# Patient Record
Sex: Male | Born: 1950
Health system: Southern US, Community
[De-identification: ages and names within clinical notes are randomized; demographics above are authoritative.]

## PROBLEM LIST (undated history)

## (undated) DIAGNOSIS — E785 Hyperlipidemia, unspecified: Secondary | ICD-10-CM

## (undated) DIAGNOSIS — J302 Other seasonal allergic rhinitis: Secondary | ICD-10-CM

## (undated) DIAGNOSIS — I739 Peripheral vascular disease, unspecified: Secondary | ICD-10-CM

## (undated) DIAGNOSIS — I219 Acute myocardial infarction, unspecified: Secondary | ICD-10-CM

## (undated) DIAGNOSIS — J449 Chronic obstructive pulmonary disease, unspecified: Secondary | ICD-10-CM

## (undated) DIAGNOSIS — Z972 Presence of dental prosthetic device (complete) (partial): Secondary | ICD-10-CM

## (undated) DIAGNOSIS — Z973 Presence of spectacles and contact lenses: Secondary | ICD-10-CM

## (undated) DIAGNOSIS — K219 Gastro-esophageal reflux disease without esophagitis: Secondary | ICD-10-CM

## (undated) DIAGNOSIS — M1990 Unspecified osteoarthritis, unspecified site: Secondary | ICD-10-CM

## (undated) DIAGNOSIS — Z9289 Personal history of other medical treatment: Secondary | ICD-10-CM

## (undated) DIAGNOSIS — I2510 Atherosclerotic heart disease of native coronary artery without angina pectoris: Secondary | ICD-10-CM

## (undated) DIAGNOSIS — R42 Dizziness and giddiness: Secondary | ICD-10-CM

## (undated) DIAGNOSIS — J40 Bronchitis, not specified as acute or chronic: Secondary | ICD-10-CM

## (undated) DIAGNOSIS — I10 Essential (primary) hypertension: Secondary | ICD-10-CM

## (undated) HISTORY — PX: ACNE CYST REMOVAL: SUR1112

## (undated) HISTORY — PX: TONSILLECTOMY: SUR1361

## (undated) HISTORY — PX: CARDIAC CATHETERIZATION: SHX172

## (undated) HISTORY — PX: MULTIPLE TOOTH EXTRACTIONS: SHX2053

## (undated) HISTORY — PX: KNEE ARTHROSCOPY: SHX127

## (undated) HISTORY — DX: Dizziness and giddiness: R42

## (undated) HISTORY — DX: Hyperlipidemia, unspecified: E78.5

## (undated) HISTORY — PX: CORONARY STENT PLACEMENT: SHX1402

## (undated) HISTORY — DX: Personal history of other medical treatment: Z92.89

## (undated) HISTORY — PX: COLONOSCOPY: SHX174

---

## 2000-10-01 ENCOUNTER — Encounter: Admission: RE | Admit: 2000-10-01 | Discharge: 2000-10-01 | Payer: Self-pay | Admitting: Family Medicine

## 2000-10-01 ENCOUNTER — Encounter: Payer: Self-pay | Admitting: Family Medicine

## 2000-12-10 ENCOUNTER — Ambulatory Visit (HOSPITAL_COMMUNITY): Admission: RE | Admit: 2000-12-10 | Discharge: 2000-12-11 | Payer: Self-pay | Admitting: Cardiovascular Disease

## 2002-04-11 ENCOUNTER — Emergency Department (HOSPITAL_COMMUNITY): Admission: EM | Admit: 2002-04-11 | Discharge: 2002-04-11 | Payer: Self-pay | Admitting: Emergency Medicine

## 2003-03-17 ENCOUNTER — Inpatient Hospital Stay (HOSPITAL_COMMUNITY): Admission: EM | Admit: 2003-03-17 | Discharge: 2003-03-20 | Payer: Self-pay | Admitting: Emergency Medicine

## 2003-03-17 ENCOUNTER — Encounter: Payer: Self-pay | Admitting: Emergency Medicine

## 2004-11-07 ENCOUNTER — Ambulatory Visit: Payer: Self-pay | Admitting: Cardiology

## 2004-12-29 ENCOUNTER — Ambulatory Visit: Payer: Self-pay | Admitting: Cardiology

## 2005-09-07 HISTORY — PX: CORONARY ARTERY BYPASS GRAFT: SHX141

## 2006-06-08 ENCOUNTER — Inpatient Hospital Stay (HOSPITAL_COMMUNITY): Admission: EM | Admit: 2006-06-08 | Discharge: 2006-06-16 | Payer: Self-pay | Admitting: Emergency Medicine

## 2006-06-08 ENCOUNTER — Ambulatory Visit: Payer: Self-pay | Admitting: Cardiology

## 2006-06-09 ENCOUNTER — Encounter: Payer: Self-pay | Admitting: Vascular Surgery

## 2006-06-10 ENCOUNTER — Encounter: Payer: Self-pay | Admitting: Vascular Surgery

## 2006-06-17 ENCOUNTER — Encounter: Admission: RE | Admit: 2006-06-17 | Discharge: 2006-06-17 | Payer: Self-pay | Admitting: Cardiothoracic Surgery

## 2006-07-02 ENCOUNTER — Ambulatory Visit: Payer: Self-pay | Admitting: Cardiology

## 2006-07-09 ENCOUNTER — Ambulatory Visit: Payer: Self-pay | Admitting: Cardiology

## 2006-08-05 ENCOUNTER — Encounter
Admission: RE | Admit: 2006-08-05 | Discharge: 2006-08-05 | Payer: Self-pay | Admitting: Thoracic Surgery (Cardiothoracic Vascular Surgery)

## 2006-08-13 ENCOUNTER — Ambulatory Visit: Payer: Self-pay | Admitting: Cardiology

## 2006-09-07 ENCOUNTER — Encounter (INDEPENDENT_AMBULATORY_CARE_PROVIDER_SITE_OTHER): Payer: Self-pay | Admitting: *Deleted

## 2006-10-08 ENCOUNTER — Ambulatory Visit: Payer: Self-pay | Admitting: Cardiology

## 2006-10-11 ENCOUNTER — Encounter: Payer: Self-pay | Admitting: Cardiology

## 2006-10-11 ENCOUNTER — Ambulatory Visit: Payer: Self-pay

## 2006-10-14 ENCOUNTER — Ambulatory Visit: Payer: Self-pay | Admitting: Internal Medicine

## 2006-11-11 ENCOUNTER — Ambulatory Visit: Payer: Self-pay | Admitting: Internal Medicine

## 2006-11-11 LAB — CONVERTED CEMR LAB
Creatinine, Ser: 0.9 mg/dL (ref 0.4–1.5)
GFR calc Af Amer: 113 mL/min
Potassium: 4.8 meq/L (ref 3.5–5.1)
Sodium: 139 meq/L (ref 135–145)

## 2007-01-26 ENCOUNTER — Ambulatory Visit: Payer: Self-pay | Admitting: Internal Medicine

## 2007-02-15 ENCOUNTER — Ambulatory Visit: Payer: Self-pay | Admitting: Internal Medicine

## 2007-04-15 ENCOUNTER — Ambulatory Visit: Payer: Self-pay | Admitting: Cardiology

## 2007-04-18 ENCOUNTER — Ambulatory Visit: Payer: Self-pay | Admitting: Cardiology

## 2007-04-18 LAB — CONVERTED CEMR LAB
AST: 24 units/L (ref 0–37)
Albumin: 3.7 g/dL (ref 3.5–5.2)
Alkaline Phosphatase: 97 units/L (ref 39–117)
BUN: 14 mg/dL (ref 6–23)
CO2: 27 meq/L (ref 19–32)
Chloride: 104 meq/L (ref 96–112)
Cholesterol: 169 mg/dL (ref 0–200)
Creatinine, Ser: 0.9 mg/dL (ref 0.4–1.5)
Total Bilirubin: 0.7 mg/dL (ref 0.3–1.2)
Total Protein: 7 g/dL (ref 6.0–8.3)
VLDL: 50 mg/dL — ABNORMAL HIGH (ref 0–40)

## 2007-10-05 ENCOUNTER — Encounter: Payer: Self-pay | Admitting: Internal Medicine

## 2007-10-25 ENCOUNTER — Emergency Department (HOSPITAL_COMMUNITY): Admission: EM | Admit: 2007-10-25 | Discharge: 2007-10-25 | Payer: Self-pay | Admitting: Emergency Medicine

## 2007-10-31 ENCOUNTER — Emergency Department (HOSPITAL_COMMUNITY): Admission: EM | Admit: 2007-10-31 | Discharge: 2007-10-31 | Payer: Self-pay | Admitting: Family Medicine

## 2008-08-20 ENCOUNTER — Ambulatory Visit: Payer: Self-pay | Admitting: Cardiology

## 2008-08-29 ENCOUNTER — Ambulatory Visit: Payer: Self-pay | Admitting: Cardiology

## 2008-08-29 LAB — CONVERTED CEMR LAB
Albumin: 3.3 g/dL — ABNORMAL LOW (ref 3.5–5.2)
Alkaline Phosphatase: 112 units/L (ref 39–117)
Bilirubin, Direct: 0.1 mg/dL (ref 0.0–0.3)
GFR calc Af Amer: 112 mL/min
GFR calc non Af Amer: 93 mL/min
LDL Cholesterol: 87 mg/dL (ref 0–99)
Potassium: 4.4 meq/L (ref 3.5–5.1)
Sodium: 140 meq/L (ref 135–145)
Total Bilirubin: 0.5 mg/dL (ref 0.3–1.2)
Total CHOL/HDL Ratio: 4.7
VLDL: 33 mg/dL (ref 0–40)

## 2008-09-05 ENCOUNTER — Emergency Department (HOSPITAL_COMMUNITY): Admission: EM | Admit: 2008-09-05 | Discharge: 2008-09-05 | Payer: Self-pay | Admitting: Emergency Medicine

## 2008-12-27 ENCOUNTER — Ambulatory Visit: Payer: Self-pay | Admitting: Cardiology

## 2008-12-27 ENCOUNTER — Telehealth: Payer: Self-pay | Admitting: Cardiology

## 2009-01-04 ENCOUNTER — Encounter (INDEPENDENT_AMBULATORY_CARE_PROVIDER_SITE_OTHER): Payer: Self-pay | Admitting: *Deleted

## 2009-01-04 LAB — CONVERTED CEMR LAB
ALT: 61 units/L — ABNORMAL HIGH (ref 0–53)
HDL: 37 mg/dL — ABNORMAL LOW (ref >39.00)
LDL Cholesterol: 87 mg/dL (ref 0–99)
Total Bilirubin: 0.7 mg/dL (ref 0.3–1.2)
Total CHOL/HDL Ratio: 4
VLDL: 32 mg/dL (ref 0.0–40.0)

## 2009-04-18 ENCOUNTER — Telehealth: Payer: Self-pay | Admitting: Cardiology

## 2009-06-03 ENCOUNTER — Encounter (INDEPENDENT_AMBULATORY_CARE_PROVIDER_SITE_OTHER): Payer: Self-pay | Admitting: *Deleted

## 2009-08-12 DIAGNOSIS — I219 Acute myocardial infarction, unspecified: Secondary | ICD-10-CM

## 2009-08-12 DIAGNOSIS — E785 Hyperlipidemia, unspecified: Secondary | ICD-10-CM | POA: Insufficient documentation

## 2009-08-12 DIAGNOSIS — R55 Syncope and collapse: Secondary | ICD-10-CM | POA: Insufficient documentation

## 2009-08-12 DIAGNOSIS — I10 Essential (primary) hypertension: Secondary | ICD-10-CM | POA: Insufficient documentation

## 2009-08-12 DIAGNOSIS — I495 Sick sinus syndrome: Secondary | ICD-10-CM | POA: Insufficient documentation

## 2009-08-12 DIAGNOSIS — G4700 Insomnia, unspecified: Secondary | ICD-10-CM

## 2009-08-12 DIAGNOSIS — I2510 Atherosclerotic heart disease of native coronary artery without angina pectoris: Secondary | ICD-10-CM

## 2009-08-12 DIAGNOSIS — I2589 Other forms of chronic ischemic heart disease: Secondary | ICD-10-CM | POA: Insufficient documentation

## 2009-08-12 DIAGNOSIS — Z9861 Coronary angioplasty status: Secondary | ICD-10-CM

## 2009-08-15 ENCOUNTER — Ambulatory Visit: Payer: Self-pay | Admitting: Cardiology

## 2009-08-15 DIAGNOSIS — F17200 Nicotine dependence, unspecified, uncomplicated: Secondary | ICD-10-CM

## 2010-01-03 ENCOUNTER — Telehealth (INDEPENDENT_AMBULATORY_CARE_PROVIDER_SITE_OTHER): Payer: Self-pay | Admitting: *Deleted

## 2010-08-02 ENCOUNTER — Emergency Department (HOSPITAL_COMMUNITY): Admission: EM | Admit: 2010-08-02 | Discharge: 2010-08-02 | Payer: Self-pay | Admitting: Family Medicine

## 2010-09-04 ENCOUNTER — Encounter: Payer: Self-pay | Admitting: Cardiology

## 2010-09-04 ENCOUNTER — Ambulatory Visit: Payer: Self-pay | Admitting: Cardiology

## 2010-09-04 DIAGNOSIS — I739 Peripheral vascular disease, unspecified: Secondary | ICD-10-CM | POA: Insufficient documentation

## 2010-09-22 ENCOUNTER — Ambulatory Visit
Admission: RE | Admit: 2010-09-22 | Discharge: 2010-09-22 | Payer: Self-pay | Source: Home / Self Care | Attending: Cardiology | Admitting: Cardiology

## 2010-09-22 ENCOUNTER — Other Ambulatory Visit: Payer: Self-pay | Admitting: Cardiology

## 2010-09-22 ENCOUNTER — Ambulatory Visit: Admission: RE | Admit: 2010-09-22 | Discharge: 2010-09-22 | Payer: Self-pay | Source: Home / Self Care

## 2010-09-22 ENCOUNTER — Encounter: Payer: Self-pay | Admitting: Cardiology

## 2010-09-22 LAB — BASIC METABOLIC PANEL
BUN: 22 mg/dL (ref 6–23)
CO2: 28 mEq/L (ref 19–32)
Calcium: 9.1 mg/dL (ref 8.4–10.5)
Chloride: 101 mEq/L (ref 96–112)
Creatinine, Ser: 1 mg/dL (ref 0.4–1.5)
GFR: 79.65 mL/min (ref >60.00)
Glucose, Bld: 99 mg/dL (ref 70–99)
Potassium: 4.4 mEq/L (ref 3.5–5.1)
Sodium: 137 mEq/L (ref 135–145)

## 2010-09-22 LAB — HEPATIC FUNCTION PANEL
ALT: 44 U/L (ref 0–53)
AST: 32 U/L (ref 0–37)
Albumin: 3.7 g/dL (ref 3.5–5.2)
Alkaline Phosphatase: 95 U/L (ref 39–117)
Bilirubin, Direct: 0 mg/dL (ref 0.0–0.3)
Total Bilirubin: 0.7 mg/dL (ref 0.3–1.2)
Total Protein: 7 g/dL (ref 6.0–8.3)

## 2010-09-22 LAB — LIPID PANEL
Cholesterol: 183 mg/dL (ref 0–200)
HDL: 41 mg/dL (ref >39.00)
LDL Cholesterol: 106 mg/dL — ABNORMAL HIGH (ref 0–99)
Total CHOL/HDL Ratio: 4
Triglycerides: 180 mg/dL — ABNORMAL HIGH (ref 0.0–149.0)
VLDL: 36 mg/dL (ref 0.0–40.0)

## 2010-09-28 ENCOUNTER — Encounter: Payer: Self-pay | Admitting: Thoracic Surgery (Cardiothoracic Vascular Surgery)

## 2010-10-07 NOTE — Progress Notes (Signed)
Summary: pt needs refill  Phone Note Refill Request Message from:  Patient on [facility] [address]  Refills Requested: Medication #1:  METOPROLOL TARTRATE 25 MG TABS 1 by mouth daily Initial call taken by: Omer Jack,  January 03, 2010 10:10 AM  Follow-up for Phone Call        Rx faxed to pharmacy [facility] [address] Follow-up by: Oswald Hillock,  January 03, 2010 10:15 AM    Prescriptions: METOPROLOL TARTRATE 25 MG TABS (METOPROLOL TARTRATE) 1 by mouth daily  #30 x 12   Entered by:   Oswald Hillock   Authorized by:   Ferman Hamming, MD, [facility]   Signed by:   Oswald Hillock on 01/03/2010   Method used:   Faxed to ...       [facility] [address] [phone]* (retail)       [address]       [place], [place]  [address]       Ph: [phone]       Fax: [phone]   RxID:   [id number]

## 2010-10-07 NOTE — Letter (Signed)
Summary: [facility] Survey   Imported By: Lenard Forth 05/09/2010 12:55:07  _____________________________________________________________________  External Attachment:    Type:   Image     Comment:   External Document

## 2010-10-09 NOTE — Assessment & Plan Note (Signed)
Summary: yearly .Marland Kitchen/cy   CC:  yearly visit.  History of Present Illness: [name] is a gentleman who has a history of coronary disease.  In October 2007, he had a LIMA to the LAD, sequential saphenous vein graft to the intermediate and obtuse marginal, and a saphenous vein graft to the PDA.  His last echocardiogram in February 2008 showed preserved LV function. Carotid Dopplers in April of 2005 showed normal arteries. I last saw him in December of 2010. Since then the patient denies any dyspnea on exertion, orthopnea, PND, pedal edema, palpitations, syncope or chest pain. He does note pain on the dorsal aspects of his feet with ambulation. It did improve with shoe supports.   Current Medications (verified): 1)  Omeprazole 20 Mg  Cpdr (Omeprazole) .... Take 1 Tablet By Mouth Two Times A Day 2)  Lipitor 80 Mg Tabs (Atorvastatin Calcium) .Marland Kitchen.. 1 Tab By Mouth Once Daily 3)  Metoprolol Tartrate 25 Mg Tabs (Metoprolol Tartrate) .Marland Kitchen.. 1 By Mouth Daily 4)  Aspirin 81 Mg  Tabs (Aspirin) .Marland Kitchen.. 1 By Mouth Daily 5)  Zyrtec Allergy 10 Mg Tabs (Cetirizine Hcl) .Marland Kitchen.. 1 Tab By Mouth Once Daily  Allergies: 1)  ! 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  Erectile dysfunction  History of noncompliance.  Past Surgical History: Reviewed history from 08/15/2009 and no changes required. Coronary artery bypassing graft in October of 2007 with a LIMA to the LAD, saphenous vein graft to the ramus intermedius with a sequential graft to the circumflex marginal, subsequent graft to the PDA Right knee arthroscopy Tonsillectomy Adenoidectomy  Social History: Reviewed history from 08/12/2009 and no changes required.  The patient lives in [place] with his wife.  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  He has a greater than  60 pack-year  history of smoking.  He has tried to quit multiple times, all  of which have been unsuccessful.  He drinks several beers on occasion.  His  diet is extremely poor while traveling, and he frequently eats fast food.  Review of Systems       no fevers or chills, productive cough, hemoptysis, dysphasia, odynophagia, melena, hematochezia, dysuria, hematuria, rash, seizure activity, orthopnea, PND, pedal edema, claudication. Remaining systems are negative.   Vital Signs:  Patient profile:   60 year old male Height:      70 inches Weight:      320 pounds BMI:     46.08 Pulse rate:   61 / minute Resp:     16 per minute BP sitting:   114 / 69  (left arm)  Vitals Entered By: Kem Parkinson (September 04, 2010 2:19 PM)  Physical Exam  General:  Well-developed well-nourished in no acute distress.  Skin is warm and dry.  HEENT is normal.  Neck is supple. No thyromegaly.  Chest is clear to auscultation with normal expansion.  Cardiovascular exam is regular rate and rhythm.  Abdominal exam nontender or distended. No masses palpated. Extremities show no edema. neuro grossly intact    EKG  Procedure date:  09/04/2010  Findings:      Sinus rhythm at a rate of 61. No ST changes.  Impression & Recommendations:  Problem # 1:  TOBACCO ABUSE (ICD-305.1) Patient counseled on discontinuing.  Problem # 2:  CLAUDICATION (ICD-443.9)  Doubt claudication but will check ABIs with Dopplers.  Orders: Arterial Duplex Lower Extremity (Arterial Duplex Low)  Problem # 3:  HYPERLIPIDEMIA (ICD-272.4)  Check lipids and liver. Continue statin. The following medications were removed from the medication list:    Niaspan 1000 Mg Cr-tabs (Niacin (antihyperlipidemic)) ..... Out His updated medication list for this problem includes:    Lipitor 80 Mg Tabs (Atorvastatin calcium) .Marland Kitchen... 1 tab by mouth once daily  The following medications were removed from the medication list:    Niaspan 1000 Mg Cr-tabs  (Niacin (antihyperlipidemic)) ..... Out His updated medication list for this problem includes:    Lipitor 80 Mg Tabs (Atorvastatin calcium) .Marland Kitchen... 1 tab by mouth once daily  Problem # 4:  HYPERTENSION (ICD-401.9) Blood pressure controlled on present medications. Will continue. Check potassium and renal function. The following medications were removed from the medication list:    Benazepril Hcl 20 Mg Tabs (Benazepril hcl) .Marland Kitchen... 1 by mouth daily His updated medication list for this problem includes:    Metoprolol Tartrate 25 Mg Tabs (Metoprolol tartrate) .Marland Kitchen... 1 by mouth daily    Aspirin 81 Mg Tabs (Aspirin) .Marland Kitchen... 1 by mouth daily  Problem # 5:  CAD (ICD-414.00) Continue aspirin, beta blocker, ACE inhibitor and statin. The following medications were removed from the medication list:    Benazepril Hcl 20 Mg Tabs (Benazepril hcl) .Marland Kitchen... 1 by mouth daily His updated medication list for this problem includes:    Metoprolol Tartrate 25 Mg Tabs (Metoprolol tartrate) .Marland Kitchen... 1 by mouth daily    Aspirin 81 Mg Tabs (Aspirin) .Marland Kitchen... 1 by mouth daily  Patient Instructions: 1)  Your physician recommends that you return for lab work YQ:MVHQ DOPPLERS 2)  Your physician wants you to follow-up in:   You will receive a reminder letter in the mail two months in advance. If you don't receive a letter, please call our office to schedule the follow-up appointment. 3)  Your physician has requested that you have a lower extremity arterial duplex.  This test is an ultrasound of the arteries in the legs or arms.  It looks at arterial blood flow in the legs and arms.  Allow one hour for Lower and Upper Arterial scans. There are no restrictions or special instructions.

## 2011-01-20 NOTE — Assessment & Plan Note (Signed)
[facility]                            CARDIOLOGY OFFICE NOTE   NAME:Rohman, YULIAN GOSNEY                      MRN:          [id number]  DATE:08/20/2008                            DOB:          Apr 29, 1951    Mr. Ludvigsen is a gentleman who has a history of coronary disease.  In  October 2007, he had a LIMA to the LAD, sequential saphenous vein graft  to the intermediate and obtuse marginal, and a saphenous vein graft to  the PDA.  His last echocardiogram in February 2008 showed preserved LV  function.  I have not seen him since August 2008.  Since that time, he  denies any increased dyspnea on exertion, orthopnea, PND, pedal edema,  palpitations, presyncope, syncope, or exertional chest pain.  He has had  recent URI.  He is continuing to smoke.   MEDICATIONS:  1. Aspirin 81 mg p.o. daily.  2. Lopressor 25 mg p.o. b.i.d.  3. Lipitor 80 mg p.o. daily.  4. Multivitamin.  5. Omeprazole.  6. Niaspan 1 g p.o. nightly.   PHYSICAL EXAMINATION:  VITAL SIGNS:  Today shows a blood pressure of  146/90 and his pulse of 97.  He weighs 227 pounds.  HEENT:  Normal.  NECK:  Supple with no bruits.  CHEST:  Clear.  CARDIOVASCULAR:  Regular rate and rhythm.  ABDOMEN:  No tenderness and there is no bruit noted.  I cannot  appreciate a pulsatile mass.  EXTREMITIES:  No edema.   Electrocardiogram shows a sinus rhythm at a rate of 97.  The axis is  normal.  There are nonspecific ST changes.  A prior inferior infarct  cannot be excluded.   DIAGNOSES:  1. Coronary artery disease status post coronary artery bypassing graft      - Mr. Viviano has not had chest pain or shortness of breath.  We will      continue with medical therapy including his aspirin, statin, and      beta-blocker.  I will resume his ACE inhibitor both for his      vascular disease and his blood pressure.  2. Hypertension - His blood pressure is elevated today, but he has      been out of his benazepril  for approximately one month.  We will      resume at 20 mg p.o. daily.  In one week, I will have him return      for a BMET to follow his potassium and renal function.  3. Hyperlipidemia - He will continue on his Lipitor and Niaspan.  We      will check lipids and liver and adjust with a goal LDL of less than      70.  4. History of thrombocytopenia with Integrilin.  5. Tobacco abuse - The patient is smoking 3-4 packs of cigarettes per      week by his report.  I discussed the importance of discontinuing      this for between 3-10 minutes.   He will also need to continue with diet and exercise.  We will see him  back in 1 year or sooner if necessary.     Madolyn Frieze [name], MD, [facility]  Electronically Signed    BSC/MedQ  DD: 08/20/2008  DT: 08/21/2008  Job #: [id number]

## 2011-01-20 NOTE — Assessment & Plan Note (Signed)
[facility]                            CARDIOLOGY OFFICE NOTE   NAME:[name]                      MRN:          [id number]  DATE:04/15/2007                            DOB:          10-14-1950    Mr. [name] is a pleasant gentleman who has a history of coronary disease.  He underwent coronary artery bypass grafting in October of 2007.  At  that time, he had a LIMA to the LAD, sequential saphenous vein graft to  the intermediate and obtuse marginal, and a saphenous vein graft to the  PDA.  His most recent echocardiogram was performed on October 11, 2006.  His ejection fraction was felt to be normal.  There was mild left atrial  enlargement.  Since I last saw him, he is doing well.  He discontinued  his tobacco use in November.  He denies any dyspnea, chest pain,  palpitations, or syncope, and there is no pedal edema.   MEDICATIONS:  1. Aspirin 81 mg p.o. daily.  2. Lopressor 25 mg p.o. b.i.d.  3. Lipitor 80 mg p.o. daily.  4. Multivitamin.  5. Benazepril 10 mg p.o. daily.  6. Omeprazole 20 mg p.o. daily.   PHYSICAL EXAM:  Blood pressure 144/83, pulse 92.  He weighs 225 pounds.  HEENT:  Normal.  NECK:  Supple with no bruits.  CHEST:  Clear.  CARDIOVASCULAR:  Regular rate and rhythm.  ABDOMEN:  Benign.  EXTREMITIES:  No edema.   ELECTROCARDIOGRAM:  Sinus rhythm at a rate of 87.  There is small  inferior Q waves and a prior inferior infarct cannot be excluded.   DIAGNOSES:  1. Coronary artery disease status post coronary artery bypass grafting      - the patient has had no chest pain or shortness of breath and we      will continue with medical therapy, including his aspirin, beta      blocker, ACE inhibitor, and Statin.  2. Ischemic cardiomyopathy - improved by most recent echocardiogram.  3. Hypertension - we will continue with his present blood pressure      medicines.  I will check a BMET today to follow his potassium and      renal  function.  4. Hyperlipidemia - we will check lipids and liver and adjust with a      goal LDL less than 70.  5. History of thrombocytopenia with integralin.   We discussed risk factor modification today.  He has discontinued his  tobacco use.  We discussed the importance of diet and exercise.  I will  see him back in 1 year.     Madolyn Frieze [name], MD, [facility]  Electronically Signed    BSC/MedQ  DD: 04/15/2007  DT: 04/16/2007  Job #: [id number]

## 2011-01-20 NOTE — Assessment & Plan Note (Signed)
[facility]                         GASTROENTEROLOGY OFFICE NOTE   NAME:[name]                      MRN:          [id number]  DATE:01/26/2007                            DOB:          [birth year]    The patient presented to schedule colonoscopy.  He was routed to me  though he could have seen our nurses for a typical pre visit.  There was  no charge by me for this visit.   He is here for a screening colonoscopy.  I have discussed the risks,  benefits, and indications.   PAST MEDICAL HISTORY:  1. Coronary artery disease with coronary artery bypass grafting,      October, 2007.  2. Hypertension.  3. Dyslipidemia.  4. Prior smoker.  5. Chronic insomnia.  6. Erectile dysfunction.   DRUG ALLERGIES:  CODEINE AND CHANTIX CAUSE NAUSEA.   MEDICATIONS:  1. Aspirin 81 mg daily.  2. Lopressor 25 mg b.i.d.  3. Lipitor 80 mg daily.  4. Vitamins.  5. Benazepril 10 mg daily.  6. Omeprazole 20 mg daily.  7. Ultram p.r.n.  8. Mucinex p.r.n.  9. Zyrtec p.r.n.  10.Tums p.r.n.     Iva Boop, MD,FACG  Electronically Signed    CEG/MedQ  DD: 01/26/2007  DT: 01/26/2007  Job #: [id number]

## 2011-01-21 ENCOUNTER — Other Ambulatory Visit: Payer: Self-pay | Admitting: Cardiology

## 2011-01-22 MED ORDER — METOPROLOL TARTRATE 25 MG PO TABS: 25.0000 mg | ORAL_TABLET | Freq: Two times a day (BID) | ORAL | Status: DC

## 2011-01-22 MED ORDER — ATORVASTATIN CALCIUM 80 MG PO TABS: 80.0000 mg | ORAL_TABLET | Freq: Every day | ORAL | Status: DC

## 2011-01-22 NOTE — Telephone Encounter (Signed)
Pt called today regarding pres that phar called about yesterday.  He has switched from [facility] to [facility]. He would like a 90 day supply.  Pt is out of Lipitor and has a few Metoprolol left.  Please call this in for him.

## 2011-01-22 NOTE — Telephone Encounter (Signed)
RX sent into [facility] 90 day supply. Pt notified.
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
## 2011-01-23 NOTE — Cardiovascular Report (Signed)
. [facility]  Patient:    JAYVAN, MCSHAN                      MRN: [id number] Proc. Date: 12/10/00 Adm. Date:  [id number] Attending:  Koren Bound CC:         Clovis Pu Patty Sermons, M.D.  Elvina Sidle, M.D.  Cardiac Catheterization Laboratory   Cardiac Catheterization  INDICATIONS:  Mr. Moring is a 60 year old gentleman with a history of exertional chest pain.  He recently had a stress Cardiolite study which revealed anterior wall ischemia.  He is referred for heart catheterization for further evaluation.  PROCEDURES:  Left heart catheterization with coronary angiography with percutaneous transluminal coronary angioplasty and stenting of his left anterior descending artery with percutaneous transluminal coronary angioplasty of the second diagonal vessel.  DESCRIPTION OF PROCEDURE:  The right femoral artery was easily cannulated using the modified Seldinger technique.  HEMODYNAMICS:  The left ventricular pressure is 141/21 with an aortic pressure of 139/64.  ANGIOGRAPHY:  The left main coronary artery is relatively smooth and normal.  The left anterior descending artery has a proximal 50-60% stenosis.  This is followed by a long stenosis that is diffusely diseased and at its tightest point is about 95% occluded.  This lesion lies between the first and second diagonal vessels.  The remainder of the LAD has minor luminal irregularities. There are mild irregularities in the first and second diagonal vessels.  The left circumflex artery is a moderate sized vessel with minor luminal irregularities.  The right coronary artery is a large and dominant vessel.  There is a proximal 60-70% stenosis.  The vessel is mildly and diffusely diseased throughout its course.  The posterior descending artery and the posterolateral segment artery are unremarkable.  LEFT VENTRICULOGRAM:  The left ventriculogram was performed in a 30 RAO position.   It reveals overall normal left ventricular systolic function.  The ejection fraction is around 65%.  There are no segmental wall motion abnormalities.  PERCUTANEOUS TRANSLUMINAL CORONARY ANGIOPLASTY AND STENTING PROCEDURE:  The 6 French sheath was traded out for a 7 [place] sheath.  The left main was engaged using a 7 [place], Judkins left 4 guide.  A total of 4800 units of heparin were given followed by a double bolus Integrilin drip.  There was some question if the first bolus actually got into the patient because of an IV problem.  The second bolus was given fairly soon after the first bolus.  The ACT was 238.  The left anterior descending artery was wired using the [facility] 0.014 angioplasty wire.  This was followed by a 3.0 x 20 mm CrossSail balloon.  It was inflated in the proximal and mid LAD up to 8 atmospheres for 46 seconds. This resulted in a marked improved vessel lumen.  This balloon was removed and a 3.0 x 23 mm Penta stent was placed because it was positioned across the proximal and mid LAD with overlap including the first and second diagonal vessels.  It was deployed at 12 atmospheres for 26 seconds.  This resulted in marked improvement of the vessel lumen with a 0% residual stenosis.  There was some compromise of the second diagonal vessel.  The wire was pulled back and was positioned in the second diagonal vessel.  The 3.0 x 20 mm CrossSail would not cross into the second diagonal vessel, so this balloon was removed.  A 2.5 x 15 mm Maverick finally was positioned  across the lesion.  One inflation up to 4 atmospheres was performed.  This improved the ostial stenosis to some degree.  There is still some compromise of the vessel lumen but the flow was quite brisk and it appears that some of this is due to coronary spasm.  COMPLICATIONS:  None.  CONCLUSIONS: 1. Successful percutaneous transluminal coronary angioplasty and stenting    of the left anterior descending  artery. 2. Normal left ventricular systolic function.  We will address the right coronary artery lesion at a later date. DD:  12/10/00 TD:  12/10/00 Job: [id number]

## 2011-01-23 NOTE — Discharge Summary (Signed)
NAMEVENCIL, BASNETT               ACCOUNT NO.:  [id number]   MEDICAL RECORD NO.:  [id number]          PATIENT TYPE:  INP   LOCATION:  2039                         FACILITY:  [facility]   PHYSICIAN:  Salvatore Decent. Cornelius Moras, M.D. DATE OF BIRTH:  Oct 25, 1950   DATE OF ADMISSION:  06/07/2006  DATE OF DISCHARGE:  06/16/2006                                 DISCHARGE SUMMARY   HISTORY OF PRESENT ILLNESS:  The patient is a 60 year old male with known  coronary artery disease.  He underwent previous PCI stenting of the LAD in  2002.  Then, he had PCI stenting in the setting of an inferior myocardial  infarction in March 2004 with multiple TAXUS stents in [place].  He then  suffered in-stent thrombosis on March 17, 2003, of the right coronary artery,  and had a re-intervention at that site.  Catheterization at that time showed  ejection fraction to be 50%, and then there was also the finding of a  diagonal #2 lesion, as well as 30% left main.  The patient has been employed  for extended periods of time in the [place] and [place] area.  He was in  [place] for the past 2 months, and on June 04, 2006, while finishing  working an 18-hour shift, he lay down to get some rest and had the onset of  chest pressure and burning.  This was associated with shortness of breath  and was moderate in severity.  Initially, he felt that it was similar to his  reflux symptoms, but it was higher in his chest.  He said it felt distinctly  different from his previous myocardial infarction.  There was no radiation,  nausea, vomiting or palpitations.  It lasted a day, and ultimately he  presented to the emergency department in [place], [place].  They stabilized  him on nitroglycerin, and his troponins were positive, but he had no ST  elevations.  Of note, the patient had been noncompliant with all of his  medications for the previous 2 months.  On the following day, June 05, 2006, he was taken to the cardiac cath lab,  where he had significant left  main stenosis of 50%.  The LAD had extensive stented portion with 70%  stenosis in the distal stented region and diffuse in-stent stenosis.  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  There  was a tiny diagonal #1 that was subtotally occluded.  The left circumflex  had an 80-90% lesion.  A large obtuse marginal had a 95% lesion.  The right  coronary artery had a diffuse in-stent 70-80% stenosis.  There was also a  distal posterior lateral branch lesion.  The right coronary artery had  diffuse in-stent stenosis of 70-80% severity.  There was also a distal  posterior lateral branch off the right coronary artery with a 50% lesion.  Ejection fraction was 40% with inferior hypokinesis.  Coronary artery bypass  grafting was recommended.  The patient was stable without further chest  pain, and the patient requested to be treated in [place].  His physicians  in [place] recommended against this transfer given his extensive coronary  disease, and the patient left against medical advice.  He took a flight from  [place] and presented on June 08, 2006, directly to the emergency  department.  He was pain free and stable at that time.  He was admitted for  further evaluation and treatment, and consultation was obtained with  Tressie Stalker, MD, for surgical opinion for proceeding with the surgical  revascularization.   PAST MEDICAL HISTORY:  1. Coronary artery disease as described above.  2. Sinus bradycardia, on beta blocker.  3. Hyperlipidemia.  4. Erectile dysfunction.  5. Medical noncompliance.  6. Possible claudication.  7. Ongoing tobacco abuse.  8. Tonsillectomy and adenoidectomy as a child.   ALLERGIES:  1. CODEINE causes nausea.  2. CHANTIX causes nausea.   MEDICATIONS PRIOR TO ADMISSION:  None.  He had previously been prescribed  Lipitor 80 mg daily; Toprol XL 25 mg daily; aspirin 81 mg daily; Plavix 75  mg daily, but  he did not take these in the previous 2 months.   FAMILY HISTORY, SOCIAL HISTORY, REVIEW OF SYSTEMS AND PHYSICAL EXAM:  Please  see the history and physical done at time of admission.   HOSPITAL COURSE:  The patient was admitted.  The catheterizations were  reviewed by both Cardiology and Dr. Cornelius Moras.  He was felt to be a candidate for  proceeding with surgical intervention, and the procedure was scheduled.   PROCEDURE:  On June 11, 2006, he was taken to the operating room, at which  time he underwent the following procedure:  Coronary artery bypass grafting  x4.  The following grafts were placed:  1. Left internal mammary artery to the LAD.  2. Sequential saphenous vein graft to the intermediate and obtuse      marginal.  3. Saphenous vein graft to the posterior descending.   The patient tolerated the procedure well and was taken to the Surgical  Intensive Care Unit in stable condition.   POSTOPERATIVE HOSPITAL COURSE:  The patient has done well.  He has required  aggressive pulmonary toilet, but is responding well with this, as well as  advancing his activity using routine cardiac rehabilitation phase 1  modalities.  All routine lines, monitors and drains have been discontinued  in the standard fashion.  He has maintained normal sinus rhythm throughout.  His incisions are all healing well without evidence of infection.  His lower  portion of the sternal incision did have some drainage, but this appears to  be dissipating.  He has been fluid overloaded, but is responding well to  diuretic.  His overall status is felt to be stable for tentative discharge  in the morning of June 16, 2006, pending morning round reevaluation.   MEDICATIONS ON DISCHARGE:  1. Aspirin 81 mg daily.  2. Lopressor 25 mg twice daily.  3. Altace 2.5 mg daily.  4. Lipitor 80 mg daily.  5. Lasix 40 mg daily for 5 days.  6. K-Dur 20 mEq daily for 5 days.  7. For pain, Ultram 50 mg 1 or 2 every 6 hours as  needed.  INSTRUCTIONS:  The patient will receive written instructions regarding  medications, activity, diet, wound care and followup.  Followup will include  Dr. [name] in 2 weeks.  Dr. Cornelius Moras on July 05, 2006, at noon.   FINAL DIAGNOSIS:  Severe 3-vessel coronary artery disease status post acute  non-Q wave myocardial infarction with previous history of percutaneous  procedures as described above, now status post surgical revascularization as  described above.   OTHER DIAGNOSES:  1. Postoperative anemia, which is stable.  Most recent hemoglobin and      hematocrit dated June 14, 2006, are 9.2 and 26.8, respectively.  2. Postoperative volume overload, improving.  3. Postoperative sternal wound drainage, improving.  4. Hyperlipidemia.  5. Erectile dysfunction.  6. History of medical noncompliance.  7. 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  8. History of tobacco abuse, ongoing.  9. History of tonsillectomy and adenoidectomy as a child.      Rowe Clack, P.A.-C.      Salvatore Decent. Cornelius Moras, M.D.  Electronically Signed    WEG/MEDQ  D:  06/15/2006  T:  06/16/2006  Job:  [id number]   cc:   Madolyn Frieze. [name], MD, [facility]  [facility]

## 2011-01-23 NOTE — Op Note (Signed)
[name], [name]               ACCOUNT NO.:  [id number]   MEDICAL RECORD NO.:  [id number]          PATIENT TYPE:  INP   LOCATION:  2399                         FACILITY:  [facility]   PHYSICIAN:  Salvatore Decent. Cornelius Moras, M.D. DATE OF BIRTH:  [birth year]   DATE OF PROCEDURE:  06/11/2006  DATE OF DISCHARGE:                                 OPERATIVE REPORT   PREOPERATIVE DIAGNOSIS:  Severe three-vessel coronary artery disease, status  post acute non-Q-wave myocardial infarction.   POSTOPERATIVE DIAGNOSIS:  Severe three-vessel coronary artery disease,  status post acute non-Q-wave myocardial infarction.   PROCEDURE:  Median sternotomy for coronary artery bypass grafting x4 (left  internal mammary artery to distal left anterior descending coronary artery,  saphenous vein graft to ramus intermediate branch with sequential saphenous  vein graft to first circumflex marginal branch, saphenous vein graft to  posterior descending coronary artery, endoscopic saphenous vein harvest from  right thigh).   SURGEON:  Purcell Nails, M.D.   ASSISTANT:  Rowe Clack, P.A.-C.   ANESTHESIA:  General.   BRIEF CLINICAL NOTE:  The patient is a 60 year old male with known history  of coronary artery disease, hyperlipidemia, longstanding tobacco abuse, and  a strong family history of coronary artery disease.  The patient has  suffered at least three previous myocardial infarctions and had undergone  multiple percutaneous coronary interventions in the past.  He has done well  over recent months and years, although he has been lost to followup by his  primary cardiologist, and he stopped taking all of his medications more than  2 months ago.  While traveling in [place], the patient suffered an acute  non-Q-wave myocardial infarction.  He was hospitalized briefly there and  underwent cardiac catheterization demonstrating severe three-vessel coronary  artery disease with preserved left ventricular function.   He was told that  he should undergo coronary artery bypass surgery at that time.  He left the  hospital against medical advice and subsequently returned to [facility].  He  was admitted to the hospital here and now desires to proceed with surgical  revascularization as described.   OPERATIVE CONSENT:  The patient has been counseled at length regarding the  indications, risks, and potential benefits of surgery.  Alternative  treatment strategies have been discussed.  He understands and accepts all  associated risks of surgery and desires to proceed as described.   OPERATIVE FINDINGS:  1. Mild left ventricular dysfunction, with ejection fraction estimated at      50%.  2. Good-quality left internal mammary artery and saphenous vein conduit      for grafting.  3. Good-quality target vessels for grafting.  4. Diagonal branch of the left anterior descending coronary artery too      small, diffusely diseased, and chronically occluded, not a suitable      target for grafting.   OPERATIVE NOTE IN DETAIL:  The patient is brought to the operating room on  the above-mentioned date, and central monitoring is established by the  anesthesia service under the care and direction of Dr. Quita Skye. Krista Blue.  Specifically, a  Swan-Ganz catheter is placed through the right internal  jugular approach.  A radial arterial line is placed.  Intravenous  antibiotics are administered.  Following induction with general endotracheal  anesthesia, a Foley catheter is placed.  The patient's chest, abdomen, both  groins, and both lower extremities are prepared and draped in a sterile  manner.  Baseline transesophageal echocardiogram is performed by Dr. Krista Blue.  This demonstrates mild left ventricular dysfunction.  No significant wall  motion abnormalities are noted.   A median sternotomy incision is performed, and the left internal mammary  artery is dissected from the chest wall and prepared for bypass grafting.  The  left internal mammary artery is a good-quality conduit.  Simultaneously,  saphenous vein is obtained the patient's right thigh and the upper portion  of the right lower leg using endoscopic vein harvest technique.  The  saphenous vein is a good-quality conduit.  After the saphenous vein is  removed, the  small surgical incision in the right lower leg was closed in  multiple layers with running absorbable suture.  The patient is heparinized  systemically.  The left internal mammary artery is transected distally and  is noted to have excellent flow.   The pericardium is opened.  The ascending aorta is normal in appearance.  The ascending aorta and the right atrium are cannulated for cardiopulmonary  bypass.  Cardiopulmonary bypass is begun, and the surface of the heart is  inspected.  Distal sites are selected for coronary bypass grafting.  The  diagonal branch off the left anterior descending coronary artery is too  small and diffusely diseased for grafting.  A temperature probe is placed in  the left ventricular septum.  A cardioplegia catheter is placed in the  ascending aorta.   The patient is allowed to cool passively to 32 degrees systemic temperature.  The aortic crossclamp is applied, and cold blood cardioplegia is  administered in an antegrade fashion through the aortic root.  Iced saline  slush is applied for topical hypothermia.  The initial cardioplegic arrest  and myocardial cooling are felt to be excellent.  Repeat doses of  cardioplegia are administered intermittently throughout the crossclamp  portion of the operation through the aortic root, down subsequently placed  vein grafts, to maintain left ventricular septal temperature below 15  degrees centigrade.   The following distal coronary anastomoses are performed:  1. The posterior descending coronary artery is grafted with a saphenous     vein graft in an end-to-side fashion.  This vessel measures 2 mm in      diameter  and is a good-quality target vessel for grafting.  2. The ramus intermediate branch is grafted with a saphenous vein graft in      a side-to-side fashion.  This vessel measures 2.0 mm in diameter and is      a good-quality target vessel for grafting.  3. The first circumflex marginal branch is grafted using a sequential      saphenous vein graft off of the vein placed in the ramus intermediate      branch.  This vessel measures 1.5 mm in diameter and is a fair to good-      quality target vessels for grafting.  4. The distal left anterior descending coronary artery is grafted with the      left internal mammary artery in end-to-side fashion.  This vessel      measured 1.5 mm in diameter and is a good-quality target vessel for  grafting.   Both proximal saphenous vein anastomoses are performed directly to the  ascending aorta prior to removal of the aortic crossclamp.  The left  ventricular septal temperature rises rapidly with reperfusion of the left  internal mammary artery.  The aortic crossclamp is removed after a total  crossclamp time of 61 minutes.  The heart begins to beat spontaneously  without need for cardioversion.  All proximal and distal coronary  anastomoses are inspected for hemostasis and appropriate graft orientation.  Epicardial pacing wires are fixed to the right ventricular outflow tract  into the right atrial appendage.  The patient is rewarmed to 37 degrees  centigrade temperature.  The patient is weaned from cardiopulmonary bypass  without difficulty.  The patient's rhythm at separation from bypass is  normal sinus rhythm.  No inotropic support is required.  Total  cardiopulmonary bypass time for the operation is 78 minutes.  Follow-up  transesophageal echocardiogram performed by Dr. Krista Blue after separation from  bypass demonstrates preserved left ventricular function.   The venous and arterial cannulae are removed uneventfully.  Protamine is  administered to  reverse the anticoagulation.  The mediastinum and the left  chest are irrigated with saline solution containing vancomycin.  Meticulous  surgical hemostasis is ascertained.  The mediastinum and both pleural spaces  are drained using four chest tubes exited through separate stab incisions  inferiorly.  The soft tissues anterior to the aorta and the pericardium are  reapproximated loosely.  The On-Q continuous pain management system is  utilized to facilitate postoperative pain control.  Two 10-inch Silastic  catheters supplied with the On-Q kit are tunneled into the deep subcutaneous  tissues and positioned just lateral to the lateral border of the sternum on  either side.  Each catheter is flushed with 5 cc of 0.5% bupivacaine  solution, and ultimately they are connected to the continuous infusion pump.  The sternum is closed with double-strength sternal wire.  The soft tissues anterior to the sternum are closed in multiple layers, and the skin is  closed with a running subcuticular skin closure.   The patient tolerated the procedure well and is transported to the surgical  intensive care unit in stable condition.  There are no intraoperative  complications.  All sponge, instrument, and needle counts verified correct  at completion of the operation.  No blood products were administered.      Salvatore Decent. Cornelius Moras, M.D.  Electronically Signed     CHO/MEDQ  D:  06/11/2006  T:  06/13/2006  Job:  [id number]   cc:   Madolyn Frieze. [name], MD, [facility]  [facility]

## 2011-01-23 NOTE — H&P (Signed)
[name], [name]               ACCOUNT NO.:  [id number]   MEDICAL RECORD NO.:  [id number]          PATIENT TYPE:  INP   LOCATION:  2901                         FACILITY:  [facility]   PHYSICIAN:  [name]. Dale Busman, MD     DATE OF BIRTH:  [birth year]   DATE OF ADMISSION:  06/08/2006  DATE OF DISCHARGE:                                HISTORY & PHYSICAL   PRIMARY CARDIOLOGIST:  [name], M.D.   PRIMARY MEDICAL DOCTOR:  [name] [facility]   CHIEF COMPLAINT:  Chest pain.   HISTORY OF PRESENT ILLNESS:  The patient is a 60 year old man with known  coronary artery disease.  He underwent PCI stenting to the LAD back in 2002.  Then, he had PCI stenting in the setting of inferior myocardial infarction  in March of 2004 with multiple TAXUS stents in [place].  He then suffered  in-stent thrombosis March 17, 2003 of the RCA and had re-intervention at that  site.  A catheterization at that point in time showed an EF of 50%, as well  as a D2 lesion and left main 30%.   The patient has been employed for extended periods of time in the [place] and  [place] area.  He has been out there for the last 2 months.  On June 04, 2006, he had just finished working an 18 hour shift.  He laid down to  get some rest when he the had onset of chest pressure and burning.  It was  associated with some shortness of breath.  It was moderate in severity.  It  initially felt like GERD, but was higher up in his chest.  He said it felt  distinctly different than when he had had a myocardial infarction in the  past.  There was no radiation, nausea, vomiting or palpitations.  It lasted  all day, and ultimately he went to the emergency department in the [place],  [place].  They stabilized him on nitroglycerin.  His troponin at that point  in time was found to be positive, but he had no ST elevations.  Of note, the  patient had been noncompliant with all of his medications for last 2 months.  On the  following day, June 05, 2006, he was taken to the cardiac  catheterization lab.  There, he had a significant left main lesion of 50%.  The LAD had an extensive stented portion with 70% stenosis in the distal  stented region and diffuse in-stent stenosis.  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  There was a tiny D1  that was subtotally occluded.  The left circumflex had 80-90% ostial lesion.  A large OM had a 95% lesion.  The RCA had diffuse in-stent stenosis 70-80%  in severity.  There was also a distal posterior lateral branch off the RCA  with a 50% lesion.  Ejection fraction was 40% with inferior hypokinesis.  Coronary artery bypass grafting was recommended.  The patient was stable  without further chest pain.  Because he is away from  home, he requested to  come to [facility] for further care.  His doctors in [place] recommended  against transfer, given his extensive coronary disease, but the patient left  [facility].  He took a flight from [place] to here tonight and reported directly to  the emergency department.  He is currently pain free and stable.   PAST MEDICAL HISTORY:  1. Coronary artery disease as above with multiple PCIs to the LAD and RCA      regions, and now with three-vessel disease including left main disease.  2. Sinus bradycardia on beta blocker.  3. Hyperlipidemia.  4. Erectile dysfunction.  5. Medical noncompliance.  6. Possible claudication.  7. Ongoing tobacco abuse.  8. Tonsillectomy/adenoidectomy as a child.   ALLERGIES:  1. CODEINE causes nausea.  2. CHANTIX causes nausea.   CURRENT MEDICATIONS:  None.  The patient ran out of his medicines 2 months  ago and had not been taking anything since that time.  He was previously  prescribed:  1. Lipitor 80.  2. Toprol XL 25.  3. Aspirin 81  4. Plavix 75.  He did not receive Plavix at the outside hospital.   SOCIAL HISTORY:  The patient lives in [place] with his  wife.  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  He has a greater than  60 pack-year history of smoking.  He has tried to quit multiple times, all  of which have been unsuccessful.  He drinks several beers on occasion.  His  diet is extremely poor while traveling, and he frequently eats fast food.   FAMILY HISTORY:  The patient's father had an MI in his 44s and died in his  49s.   REVIEW OF SYSTEMS:  Positive for chest pain and shortness of breath.  Otherwise, a 14-system review is negative in detail.   ADVANCE DIRECTIVES:  Full code.   PHYSICAL EXAMINATION:  VITAL SIGNS:  Temperature 98, pulse 86, blood  pressure 90/50, saturation 99% on room air.  GENERAL:  The patient was in no distress.  He had no rash.  No petechiae.  No jaundice.  HEENT:  Oral mucosa was without thrush or lesions.  He has none of his own  teeth and has upper and lower dentures.  NECK:  JVP appeared flat.  No carotid bruits.  Thyroid without enlargement.  LUNGS:  Clear to auscultation bilaterally.  HEART:  Regular rate and rhythm with normal S1-S2.  No murmur, no gallop.  ABDOMEN:  Soft, obese, but otherwise nontender with normoactive bowel  sounds.  His right groin had a 2+ pulse with no bruit and minimal hematoma  from prior cath.  LOWER EXTREMITIES:  Trivial edema and were warm.  He had a left lower  extremity anterior lesion from trauma that was healing.  NEUROLOGIC:  He was neurologically intact.   CHEST X-RAY:  Ordered and pending.   ELECTROCARDIOGRAM:  The EKG showed a rate of 64 in sinus rhythm with normal  axis, QRS of 100, small inferior Q-waves, 0.5 mm ST elevations inferiorly,  repolarization anteriorly, T-wave inversions laterally.  Compared with March 19, 2003, minimal change.   LABORATORY DATA:  White blood cell count 9, hematocrit 44, platelets 191.  Sodium 138, potassium 3.8, bicarbonate 25, BUN 18, creatinine 1.2, glucose 184, MB 3.5, CK of 83, troponin 0.60, PTT  13.   IMPRESSION:  A 60 year old man with known coronary disease, ongoing tobacco  abuse, and medical noncompliance, who suffered a recent non-ST-elevation  myocardial infarction and was found to have extensive three-vessel disease,  as well as left main disease with in-stent restenosis, as well.  He is now  effectively transferred for bypass surgery.  1. Coronary disease status post non-STEMI.  Tomorrow, will need to get the      actual films from his catheterization in [place].  He will be on      aspirin, heparin and 2b3a awaiting surgery.  No Plavix.  Beta blocker      as his heart rate tolerates.  ACE inhibitor as blood pressure      tolerates.  Statin high-dose and check fasting lipids.  Nitroglycerin      only as needed for chest pain.  Cardiothoracic surgery consult in the      morning.  2. Tobacco abuse.  Ongoing.  The patient was counseled extensively about      the importance of smoking cessation.  He says that this is nearly      impossible.  Will have to continue counseling while he is here in      house.  3. Noncompliance with medications.  I emphasized the importance of the      patient's compliance with medications and emphasized that his survival      is likely much greater to be increased by medications that it is by      revascularization.  He seemed to understand this.  4. Possible peripheral vascular disease.  His pulses seem okay and he has      good wound healing.  Consider ABIs if he has significant claudication      symptoms, but this can be done as an outpatient.  5. Prophylaxis.  He is on heparin.  Heart healthy diet.  Consider proton      pump inhibitor, as the patient is n.p.o. or postop.   DISPOSITION:  Likely CT surgery.           ______________________________  [name] Dale Busman, MD     LAA/MEDQ  D:  06/08/2006  T:  06/09/2006  Job:  [id number]

## 2011-01-23 NOTE — Assessment & Plan Note (Signed)
[facility]                              CARDIOLOGY OFFICE NOTE   NAME:Owusu, ANUP BRIGHAM                      MRN:          [id number]  DATE:07/02/2006                            DOB:          [birth year]    Mr. Vallance returns for a followup today. He was recently in [place] and  suffered a myocardial infarction and underwent cardiac catheterization. He  was found to have severe coronary disease with an ejection fraction of 40%.  He left that hospital against medical advice and came to [facility] for his  coronary artery bypass graft. The patient was placed on medications here  including Integrilin. He did drop his platelet count with that. However it  recovered and a heparin antibody was negative. He subsequently underwent  coronary artery bypass graft on October 5. He had a LIMA to the LAD,  sequential saphenous vein graft to the intermediate and obtuse marginal and  a saphenous vein graft to the PDA. Since then he has had done well. He did  have some chest congestion last week but this has improved. There is no  significant dyspnea on exertion, orthopnea, PND, pedal edema, palpitations,  presyncope, syncope or exertional chest pain.   MEDICATIONS:  1. Aspirin 81 mg p.o. daily.  2. Lopressor 25 mg p.o. b.i.d.  3. Altace 2.5 mg p.o. daily.  4. Lipitor 80 mg p.o. daily.   PHYSICAL EXAMINATION:  VITAL SIGNS:  Blood pressure of 128/62 and his pulse  is 74.  CHEST:  Clear.  CARDIOVASCULAR:  Reveals a regular rate and rhythm. His sternotomy is  without evidence of infection.  EXTREMITIES:  Show no edema.   His electrocardiogram today shows a sinus rhythm at a rate of 74. There is a  prior inferior infarct and there are nonspecific ST changes.   DIAGNOSES:  1. Coronary artery disease status post coronary artery bypass graft.  2. Hypertension.  3. Hyperlipidemia.  4. Thrombocytopenia with Integrilin.  5. Tobacco abuse now resolved.   PLAN:  Mr.  Mayorquin is doing well from a symptomatic standpoint. We will  continue with his present medications but I will discontinue his Altace and  begin lisinopril 10 mg p.o. daily for financial reasons. We will check a  BMET in 1 week to follow his potassium and renal function. He will needs  lipids and liver checked in 6 weeks given the recent reinitiation of his  Lipitor. We  discussed the importance of diet and exercise as well as continuing off  tobacco. I will see him back in 4 weeks.    ______________________________  Madolyn Frieze [name], MD, [facility]    BSC/MedQ  DD: 07/02/2006  DT: 07/03/2006  Job #: [id number]

## 2011-01-23 NOTE — Assessment & Plan Note (Signed)
[facility]                           PRIMARY CARE OFFICE NOTE   NAME:[name], [name]                      MRN:          [id number]  DATE:10/14/2006                            DOB:          [birth year]    CHIEF COMPLAINT:  New patient to practice.   HISTORY OF PRESENT ILLNESS:  The patient is a 60 year old white male  here to establish primary care. He is followed by Dr. [name] or  [facility] secondary to coronary artery disease. He is noted to  have suffered a myocardial infarction and underwent cardiac  catheterization in [place]. He was found to have severe coronary artery  disease with EF of 40%. He ultimately underwent CABG in [place] x4.  He has done fairly well after his coronary revascularization.   He has hyperlipidemia and was started on an ACE inhibitor, first  lisinopril which caused dizziness and then subsequently Altace. He could  not tolerate Altace secondary to headache.   REVIEW OF HIS CHART NOTES:  Some issues with medical noncompliance;  however, he has been able to discontinue smoking. He had previously a 45  pack year history.   His main complaint today has been chronic insomnia. It has been ongoing  for the last 2 years. 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 He does have some poor sleep hygiene with  eating late at night, large amounts of caffeine and working on his  computer right up until bedtime.   While he was hospitalized during his cardiac revascularization, he was  given Ambien. He had good response and requests a prescription.   PAST MEDICAL HISTORY:  1. Coronary artery disease status post bypass October 2007.  2. Hypertension.  3. Hyperlipidemia.  4. History of tobacco abuse.  5. Chronic insomnia.  6. Erectile dysfunction.   CURRENT MEDICATIONS:  1. Lopressor 25 mg p.o. b.i.d.  2. Lipitor 80 mg once at bedtime.  3. Multiple vitamin pack.  4. Aspirin 81 mg  once a day.   MEDICATION ALLERGIES:  CODEINE and CHANTIX which both cause nausea.   REVIEW OF SYSTEMS:  As noted above. Currently denies any chest pain or  shortness of breath. He does have a heartburn history. He saw a  gastroenterologist in [facility]. He states he was treated for a  bacterial infection presumed H. pylori. He has not had a followup test  since that time. He does still get occasional heartburn. He also has one  episode of nocturia per night. Usually he wakes up at 3-4 in the  morning. He denies any cramps at night. All other systems negative.   PHYSICAL EXAMINATION:  VITAL SIGNS:  Height is 5 foot 10, weight is 230  pounds, temperature is 97.3, pulse is 77. Blood pressure is 139/85 in  the left arm in seated position.  GENERAL:  The patient is a pleasant, 60 year old, white male who appears  slightly older than stated age.  HEENT:  Normocephalic, atraumatic. Pupils are equal and reactive to  light bilaterally. Extraocular muscles intact. The patient was  anicteric. Conjunctiva is within  normal limits. External auditory canals  and tympanic membranes are clear bilaterally. The patient's complexion  was somewhat ruddy. Oropharyngeal exam was unremarkable.  NECK:  Supple, no adenopathy, carotid bruit or thyromegaly.  CHEST:  Normal inspiratory effort. Clear to auscultation bilaterally. No  rhonchi, rales or wheezing.  CARDIOVASCULAR:  Regular rate and rhythm, no significant murmurs, rubs  or gallops appreciated.  ABDOMEN:  Soft, nontender, positive bowel sounds, no organomegaly, some  obesity.  MUSCULOSKELETAL:  No clubbing, cyanosis or edema.  SKIN:  Warm and dry.  NEUROLOGIC:  Cranial nerves II-XII was grossly intact. Nonfocal.   IMPRESSION/RECOMMENDATIONS:  1. Chronic insomnia.  2. History of gastroesophageal reflux disease, positive Helicobacter      pylori.  3. Coronary artery disease status post coronary artery bypass graft      October 2007.  4.  Hyperlipidemia.  5. Hypertension suboptimally controlled.  6. History of tobacco abuse.  7. Health maintenance.   RECOMMENDATIONS:  We discussed dependency potential with Ambien. We will  try improving sleep hygiene including decreasing caffeine and avoiding  stimulating activities before bedtime. We also will start Omeprazole 20  mg p.o. b.i.d. The patient may have heartburn that may be disrupting his  sleep.   If the patient has poor response, Lunesta may be a consideration.   He is to continue his current cardiovascular medication regimen. We will  try to add low dose Benazepril at 10 mg once a day. We will schedule a  BMET on his followup visit in approximately 1 month.   We will also discuss the need for a screening colonoscopy and prostate  screening on followup exam.     Barbette Hair. Artist Pais, DO  Electronically Signed    RDY/MedQ  DD: 10/14/2006  DT: 10/15/2006  Job #: [id number]

## 2011-01-23 NOTE — H&P (Signed)
NAME:  DRACO, MALCZEWSKI                         ACCOUNT NO.:  [id number]   MEDICAL RECORD NO.:  [id number]                   PATIENT TYPE:  EMS   LOCATION:  MAJO                                 FACILITY:  [facility]   PHYSICIAN:  Salvadore Farber, M.D.             DATE OF BIRTH:  1950-11-01   DATE OF ADMISSION:  03/17/2003  DATE OF DISCHARGE:                                HISTORY & PHYSICAL   CHIEF COMPLAINT:  Chest pain.   HISTORY OF PRESENT ILLNESS:  Mr. [name] is a 60 year old gentleman status  post angioplasty of the second diagonal branch in 2002 and inferior  myocardial infarction in March 2004 treated with stent x2 to the RCA at a  hospital in [place]. He had done well post MI. There was a stress test  approximately one month ago, the results of which are not available to me at  present. He ran out of all of his medications one week ago and thus has been  without aspirin and Plavix.   At an hour and 15 minutes prior to presentation, he developed 10/10 chest  pain associated with weakness, diaphoresis, and shortness of breath. There  was no syncope. He received two sublingual nitroglycerins in the emergency  room with decrease in his pain from 10/10 to 1/10 and resolution of the  shortness of breath. Electrocardiogram demonstrates inferior ST elevations  of approximately 2 mm with T wave inversion in aVL.   PAST MEDICAL HISTORY:  Coronary disease status post angioplasty of D2 with  stenting of RCA with prior IMI as detailed above, dyslipidemia.   ALLERGIES:  NKDA.   MEDICATIONS:  Taking none.   SOCIAL HISTORY:  Lives in [place] with his wife. He works as a Ecologist. Quit smoking May 1 after greater than 60 pack years. Denies alcohol  use.   FAMILY HISTORY:  Father died in his 44s of coronary disease with first MI in  his 6s.   REVIEW OF SYSTEMS:  Negative in detail except as above, with the exception  of some arthralgias in his right arm.   PHYSICAL  EXAMINATION:  GENERAL:  Mildly ill appearing man in no distress  with heart rate 60, blood pressure 102/85, temperature 97.2, oxygen  saturation of 98% on 2 liters nasal cannula. He has no jugular venous  distention.  LUNGS:  Clear to auscultation.  HEART:  He has a regular rate and rhythm with a normal S1 and S2. There is a  S4 but normal S3. No murmur.  ABDOMEN:  Soft, nondistended, nontender. There is hepatosplenomegaly. Bowel  sounds are normal.  EXTREMITIES:  Warm without clubbing, cyanosis, edema, or ulceration. Femoral  pulses are 2+ bilaterally without bruits. Dorsalis pedis pulses are 2+  bilaterally.   Electrocardiogram demonstrates sinus bradycardia at 53 beats per minute with  inferior ST elevations in T wave inversion in aVL.   LABORATORY STUDIES:  Remarkable for hematocrit  44, creatinine 1.1, potassium  3.4, glucose 113. Platelets are currently pending.   IMPRESSION/RECOMMENDATIONS:  Acute inferior myocardial infarction at less  than two hours. In the emergency room, he has received aspirin, heparin, and  nitroglycerin with improvement but not complete relief of his pain and no  improvement in his electrocardiogram. Risks and potential benefits of  primary angioplasty discussed with patient and his wife. Will proceed to  urgent catheterization with an eye to percutaneous revascularization.  Further plans will be after that study.                                               Salvadore Farber, M.D.    WED/MEDQ  D:  03/17/2003  T:  03/18/2003  Job:  [id number]   cc:   [facility]   Olga Millers, M.D.    cc:   [facility]   Olga Millers, [place].D.

## 2011-01-23 NOTE — Assessment & Plan Note (Signed)
[facility]                            CARDIOLOGY OFFICE NOTE   NAME:MARUTJewelz, Kobus                      MRN:          [id number]  DATE:10/08/2006                            DOB:          01-14-1951    Mr. Vasques returns for followup today. Since I last saw him, there is no  dyspnea, chest pain, palpitations or syncope and there is no pedal  edema. He did have some dizziness with beginning lisinopril 10 mg p.o.  daily and he discontinued this. He has discontinued his tobacco use.   CURRENT MEDICATIONS:  1. Aspirin 81 mg p.o. daily.  2. Lopressor 25 mg p.o. b.i.d.  3. Lipitor 80 mg p.o. daily.   PHYSICAL EXAMINATION:  VITAL SIGNS:  Blood pressure 158/84 and his pulse  is 78. He weighs 227 pounds.  NECK:  Supple.  CHEST:  Clear.  CARDIOVASCULAR:  Reveals a regular rate and rhythm.  EXTREMITIES:  Show no edema.   His electrocardiogram shows a sinus rhythm at a rate of 75. The axis is  normal. There are small insignificant inferolateral Q waves.   DIAGNOSES:  1. Coronary artery disease status post coronary artery bypass graft.  2. Ischemic cardiomyopathy.  3. Hypertension.  4. Hyperlipidemia.  5. History of thrombocytopenia with Integralin.   PLAN:  Mr. Christiansen is doing well from a symptomatic standpoint. We will  continue with his present medications. His ejection fraction was 40% by  previous catheterization. We will resume lisinopril at a lower dose to  see if he will tolerate (2.5 mg p.o. daily). I will repeat his  echocardiogram to reassess his LV function now that he has been  revascularized. He will continue with risk factor modification including  diet and exercise. Note he has discontinued his tobacco use. He will see  [place] back in 6 months.     Madolyn Frieze [name], MD, [facility]  Electronically Signed    BSC/MedQ  DD: 10/08/2006  DT: 10/08/2006  Job #: [id number]

## 2011-01-23 NOTE — Consult Note (Signed)
NAMECHURCHILL, GRIMSLEY               ACCOUNT NO.:  [id number]   MEDICAL RECORD NO.:  [id number]          PATIENT TYPE:  INP   LOCATION:  3714                         FACILITY:  [facility]   PHYSICIAN:  Salvatore Decent. Cornelius Moras, M.D. DATE OF BIRTH:  Dec 21, 1950   DATE OF CONSULTATION:  06/10/2006  DATE OF DISCHARGE:                                   CONSULTATION   REQUESTING PHYSICIAN:  Dr. Olga Millers.   REASON FOR CONSULTATION:  Severe 3-vessel coronary artery disease, status  post acute non-Q wave myocardial infarction.   HISTORY OF PRESENT ILLNESS:  Patient is a 60 year old white male with a  known history of coronary artery disease, hyperlipidemia, longstanding  tobacco abuse and a strong family history of coronary artery disease.  The  patient's history apparently dates back to 2002, at which time he suffered  an acute anterior wall myocardial infarction that was treated with  percutaneous coronary intervention and stenting of the left anterior  descending coronary artery.  Two years later, he apparently suffered an  inferior wall myocardial infarction that was treated with percutaneous  coronary intervention and stenting of the right coronary artery, somewhere  in [place].  Within a couple of months, he suffered another inferior wall  myocardial infarction and underwent repeat percutaneous coronary  intervention and stenting of the right coronary artery at that time.  Since  then, he has done well until recently.  However, he has not seen his  cardiologist for severe years, and he quit taking all of his medications  more than 2 months ago.  While traveling in [place], he developed sudden  onset, persisting, burning substernal chest pain occurring at rest at the  end of a long day.  His symptoms progressed for more than 8 hours,  ultimately prompting him to present to the emergency room there.  He was  diagnosed with an acute non-Q wave myocardial infarction.  He underwent  cardiac  catheterization demonstrating severe 3-vessel coronary artery  disease with mild to moderate left ventricular dysfunction.  He was advised  to undergo coronary artery bypass grafting.  The patient did not want to  stay in [place] for treatment and subsequently left the hospital against  medical advise and flew home to [place].  After arriving here in  [place], he promptly presented here to [facility] for hospital admission.  He has not had any chest pain since his hospital admission in [place].  Cath films from the catheterization performed in [facility] were procured  earlier today and now cardiac surgery consultation has been requested.   REVIEW OF SYSTEMS:  GENERAL:  The patient reports normal appetite.  He has  not been gaining or loosing weight recently.  He reports normal exercise and  energy level.  RESPIRATORY:  Negative.  The patient denies productive cough,  hemoptysis or wheezing.  CARDIAC:  Notable for a sudden onset of chest pain  in the setting of acute myocardial infarction last week.  The patient has  not had any symptoms of chest pain since then and he denies any preceding  symptoms of exertional  chest pain.  He has stable exertional shortness of  breath.  He denies resting shortness of breath, PND, orthopnea, or lower  extremity edema.  He has not had palpitations or syncope.  GASTROINTESTINAL:  Negative.  The patient has no difficulties swallowing.  He denies  hematochezia, hematemesis or melena.  He typically will go 2 or 3 days  between having bowel movements, although he states that constipation is not  typically a problem.  MUSCULOSKELETAL:  Notable for mild arthritis or  arthralgias inflicting both hands and both knees.  NEUROLOGIC:  Negative.  The patient denies symptoms suggestive of previous TIA or stroke.  GENITOURINARY:  Negative.  INFECTIONS:  Negative.  HEMATOLOGIC:  Negative.  HEENT:  Negative.  The patient is edentulous with a full set of  upper and  lower dentures.   PAST MEDICAL HISTORY:  1. Coronary artery disease, status post acute myocardial infarction 2002,      2004 x2 and September 2007.  2. Hyperlipidemia.  3. Longstanding tobacco abuse.   PAST SURGICAL HISTORY:  Right knee arthroscopy several years ago and  previous tonsillectomy.   FAMILY HISTORY:  Is notable in that the patient's father had his first heart  attack during his 30s and ultimately died from coronary disease.  Numerous  other family members have history of coronary artery disease as well.   SOCIAL HISTORY:  The patient is married and lives with his wife here in  [place].  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  He has a longstanding history of  tobacco abuse, smoking 1 pack of cigarettes per day at present.  Although,  in the past, he has smoked as much as 3 packs of cigarettes per day and he  has been a smoker for more than 40 years.   MEDICATIONS PRIOR TO ADMISSION:  None.  The patient had previously been  taking Lipitor, Toprol-XL, aspirin and Plavix.  However, the patient stopped  all medications more than 2 months ago.   DRUG ALLERGIES:  1. CODEINE CAUSES NAUSEA.  2. CHANTIX CAUSES NAUSEA.   PHYSICAL EXAMINATION:  GENERAL:  The patient is a well-appearing, moderately  obese white male who appears his stated age in no acute distress.  He is in  sinus rhythm.  VITAL SIGNS:  Blood pressure 132/70.  He is afebrile.  HEENT EXAM:  Grossly unremarkable.  NECK:  Unremarkable.  There is no cervical or supraclavicular  lymphadenopathy.  There is no jugular venous distention.  No carotid bruits  are noted.  AUSCULTATION ON THE CHEST:  Demonstrates clear breath sounds anteriorly.  No  wheezes or rhonchi are demonstrated.  CARDIOVASCULAR EXAM:  Notable for a regular rate and rhythm.  No murmurs,  rubs or gallops are appreciated. ABDOMEN:  The abdomen is soft and nontender.   There are no palpable masses.  Bowel sounds are present.  EXTREMITIES:  The extremities are warm and well perfused.  There is no lower  extremity edema.  Distal pulses are palpable in the posterior tibial  position.  There is some scrapes and excoriations on the left anterior shin  that the patient states from recent trauma at work.  RECTAL AND GU EXAMS:  Both deferred.  NEUROLOGIC EXAMINATION:  Grossly nonfocal.   DIAGNOSTIC TESTS:  Cardiac catheterization performed June 05, 2006 by  Dr. Zachery Conch in [place], [place] is reviewed.  This demonstrates severe  3-vessel coronary artery disease with mild left ventricular  dysfunction.  Specifically, there is long segment 70% proximal stenosis of the left  anterior descending coronary artery within previous stent.  There is 80% to  90% ostial stenosis of the left circumflex coronary artery with 99% proximal  stenosis of a small to medium size circumflex marginal branch.  There is 90%  ostial stenosis of the large ramus intermediate branch.  There is 100%  chronic occlusion of a small to medium size and diffusely diseased diagonal  branch of the left anterior descending coronary artery.  This chronically  occluded diagonal branch in the high-grade ostial stenosis of the ramus  intermediate branch were both present on previous catheterization in July of  2004.  There is right dominant coronary circulation.  There is 90% proximal  in-stent restenosis in the right coronary artery with 70% stenosis of the  mid right coronary artery and a large posterior descending coronary artery.  There is 70% stenosis of the distal right coronary artery beyond the  bifurcation.  Although the posterolateral branches at the distal right  coronary artery are relatively small and probably not suitable for grafting.   IMPRESSION:  Severe 3-vessel coronary artery disease, status post acute non-  Q wave myocardial infarction.  I believe that Mr. Winokur would best  be  treated by coronary artery bypass grafting.   PLAN:  I have discussed the options at length with Mr. Veney this afternoon.  Alternative treatments have been discussed.  He understands and accepts all  associated risks of surgery, including but not limited to, stroke, death,  myocardial infarction, congestive heart failure, respiratory failure,  pneumonia, bleeding requiring blood transfusion, arrhythmia, infection, and  recurrent coronary artery disease.  We have also discussed how important it  will remain for him to find a way to quit smoking and for him to also  continue to followup regularly with Dr. [name] and his medical physicians  for longterm attention to his medical needs.  All of his questions have been  addressed.  We tentatively plan to proceed with surgery tomorrow.      Salvatore Decent. Cornelius Moras, M.D.  Electronically Signed     CHO/MEDQ  D:  06/10/2006  T:  06/10/2006  Job:  [id number]   cc:   Madolyn Frieze. [name], MD, [facility]

## 2011-01-23 NOTE — Cardiovascular Report (Signed)
NAME:  [name], [name]                         ACCOUNT NO.:  [id number]   MEDICAL RECORD NO.:  [id number]                   PATIENT TYPE:  INP   LOCATION:  2928                                 FACILITY:  [facility]   PHYSICIAN:  Veneda Melter, M.D.                   DATE OF BIRTH:  July 03, 1951   DATE OF PROCEDURE:  03/19/2003  DATE OF DISCHARGE:                              CARDIAC CATHETERIZATION   PROCEDURES PERFORMED:  Selective coronary angiography.   DIAGNOSES:  1. Coronary atherosclerotic disease.  2. Patent stents, right coronary artery.  3. Status post inferior wall myocardial infarction.   HISTORY:  [name] is a 60 year old gentleman with a history of coronary  disease who previously had percutaneous intervention of the LAD as well as  the right coronary artery.  He underwent emergent intervention on March 17, 2003, when he presented with acute inferior wall myocardial infarction.  He  had stent placement to the mid right coronary artery.  Unfortunately, he had  distal thromboembolic phenomena and diminished flow.  He also had disease in  the LAD and diagonal branch of unclear significance.  Due the urgent nature  of his intervention as well as technical difficulties with the  catheterization lab, he was referred for relook angiography.   TECHNIQUE:  Informed consent was obtained. The patient brought to the  catheterization lab. A 6 French sheath was placed in the right femoral  artery using the modified Seldinger technique. A 6 [place] JL-4 and JR-4  catheters were then used to engage the left and right coronary arteries and  selective angiography performed in various projections using manual  injection contrast. After __________ catheter and sheath were removed and  manual pressure applied until adequate hemostasis was achieved.  The patient  tolerated the procedure well and was transferred to the floor in stable  condition.   FINDINGS:   LEFT HEART CATHETERIZATION:  1.  Left main trunk: Medium caliber vessel.  Proximal distal tapers of 30%.  2. LAD: This is a medium-caliber vessel that provides three diagonal     branches. The LAD has a previously placed stent in the proximal mid     section extending from just prior to the first diagonal branch to beyond     a small second diagonal branch.  There is moderate tubular instant     restenosis of 40-50%.  The distal LAD has mild disease.  The first     diagonal branch is a large caliber vessel.  It arises from the proximal     segment of the LAD stent.  There appears to be a high grade ostium     narrowing of at least 80%.  The small bifurcating second diagonal branch     was occluded proximally.  It also arises from within the stented region     of the mid LAD.  Distal flow is TIMI  grade 1.  The third diagonal branch     has mild disease and is a medium caliber vessel.  3. Left circumflex artery: This is a small caliber vessel that provides a     bifurcating marginal branch and a small AV continuation.  The marginal     branch has a moderate narrowing of 60% in the proximal segment prior to     its bifurcation.  4. Right coronary artery is dominant. It is a large caliber vessel that     provides the posterior descending artery and three posterior ventricular     branches in its terminal segment. The right coronary artery has a     previously placed stent starting from the proximal segment extending into     the mid vessel.  The proximal RCA has moderate disease of 20-30%     extending into the proximal segment of the stent.  The mid RCA has mild     irregularities.  The distal vessel has mild disease of 30%.  The     posterior descending artery as well as posterior ventricular branches     have mild diffuse disease of 30%.  There is TIMI-3 flow through the RCA.   ASSESSMENT/PLAN:  [name] is a 60 year old gentleman status post acute  intervention of the right coronary artery for an inferior wall  myocardial  infarction.  He has grade 3 flow to the distal RCA and no significant  residual disease.  He does have high grade narrowing of medium caliber first  diagonal branch from the LAD.  This vessel has significant distribution to  the anterior lateral wall.  Further clinical assessment will be made and  consideration given to percutaneous intervention in four to six weeks' time  when the patient has recovered from his acute event.                                               Veneda Melter, M.D.    NG/MEDQ  D:  03/19/2003  T:  03/20/2003  Job:  [id number]  Olga Millers, M.D.   Saul Fordyce, M.D.   cc:   Olga Millers, M.D.   Saul Fordyce, M.D.

## 2011-01-23 NOTE — Op Note (Signed)
[name], [name]               ACCOUNT NO.:  [id number]   MEDICAL RECORD NO.:  [id number]          PATIENT TYPE:  INP   LOCATION:  2306                         FACILITY:  [facility]   PHYSICIAN:  Quita Skye. Krista Blue, M.D.  DATE OF BIRTH:  [birth year]   DATE OF PROCEDURE:  06/11/2006  DATE OF DISCHARGE:                                 OPERATIVE REPORT   PROCEDURE:  Transesophageal echocardiogram.   ECHOCARDIOGRAPHER:  Quita Skye. Krista Blue, M.D.   DIAGNOSIS:  Coronary artery disease.   HISTORY:  Mr. [name] is a 60 year old white male, who presents to the  operating room for coronary artery bypass surgery.  Dr. Cornelius Moras requested  transesophageal echocardiogram for the intraoperative management of the  patient.   DESCRIPTION OF PROCEDURE:  Following a routine cardiac induction, a  transesophageal probe was covered with a Latex-free sheath.  The probe was  then lubricated and inserted carefully into the patient's esophagus.  Overall images of the heart were obtained, which did not show pericardial  effusion.  The right atrium was evaluated, which had an intact interatrial  septum.  There were no thrombus or masses in the atrium.  The tricuspid  valve had a normal structure with trace regurgitation.  There was no  evidence of masses or thrombus in the right ventricle, and there was good  contractility without evidence of segmental wall motion abnormality.  No  interventricular septal defects were seen.  The left atrium was evaluated,  which was normal in size.  No evidence of thrombus or masses in the atrium  or appendage.  The pulmonary vein flows were normal with no reversal of  flow.  The mitral valve had normal-appearing structure with trace  regurgitation.  The inflow pattern was normal and the left ventricle was  then evaluated.  There was no evidence of segmental wall motion abnormality.  There was some mild left ventricular hypertrophy.  There were no masses or  thrombus in the left  ventricle.  The aortic valve had 3 leaflets.  There was  no evidence of regurgitation or stenosis when the aortic valve was  evaluated.  Peak flows across the valve were less than 1.2.  The ascending  thoracic aorta was evaluated, as well as the descending, and there was some  mild atherosclerotic disease in the descending thoracic aorta.  The patient  underwent surgery, and following separation from the heart-lung machine, the  patient was reevaluated.  There were no changes in the valvular structure or  valvular function.  The left ventricle appeared to be functioning well, with  good contractility, with no evidence of segmental wall motion abnormality.  Contractility remained in the 50+% range.  The patient was monitored in the  post-bypass time, and following surgery, the transesophageal probe was  removed and the patient was taken to the SICU in good condition.           ______________________________  Quita Skye Krista Blue, M.D.     JDS/MEDQ  D:  06/11/2006  T:  06/12/2006  Job:  [id number]

## 2011-01-23 NOTE — Discharge Summary (Signed)
NAME:  [name], [name]                         ACCOUNT NO.:  [id number]   MEDICAL RECORD NO.:  [id number]                   PATIENT TYPE:  INP   LOCATION:  4711                                 FACILITY:  [facility]   PHYSICIAN:  Salvadore Farber, M.D.             DATE OF BIRTH:  [birth year]   DATE OF ADMISSION:  03/17/2003  DATE OF DISCHARGE:  03/20/2003                           DISCHARGE SUMMARY - REFERRING   PROCEDURE:  1. Cardiac catheterization.  2. Coronary arteriogram.  3. Left ventriculogram.  4. Taxus stent x1.   HOSPITAL COURSE:  [name] is a 60 year old male with known coronary artery  disease. His first intervention was a PTCA in 2002, but he had a MI in March  of this year where he got two stents to his RCA. He has been having recent  exertional symptoms and one episode of being lightheaded. On the day of  admission, he was painting a house outside in approximately 90-degree heat  with very high humidity. He had sudden onset of severe substernal chest pain  at a 10/10 that associated with weakness, shortness of breath, and  diaphoresis. He did not have any nitroglycerin with him. He came to the  emergency room where he received sublingual nitroglycerin, and his pain  decreased. By exam, he was not short of breath but was still complaining of  mild substernal chest pain and radiation to his left arm. His EKG had  inferior ST changes consistent with a MI, and he was taken urgently to the  catheterization lab.   The cardiac catheterization showed a thrombotic occlusion in the area of the  previous stent. His only other significant lesion was an 80% first diagonal.  He had successful PCI of the occluded RCA including increased dilatation of  previous stents to more closely fit the vessel and insertion of a Taxus  stent as well. He was to be on Plavix for a year and aspirin indefinitely.  He was on ReoPro for 12 hours. He was scheduled for a relook cardiac  catheterization  on 03/19/03.   The relook cardiac catheterization showed total occlusion of the small  second diagonal and an 80% stenosis in the first diagonal. He had TIMI-III  flow at his intervention site, and the stents were otherwise patent. It was  felt that the diagonal could be assessed clinically with possible PCI in  four to six weeks.   [name] was seen by Dr. [name] on 03/20/03. He was ambulating without  chest pain or shortness of breath, and his groin was stable. He had not been  taking his medication prior to admission for approximately a week secondary  to financial concerns. The importance of taking his medications was  reinforced, and the Accupril 5 mg was changed to enalapril 5 mg for better  insurance coverage. He was considered stable for discharge on 03/20/03.   LABORATORY DATA:  Hemoglobin 12.2, hematocrit 35.4,  WBCs 7.4, platelets 176.  Sodium 140, potassium 3.7, chloride 106, CO2 28, BUN 9, creatinine 0.9,  glucose 94. Postprocedure CK-MB 101/4.8.   Chest x-ray.   DISCHARGE CONDITION:  Improved.   DISCHARGE DIAGNOSES:  1. Acute inferior myocardial infarction with thrombosis, status post Taxus     stent this admission.  2. Status post myocardial infarction in [place] in March 2004 with stent x2     to the right coronary artery.  3. Status post stent to the left anterior descending artery.  4. Mild left ventricular dysfunction with an ejection fraction of 50%,     inferior hypokinesis by catheterization.  5. Sinus bradycardia on beta blockers.  6. Hyperlipidemia.  7. History of tobacco use (quit May 2004).  8. Family history of premature coronary artery disease.   DISCHARGE INSTRUCTIONS:  Activity level was to include no driving for a week  and no sexual or strenuous activity until cleared by M.D. He is to stick to  a low fat diet. He is to call the office with problems with the  catheterization site. He is to follow up with [facility]  as needed.  He has an appointment with Dr. [name] on July 23 at 9:15 a.m.   DISCHARGE MEDICATIONS:  1. Toprol-XL 25 mg q.d.  2. Plavix 75 mg q.d.  3. Aspirin 325 mg q.d.  4. Lipitor 40 mg q.d.  5. Enalapril 5 mg q.d.  6. Nitroglycerin p.r.n.     Lavella Hammock, P.A. [facility]                  Salvadore Farber, M.D.    RG/MEDQ  D:  03/20/2003  T:  03/20/2003  Job:  [id number]   cc:   [facility]   Olga Millers, M.D.    cc:   [facility]   Olga Millers, [place].D.

## 2011-03-08 ENCOUNTER — Inpatient Hospital Stay (INDEPENDENT_AMBULATORY_CARE_PROVIDER_SITE_OTHER)
Admission: RE | Admit: 2011-03-08 | Discharge: 2011-03-08 | Disposition: A | Discharge status: Home / Self Care | Payer: 59 | Source: Ambulatory Visit | Attending: Emergency Medicine | Admitting: Emergency Medicine

## 2011-03-08 DIAGNOSIS — M799 Soft tissue disorder, unspecified: Secondary | ICD-10-CM

## 2011-06-12 LAB — BASIC METABOLIC PANEL
CO2: 29 mEq/L (ref 19–32)
Chloride: 107 mEq/L (ref 96–112)
Creatinine, Ser: 1.12 mg/dL (ref 0.4–1.5)
GFR calc Af Amer: >60 mL/min (ref >60)
Potassium: 4.1 mEq/L (ref 3.5–5.1)

## 2011-06-12 LAB — CBC
Hemoglobin: 14.1 g/dL (ref 13.0–17.0)
MCV: 91.2 fL (ref 78.0–100.0)
Platelets: 256 10*3/uL (ref 150–400)
RBC: 4.6 MIL/uL (ref 4.22–5.81)
WBC: 11.3 10*3/uL — ABNORMAL HIGH (ref 4.0–10.5)

## 2011-06-12 LAB — POCT CARDIAC MARKERS
CKMB, poc: 1.1 ng/mL (ref 1.0–8.0)
Myoglobin, poc: 71.6 ng/mL (ref 12–200)

## 2011-11-25 ENCOUNTER — Other Ambulatory Visit (HOSPITAL_COMMUNITY): Payer: Self-pay | Admitting: Orthopaedic Surgery

## 2011-11-25 DIAGNOSIS — R52 Pain, unspecified: Secondary | ICD-10-CM

## 2011-11-27 ENCOUNTER — Ambulatory Visit (HOSPITAL_COMMUNITY)
Admission: RE | Admit: 2011-11-27 | Discharge: 2011-11-27 | Disposition: A | Discharge status: Home / Self Care | Payer: 59 | Source: Ambulatory Visit | Attending: Orthopaedic Surgery | Admitting: Orthopaedic Surgery

## 2011-11-27 DIAGNOSIS — M719 Bursopathy, unspecified: Secondary | ICD-10-CM | POA: Insufficient documentation

## 2011-11-27 DIAGNOSIS — M25519 Pain in unspecified shoulder: Secondary | ICD-10-CM | POA: Insufficient documentation

## 2011-11-27 DIAGNOSIS — M19019 Primary osteoarthritis, unspecified shoulder: Secondary | ICD-10-CM | POA: Insufficient documentation

## 2011-11-27 DIAGNOSIS — M67919 Unspecified disorder of synovium and tendon, unspecified shoulder: Secondary | ICD-10-CM | POA: Insufficient documentation

## 2011-11-27 DIAGNOSIS — R52 Pain, unspecified: Secondary | ICD-10-CM

## 2011-12-03 ENCOUNTER — Encounter (HOSPITAL_BASED_OUTPATIENT_CLINIC_OR_DEPARTMENT_OTHER): Payer: Self-pay | Admitting: *Deleted

## 2011-12-03 ENCOUNTER — Telehealth: Payer: Self-pay | Admitting: Cardiology

## 2011-12-03 NOTE — Progress Notes (Signed)
Discussed pt with anesthesia-has not see dr [name] since 11/11 according to [place] office. Called dr Ophelia Charter office-pt needs to get cardiac clearence prior to surg

## 2011-12-03 NOTE — Telephone Encounter (Signed)
Spoke with pt, appt made for pt to be seen next week

## 2011-12-03 NOTE — Telephone Encounter (Signed)
New problem  Per Patient wife calling, Dr. Ophelia Charter wants to perform shoulder surgery  on Monday. Dr. Ophelia Charter wanted to know can patient be seen today.

## 2011-12-03 NOTE — Progress Notes (Signed)
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 Denies any resp-hx bronchitis, does not use any inhalers regularly.  Needs ekg,istat

## 2011-12-07 ENCOUNTER — Encounter: Payer: Self-pay | Admitting: *Deleted

## 2011-12-07 ENCOUNTER — Ambulatory Visit (HOSPITAL_BASED_OUTPATIENT_CLINIC_OR_DEPARTMENT_OTHER): Admission: RE | Admit: 2011-12-07 | Payer: 59 | Source: Ambulatory Visit | Admitting: Orthopaedic Surgery

## 2011-12-07 ENCOUNTER — Encounter (HOSPITAL_BASED_OUTPATIENT_CLINIC_OR_DEPARTMENT_OTHER): Admission: RE | Payer: Self-pay | Source: Ambulatory Visit

## 2011-12-07 HISTORY — DX: Atherosclerotic heart disease of native coronary artery without angina pectoris: I25.10

## 2011-12-07 HISTORY — DX: Gastro-esophageal reflux disease without esophagitis: K21.9

## 2011-12-07 HISTORY — DX: Acute myocardial infarction, unspecified: I21.9

## 2011-12-07 HISTORY — DX: Unspecified osteoarthritis, unspecified site: M19.90

## 2011-12-07 HISTORY — DX: Chronic obstructive pulmonary disease, unspecified: J44.9

## 2011-12-07 HISTORY — DX: Essential (primary) hypertension: I10

## 2011-12-07 SURGERY — SHOULDER ARTHROSCOPY WITH SUBACROMIAL DECOMPRESSION AND BICEP TENDON REPAIR
Anesthesia: General | Laterality: Right

## 2011-12-08 ENCOUNTER — Other Ambulatory Visit: Payer: Self-pay | Admitting: Cardiology

## 2011-12-08 ENCOUNTER — Ambulatory Visit (INDEPENDENT_AMBULATORY_CARE_PROVIDER_SITE_OTHER): Payer: 59 | Admitting: Physician Assistant

## 2011-12-08 ENCOUNTER — Encounter: Payer: Self-pay | Admitting: Physician Assistant

## 2011-12-08 VITALS — BP 148/80 | Ht 70.0 in | Wt 218.8 lb

## 2011-12-08 DIAGNOSIS — I2510 Atherosclerotic heart disease of native coronary artery without angina pectoris: Secondary | ICD-10-CM

## 2011-12-08 DIAGNOSIS — Z01810 Encounter for preprocedural cardiovascular examination: Secondary | ICD-10-CM

## 2011-12-08 MED ORDER — ATORVASTATIN CALCIUM 80 MG PO TABS: 80.0000 mg | ORAL_TABLET | Freq: Every day | ORAL | Status: DC

## 2011-12-08 NOTE — Progress Notes (Signed)
[address] [place], [place]  [address] Phone: [phone] Fax:  [phone]  Date:  12/08/2011   Name:  [name]       DOB:  Aug 16, 1951 MRN:  [id number]  PCP:  None  Primary Cardiologist:  Dr. Olga Millers  Primary Electrophysiologist:  None    History of Present Illness: [name] is a 61 y.o. male who presents for surgical clearance.  He has a history of coronary disease, s/p multiple prior PCIs, s/p subsequent CABG 06/2006 ( LIMA to the LAD, sequential saphenous vein graft to the intermediate and obtuse marginal, and a saphenous vein graft to the PDA).  His last echocardiogram in February 2008 showed preserved LV function. Carotid Dopplers in April of 2005 showed normal arteries.  ABIs for leg pain in 08/2010 were normal.  Last saw Dr. Olga Millers in 08/2010.  He needs right shoulder surgery for right AC DJD with Dr Ophelia Charter.  The patient denies chest pain, shortness of breath, syncope, orthopnea, PND or significant pedal edema.  He still smokes cigarettes.  He is compliant with medications.    Past Medical History  Diagnosis Date  . COPD (chronic obstructive pulmonary disease)   . Coronary artery disease   . Myocardial infarction   . Hypertension   . Asthma     occ bronchitis  . GERD (gastroesophageal reflux disease)   . Arthritis     Current Outpatient Prescriptions  Medication Sig Dispense Refill  . aspirin 81 MG tablet Take 81 mg by mouth daily.      Marland Kitchen atorvastatin (LIPITOR) 80 MG tablet Take 1 tablet (80 mg total) by mouth at bedtime.  30 tablet  12  . metoprolol tartrate (LOPRESSOR) 25 MG tablet Take 1 tablet (25 mg total) by mouth 2 (two) times daily.  180 tablet  2  . omeprazole (PRILOSEC) 20 MG capsule Take 20 mg by mouth daily.      . traMADol (ULTRAM) 50 MG tablet Take 50 mg by mouth every 6 (six) hours as needed.      Marland Kitchen DISCONTD: atorvastatin (LIPITOR) 80 MG tablet Take 1 tablet (80 mg total) by mouth daily.  90 tablet   2  . DISCONTD: atorvastatin (LIPITOR) 80 MG tablet TAKE ONE TABLET BY MOUTH EVERY DAY  30 tablet  12    Allergies: Allergies  Allergen Reactions  . Codeine     History  Substance Use Topics  . Smoking status: Current Everyday Smoker -- 1.5 packs/day  . Smokeless tobacco: Not on file  . Alcohol Use: Yes     ROS:  Please see the history of present illness.    All other systems reviewed and negative.   PHYSICAL EXAM: VS:  BP 148/80  Ht 5\' 10"  (1.778 m)  Wt 218 lb 12.8 oz (99.247 kg)  BMI 31.39 kg/m2 Well nourished, well developed, in no acute distress HEENT: normal Neck: no JVD Vasc: no carotid bruits Cardiac:  normal S1, S2; RRR; no murmur Lungs:  Decreased breath sounds bilaterally, no wheezing, rhonchi or rales Abd: soft, nontender, no hepatomegaly Ext: no edema Skin: warm and dry Neuro:  CNs 2-12 intact, no focal abnormalities noted  EKG:  NSR, HR 64, normal axis, NSSTTW changes  ASSESSMENT AND PLAN:  1.  CAD, s/p CABG in 2007 He can achieve 4 METs without anginal symptoms.  However, he is more than 5 years out from his CABG.  He continues to smoke.  I recommend ETT-myoview prior to  clearing him for surgery.  If low risk he can proceed.  He should remain on ASA (if ok with surgery).  He should remain on metoprolol to reduce CV risk in perioperative period.  Our service is available as necessary.  Follow up with Dr. Olga Millers in 4 mos.   2.  Right shoulder DJD Right shoulder surgery pending.  Proceed with ETT-myoview as noted.  3.  Hyperlipidemia Refill Lipitor.    Signed, Tereso Newcomer, PA-C  4:17 PM 12/08/2011

## 2011-12-08 NOTE — Patient Instructions (Signed)
Your physician recommends that you schedule a follow-up appointment in: 4 MONTHS WITH DR. CRENSHAW  Your physician has requested that you have en exercise stress myoview DX V72.81 THIS WILL BE ON 12/10/11 @ 8:45 AM, PLEASE ARRIVE 15 MINUTES EARLY TO BE REGISTERED. For further information please visit [email or web address] Please follow instruction sheet, as given.

## 2011-12-10 ENCOUNTER — Ambulatory Visit (HOSPITAL_COMMUNITY): Payer: 59 | Attending: Cardiovascular Disease | Admitting: Radiology

## 2011-12-10 VITALS — BP 188/86 | Ht 70.0 in | Wt 218.0 lb

## 2011-12-10 DIAGNOSIS — I739 Peripheral vascular disease, unspecified: Secondary | ICD-10-CM | POA: Insufficient documentation

## 2011-12-10 DIAGNOSIS — E785 Hyperlipidemia, unspecified: Secondary | ICD-10-CM | POA: Insufficient documentation

## 2011-12-10 DIAGNOSIS — Z01810 Encounter for preprocedural cardiovascular examination: Secondary | ICD-10-CM | POA: Insufficient documentation

## 2011-12-10 DIAGNOSIS — I219 Acute myocardial infarction, unspecified: Secondary | ICD-10-CM

## 2011-12-10 DIAGNOSIS — I10 Essential (primary) hypertension: Secondary | ICD-10-CM | POA: Insufficient documentation

## 2011-12-10 DIAGNOSIS — F17200 Nicotine dependence, unspecified, uncomplicated: Secondary | ICD-10-CM | POA: Insufficient documentation

## 2011-12-10 DIAGNOSIS — I252 Old myocardial infarction: Secondary | ICD-10-CM | POA: Insufficient documentation

## 2011-12-10 DIAGNOSIS — R55 Syncope and collapse: Secondary | ICD-10-CM | POA: Insufficient documentation

## 2011-12-10 DIAGNOSIS — J45909 Unspecified asthma, uncomplicated: Secondary | ICD-10-CM | POA: Insufficient documentation

## 2011-12-10 DIAGNOSIS — Z951 Presence of aortocoronary bypass graft: Secondary | ICD-10-CM | POA: Insufficient documentation

## 2011-12-10 DIAGNOSIS — Z8249 Family history of ischemic heart disease and other diseases of the circulatory system: Secondary | ICD-10-CM | POA: Insufficient documentation

## 2011-12-10 DIAGNOSIS — I2510 Atherosclerotic heart disease of native coronary artery without angina pectoris: Secondary | ICD-10-CM

## 2011-12-10 MED ORDER — TECHNETIUM TC 99M TETROFOSMIN IV KIT
11.0000 | PACK | Freq: Once | INTRAVENOUS | Status: AC | PRN
  Administered 2011-12-10: 11 via INTRAVENOUS

## 2011-12-10 MED ORDER — TECHNETIUM TC 99M TETROFOSMIN IV KIT
33.0000 | PACK | Freq: Once | INTRAVENOUS | Status: AC | PRN
  Administered 2011-12-10: 33 via INTRAVENOUS

## 2011-12-10 NOTE — Progress Notes (Signed)
[facility] SITE 3 NUCLEAR MED [address] [place] [address] [phone]  Cardiology Nuclear Med Study  [name] is a 61 y.o. male     MRN : [id number]     DOB: [birth year]  Procedure Date: 12/10/2011  Nuclear Med Background Indication for Stress Test:  Evaluation for Ischemia,  Pending Surgical Clearance: Right shoulder surgery, and Graft Patency History:  Asthma, '05 MI: IWMI, H/O AWMI, NoN Q wave MI 2007, '07 CABG x4 Cardiac Risk Factors: Claudication, Family History - CAD, Hypertension, Lipids and Smoker  Symptoms:  Syncope   Nuclear Pre-Procedure Caffeine/Decaff Intake:  None NPO After: 7:00pm   Lungs:  clear O2 Sat: 96% on room air. IV 0.9% NS with Angio Cath:  20g  IV Site: R Antecubital  IV Started by:  Stanton Kidney, EMT-P  Chest Size (in):  44 Cup Size: n/a  Height: 5\' 10"  (1.778 m)  Weight:  218 lb (98.884 kg)  BMI:  Body mass index is 31.28 kg/(m^2). Tech Comments:  Metoprolol held > 24 hours, per patient.    Nuclear Med Study 1 or 2 day study: 1 day  Stress Test Type:  Stress  Reading MD: Kristeen Miss, MD  Order Authorizing Provider:  Olga Millers MD/ Tereso Newcomer PA  Resting Radionuclide: Technetium 64m Tetrofosmin  Resting Radionuclide Dose: 11.0 mCi   Stress Radionuclide:  Technetium 36m Tetrofosmin  Stress Radionuclide Dose: 33.0 mCi           Stress Protocol Rest HR: 53 Stress HR: 137  Rest BP: 188/86 Stress BP: 170/78  Exercise Time (min): 5:30 METS: 7.0   Predicted Max HR: 161 bpm % Max HR: 85.09 bpm Rate Pressure Product: 42706   Dose of Adenosine (mg):  n/a Dose of Lexiscan: n/a mg  Dose of Atropine (mg): n/a Dose of Dobutamine: n/a mcg/kg/min (at max HR)  Stress Test Technologist: Milana Na, EMT-P  Nuclear Technologist:  Domenic Polite, CNMT     Rest Procedure:  Myocardial perfusion imaging was performed at rest 45 minutes following the intravenous administration of Technetium 35m Tetrofosmin. Rest ECG:  NSR - Normal EKG  Stress Procedure:  The patient performed treadmill exercise using a Bruce  Protocol for 5:30 minutes. The patient stopped due to sob,leg pain, and denied any chest pain.  There were non specific ST-T wave changes and a rare pvc.  Technetium 14m Tetrofosmin was injected at peak exercise and myocardial perfusion imaging was performed after a brief delay. Stress ECG: No significant ST segment change suggestive of ischemia.  QPS Raw Data Images:  Normal; no motion artifact; normal heart/lung ratio. Stress Images:  Normal homogeneous uptake in all areas of the myocardium. Rest Images:  Normal homogeneous uptake in all areas of the myocardium. Subtraction (SDS):  No evidence of ischemia. Transient Ischemic Dilatation (Normal <1.22):  0.84 Lung/Heart Ratio (Normal <0.45):  0.36  Quantitative Gated Spect Images QGS EDV:  123 ml QGS ESV:  58 ml  Impression Exercise Capacity:  Fair exercise capacity. BP Response:  Normal blood pressure response. Clinical Symptoms:  No significant symptoms noted. ECG Impression:  Insignificant upsloping ST segment depression.  There is TWI in the inferior leads.  These changes resolved in the recovery phase. Comparison with Prior Nuclear Study: No images to compare  Overall Impression:  Low risk stress nuclear study.  The ECG showed nonspecific ST / T wave abnormalities.  The scintifigraphic images were normal.  The LV function is normal.  LV Ejection Fraction: 53%.  LV Wall Motion:  NL LV Function; NL Wall Motion    [name], [name]., MD, [place] 12/10/2011, 4:26 PM

## 2011-12-11 ENCOUNTER — Encounter (HOSPITAL_COMMUNITY)
Admission: RE | Admit: 2011-12-11 | Discharge: 2011-12-11 | Disposition: A | Discharge status: Home / Self Care | Payer: 59 | Source: Ambulatory Visit | Attending: Orthopaedic Surgery | Admitting: Orthopaedic Surgery

## 2011-12-11 ENCOUNTER — Encounter (HOSPITAL_COMMUNITY): Payer: Self-pay

## 2011-12-11 ENCOUNTER — Other Ambulatory Visit (HOSPITAL_COMMUNITY): Payer: Self-pay | Admitting: Orthopaedic Surgery

## 2011-12-11 ENCOUNTER — Encounter (HOSPITAL_COMMUNITY): Payer: Self-pay | Admitting: Pharmacy Technician

## 2011-12-11 ENCOUNTER — Ambulatory Visit (HOSPITAL_COMMUNITY)
Admission: RE | Admit: 2011-12-11 | Discharge: 2011-12-11 | Disposition: A | Discharge status: Home / Self Care | Payer: 59 | Source: Ambulatory Visit | Attending: Orthopaedic Surgery | Admitting: Orthopaedic Surgery

## 2011-12-11 ENCOUNTER — Other Ambulatory Visit (HOSPITAL_COMMUNITY): Payer: Self-pay | Admitting: *Deleted

## 2011-12-11 DIAGNOSIS — Z01818 Encounter for other preprocedural examination: Secondary | ICD-10-CM | POA: Insufficient documentation

## 2011-12-11 DIAGNOSIS — Z01812 Encounter for preprocedural laboratory examination: Secondary | ICD-10-CM | POA: Insufficient documentation

## 2011-12-11 HISTORY — DX: Other seasonal allergic rhinitis: J30.2

## 2011-12-11 LAB — CBC
MCV: 87.9 fL (ref 78.0–100.0)
Platelets: 203 10*3/uL (ref 150–400)
RBC: 4.71 MIL/uL (ref 4.22–5.81)
WBC: 10.1 10*3/uL (ref 4.0–10.5)

## 2011-12-11 LAB — COMPREHENSIVE METABOLIC PANEL
ALT: 19 U/L (ref 0–53)
AST: 18 U/L (ref 0–37)
CO2: 27 mEq/L (ref 19–32)
Chloride: 101 mEq/L (ref 96–112)
Creatinine, Ser: 0.92 mg/dL (ref 0.50–1.35)
GFR calc non Af Amer: >90 mL/min (ref >90)
Total Bilirubin: 0.4 mg/dL (ref 0.3–1.2)

## 2011-12-11 LAB — SURGICAL PCR SCREEN: MRSA, PCR: NEGATIVE

## 2011-12-11 LAB — PROTIME-INR: INR: 0.93 (ref 0.00–1.49)

## 2011-12-11 NOTE — Pre-Procedure Instructions (Signed)
20 [name]  12/11/2011   Your procedure is scheduled on:  Monday, April 8th.  Report to [facility] at 10:45 AM.  Call this number if you have problems the morning of surgery: [phone]   Remember:   Do not eat food:After Midnight.    May have clear liquids: up to 4 Hours before arrival.  (6:45am)  Clear liquids include soda, tea, black coffee, apple or grape juice, broth.   Take these medicines the morning of surgery with A SIP OF WATER: Metoprolol ( Lopressor), Omeprazole (Priolosec), Tramadol (Ultram)  Do not wear jewelry, make-up or nail polish.  Do not wear lotions, powders, or perfumes. You may wear deodorant.  Do not shave 48 hours prior to surgery.  Do not bring valuables to the hospital.  Contacts, dentures or bridgework may not be worn into surgery.  Leave suitcase in the car. After surgery it may be brought to your room.  For patients admitted to the hospital, checkout time is 11:00 AM the day of discharge.   Patients discharged the day of surgery will not be allowed to drive home.  Name and phone number of your driver:--  Special Instructions: CHG Shower Use Special Wash: 1/2 bottle night before surgery and 1/2 bottle morning of surgery.   Please read over the following fact sheets that you were given: Pain Booklet, Coughing and Deep Breathing, MRSA Information and Surgical Site Infection Prevention

## 2011-12-13 MED ORDER — CEFAZOLIN SODIUM-DEXTROSE 2-3 GM-% IV SOLR
2.0000 g | INTRAVENOUS | Status: AC
  Administered 2011-12-14: 2 g via INTRAVENOUS
  Filled 2011-12-13: qty 50

## 2011-12-14 ENCOUNTER — Encounter (HOSPITAL_COMMUNITY)
Admission: RE | Disposition: A | Discharge status: Home / Self Care | Payer: Self-pay | Source: Ambulatory Visit | Attending: Orthopaedic Surgery

## 2011-12-14 ENCOUNTER — Ambulatory Visit (HOSPITAL_COMMUNITY): Payer: 59 | Admitting: Anesthesiology

## 2011-12-14 ENCOUNTER — Ambulatory Visit (HOSPITAL_COMMUNITY)
Admission: RE | Admit: 2011-12-14 | Discharge: 2011-12-14 | Disposition: A | Discharge status: Home / Self Care | Payer: 59 | Source: Ambulatory Visit | Attending: Orthopaedic Surgery | Admitting: Orthopaedic Surgery

## 2011-12-14 ENCOUNTER — Encounter (HOSPITAL_COMMUNITY): Payer: Self-pay | Admitting: Anesthesiology

## 2011-12-14 ENCOUNTER — Encounter (HOSPITAL_COMMUNITY): Payer: Self-pay | Admitting: Surgery

## 2011-12-14 DIAGNOSIS — I252 Old myocardial infarction: Secondary | ICD-10-CM | POA: Insufficient documentation

## 2011-12-14 DIAGNOSIS — J4489 Other specified chronic obstructive pulmonary disease: Secondary | ICD-10-CM | POA: Insufficient documentation

## 2011-12-14 DIAGNOSIS — M67919 Unspecified disorder of synovium and tendon, unspecified shoulder: Secondary | ICD-10-CM | POA: Insufficient documentation

## 2011-12-14 DIAGNOSIS — Z5333 Arthroscopic surgical procedure converted to open procedure: Secondary | ICD-10-CM | POA: Insufficient documentation

## 2011-12-14 DIAGNOSIS — K219 Gastro-esophageal reflux disease without esophagitis: Secondary | ICD-10-CM | POA: Insufficient documentation

## 2011-12-14 DIAGNOSIS — S43439A Superior glenoid labrum lesion of unspecified shoulder, initial encounter: Secondary | ICD-10-CM

## 2011-12-14 DIAGNOSIS — M25819 Other specified joint disorders, unspecified shoulder: Secondary | ICD-10-CM | POA: Insufficient documentation

## 2011-12-14 DIAGNOSIS — M1990 Unspecified osteoarthritis, unspecified site: Secondary | ICD-10-CM | POA: Insufficient documentation

## 2011-12-14 DIAGNOSIS — J449 Chronic obstructive pulmonary disease, unspecified: Secondary | ICD-10-CM | POA: Insufficient documentation

## 2011-12-14 DIAGNOSIS — I10 Essential (primary) hypertension: Secondary | ICD-10-CM | POA: Insufficient documentation

## 2011-12-14 DIAGNOSIS — I2510 Atherosclerotic heart disease of native coronary artery without angina pectoris: Secondary | ICD-10-CM | POA: Insufficient documentation

## 2011-12-14 DIAGNOSIS — M719 Bursopathy, unspecified: Secondary | ICD-10-CM | POA: Insufficient documentation

## 2011-12-14 HISTORY — PX: SHOULDER ARTHROSCOPY: SHX128

## 2011-12-14 SURGERY — ARTHROSCOPY, SHOULDER
Anesthesia: General | Site: Shoulder | Laterality: Right | Wound class: Clean

## 2011-12-14 MED ORDER — NEOSTIGMINE METHYLSULFATE 1 MG/ML IJ SOLN
INTRAMUSCULAR | Status: DC | PRN
  Administered 2011-12-14: 2.5 mg via INTRAVENOUS

## 2011-12-14 MED ORDER — LIDOCAINE HCL (CARDIAC) 20 MG/ML IV SOLN
INTRAVENOUS | Status: DC | PRN
  Administered 2011-12-14: 100 mg via INTRAVENOUS

## 2011-12-14 MED ORDER — BUPIVACAINE-EPINEPHRINE PF 0.5-1:200000 % IJ SOLN
INTRAMUSCULAR | Status: DC | PRN
  Administered 2011-12-14: 20 mL

## 2011-12-14 MED ORDER — MIDAZOLAM HCL 5 MG/5ML IJ SOLN
INTRAMUSCULAR | Status: DC | PRN
  Administered 2011-12-14: 1 mg via INTRAVENOUS

## 2011-12-14 MED ORDER — PROMETHAZINE HCL 25 MG/ML IJ SOLN: 6.2500 mg | INTRAMUSCULAR | Status: DC | PRN

## 2011-12-14 MED ORDER — VECURONIUM BROMIDE 10 MG IV SOLR
INTRAVENOUS | Status: DC | PRN
  Administered 2011-12-14: 1 mg via INTRAVENOUS
  Administered 2011-12-14: 5 mg via INTRAVENOUS

## 2011-12-14 MED ORDER — FENTANYL CITRATE 0.05 MG/ML IJ SOLN
INTRAMUSCULAR | Status: DC | PRN
  Administered 2011-12-14: 50 ug via INTRAVENOUS
  Administered 2011-12-14 (×2): 100 ug via INTRAVENOUS

## 2011-12-14 MED ORDER — METOPROLOL TARTRATE 12.5 MG HALF TABLET
ORAL_TABLET | ORAL | Status: AC
  Administered 2011-12-14: 25 mg via ORAL
  Filled 2011-12-14: qty 2

## 2011-12-14 MED ORDER — OXYCODONE-ACETAMINOPHEN 5-325 MG PO TABS: 2.0000 | ORAL_TABLET | ORAL | Status: AC | PRN

## 2011-12-14 MED ORDER — DEXAMETHASONE SODIUM PHOSPHATE 4 MG/ML IJ SOLN
INTRAMUSCULAR | Status: DC | PRN
  Administered 2011-12-14: 8 mg via INTRAVENOUS

## 2011-12-14 MED ORDER — EPHEDRINE SULFATE 50 MG/ML IJ SOLN
INTRAMUSCULAR | Status: DC | PRN
  Administered 2011-12-14: 5 mg via INTRAVENOUS
  Administered 2011-12-14: 15 mg via INTRAVENOUS
  Administered 2011-12-14 (×3): 10 mg via INTRAVENOUS

## 2011-12-14 MED ORDER — GLYCOPYRROLATE 0.2 MG/ML IJ SOLN
INTRAMUSCULAR | Status: DC | PRN
  Administered 2011-12-14: .5 mg via INTRAVENOUS

## 2011-12-14 MED ORDER — ONDANSETRON HCL 4 MG/2ML IJ SOLN
INTRAMUSCULAR | Status: DC | PRN
  Administered 2011-12-14 (×2): 4 mg via INTRAVENOUS

## 2011-12-14 MED ORDER — SODIUM CHLORIDE 0.9 % IR SOLN
Status: DC | PRN
  Administered 2011-12-14: 6000 mL

## 2011-12-14 MED ORDER — FENTANYL CITRATE 0.05 MG/ML IJ SOLN
25.0000 ug | INTRAMUSCULAR | Status: DC | PRN
  Administered 2011-12-14 (×2): 50 ug via INTRAVENOUS

## 2011-12-14 MED ORDER — LACTATED RINGERS IV SOLN
INTRAVENOUS | Status: DC
  Administered 2011-12-14 (×2): via INTRAVENOUS

## 2011-12-14 MED ORDER — PROPOFOL 10 MG/ML IV BOLUS
INTRAVENOUS | Status: DC | PRN
  Administered 2011-12-14: 200 mg via INTRAVENOUS

## 2011-12-14 MED ORDER — LIDOCAINE-EPINEPHRINE (PF) 1 %-1:200000 IJ SOLN
INTRAMUSCULAR | Status: DC | PRN
  Administered 2011-12-14: 10 mL

## 2011-12-14 MED ORDER — ALBUTEROL SULFATE HFA 108 (90 BASE) MCG/ACT IN AERS
INHALATION_SPRAY | RESPIRATORY_TRACT | Status: DC | PRN
  Administered 2011-12-14: 2 via RESPIRATORY_TRACT

## 2011-12-14 MED ORDER — KETOROLAC TROMETHAMINE 30 MG/ML IJ SOLN
15.0000 mg | Freq: Once | INTRAMUSCULAR | Status: AC | PRN
  Administered 2011-12-14: 30 mg via INTRAVENOUS

## 2011-12-14 MED ORDER — METOPROLOL TARTRATE 25 MG PO TABS
25.0000 mg | ORAL_TABLET | Freq: Once | ORAL | Status: AC
  Administered 2011-12-14: 25 mg via ORAL
  Filled 2011-12-14: qty 1

## 2011-12-14 SURGICAL SUPPLY — 61 items
BENZOIN TINCTURE PRP APPL 2/3 (GAUZE/BANDAGES/DRESSINGS) IMPLANT
BLADE CUDA 5.5 (BLADE) IMPLANT
BLADE CUTTER GATOR 3.5 (BLADE) IMPLANT
BLADE GREAT WHITE 4.2 (BLADE) ×2 IMPLANT
BLADE SURG 11 STRL SS (BLADE) ×2 IMPLANT
BUR OVAL 6.0 (BURR) ×2 IMPLANT
CANNULA ACUFLEX KIT 5X76 (CANNULA) ×4 IMPLANT
CLOTH BEACON ORANGE TIMEOUT ST (SAFETY) ×2 IMPLANT
COVER SURGICAL LIGHT HANDLE (MISCELLANEOUS) ×4 IMPLANT
DRAPE INCISE IOBAN 66X45 STRL (DRAPES) ×2 IMPLANT
DRAPE STERI 35X30 U-POUCH (DRAPES) ×2 IMPLANT
DRAPE U-SHAPE 47X51 STRL (DRAPES) ×2 IMPLANT
DRSG PAD ABDOMINAL 8X10 ST (GAUZE/BANDAGES/DRESSINGS) ×2 IMPLANT
DURAPREP 26ML APPLICATOR (WOUND CARE) ×2 IMPLANT
ELECT MENISCUS 165MM 90D (ELECTRODE) ×2 IMPLANT
ELECT REM PT RETURN 9FT ADLT (ELECTROSURGICAL) ×2
ELECTRODE REM PT RTRN 9FT ADLT (ELECTROSURGICAL) ×1 IMPLANT
FILTER STRAW FLUID ASPIR (MISCELLANEOUS) IMPLANT
GAUZE XEROFORM 1X8 LF (GAUZE/BANDAGES/DRESSINGS) IMPLANT
GAUZE XEROFORM 5X9 LF (GAUZE/BANDAGES/DRESSINGS) IMPLANT
GLOVE BIOGEL PI IND STRL 8 (GLOVE) ×1 IMPLANT
GLOVE BIOGEL PI INDICATOR 8 (GLOVE) ×1
GLOVE ORTHO TXT STRL SZ7.5 (GLOVE) ×2 IMPLANT
GOWN PREVENTION PLUS LG XLONG (DISPOSABLE) IMPLANT
GOWN STRL NON-REIN LRG LVL3 (GOWN DISPOSABLE) ×6 IMPLANT
KIT BASIN OR (CUSTOM PROCEDURE TRAY) ×2 IMPLANT
KIT BIO-TENODESIS 3X8 DISP (MISCELLANEOUS) ×1
KIT INSRT BABSR STRL DISP BTN (MISCELLANEOUS) ×1 IMPLANT
KIT ROOM TURNOVER OR (KITS) ×2 IMPLANT
MANIFOLD NEPTUNE II (INSTRUMENTS) ×2 IMPLANT
NDL SUT 6 .5 CRC .975X.05 MAYO (NEEDLE) IMPLANT
NEEDLE HYPO 25X1 1.5 SAFETY (NEEDLE) ×2 IMPLANT
NEEDLE MAYO TAPER (NEEDLE)
NEEDLE SPNL 18GX3.5 QUINCKE PK (NEEDLE) ×2 IMPLANT
NS IRRIG 1000ML POUR BTL (IV SOLUTION) ×2 IMPLANT
PACK SHOULDER (CUSTOM PROCEDURE TRAY) ×2 IMPLANT
PAD ARMBOARD 7.5X6 YLW CONV (MISCELLANEOUS) ×4 IMPLANT
SCREW BIO TENODEIS 7MM (Screw) ×2 IMPLANT
SET ARTHROSCOPY TUBING (MISCELLANEOUS) ×2
SET ARTHROSCOPY TUBING LN (MISCELLANEOUS) ×2 IMPLANT
SLING ARM IMMOBILIZER LRG (SOFTGOODS) ×2 IMPLANT
SLING ARM IMMOBILIZER MED (SOFTGOODS) IMPLANT
SPONGE GAUZE 4X4 12PLY (GAUZE/BANDAGES/DRESSINGS) IMPLANT
SPONGE LAP 4X18 X RAY DECT (DISPOSABLE) ×2 IMPLANT
STRIP CLOSURE SKIN 1/2X4 (GAUZE/BANDAGES/DRESSINGS) IMPLANT
SUCTION FRAZIER TIP 10 FR DISP (SUCTIONS) ×2 IMPLANT
SUT ETHILON 3 0 PS 1 (SUTURE) ×2 IMPLANT
SUT VIC AB 0 CT2 27 (SUTURE) IMPLANT
SUT VIC AB 2-0 CT1 27 (SUTURE)
SUT VIC AB 2-0 CT1 TAPERPNT 27 (SUTURE) IMPLANT
SUT VIC AB 3-0 SH 27 (SUTURE) ×1
SUT VIC AB 3-0 SH 27XBRD (SUTURE) ×1 IMPLANT
SUT VICRYL 4-0 PS2 18IN ABS (SUTURE) ×2 IMPLANT
SYR 20CC LL (SYRINGE) IMPLANT
SYR 3ML LL SCALE MARK (SYRINGE) IMPLANT
SYR TB 1ML LUER SLIP (SYRINGE) IMPLANT
TAPE CLOTH SURG 6X10 WHT LF (GAUZE/BANDAGES/DRESSINGS) ×2 IMPLANT
TOWEL OR 17X24 6PK STRL BLUE (TOWEL DISPOSABLE) ×2 IMPLANT
TOWEL OR 17X26 10 PK STRL BLUE (TOWEL DISPOSABLE) ×2 IMPLANT
WAND 90 DEG TURBOVAC W/CORD (SURGICAL WAND) ×2 IMPLANT
WATER STERILE IRR 1000ML POUR (IV SOLUTION) ×2 IMPLANT

## 2011-12-14 NOTE — H&P (Signed)
[name] is an 60 y.o. male.   Chief Complaint: painful right shoulder times more than 2 months failed conservative Tx.  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 COPD (chronic obstructive pulmonary disease)   . Coronary artery disease   . Myocardial infarction   . Hypertension   . Asthma     occ bronchitis  . GERD (gastroesophageal reflux disease)   . Arthritis   . Seasonal allergies     Past Surgical History  Procedure Date  . Coronary artery bypass graft 2007  . Coronary stent placement     x5 prior to cabg  . Tonsillectomy   . Colonoscopy   . Cardiac catheterization   . Knee arthroscopy     rt knee  . Acne cyst removal     Family History  Problem Relation Age of Onset  . Anesthesia problems Neg Hx    Social History:  reports that he has been smoking.  He does not have any smokeless tobacco history on file. He reports that he drinks alcohol. He reports that he does not use illicit drugs.  Allergies:  Allergies  Allergen Reactions  . Codeine Nausea And Vomiting    Medications Prior to Admission  Medication Dose Route Frequency Provider Last Rate Last Dose  . ceFAZolin (ANCEF) IVPB 2 g/50 mL premix  2 g Intravenous 60 min Pre-Op Eldred Manges, MD      . lactated ringers infusion   Intravenous Continuous Raiford Simmonds, MD 20 mL/hr at 12/14/11 1209    . metoprolol tartrate (LOPRESSOR) tablet 25 mg  25 mg Oral Once Eilene Ghazi, MD   25 mg at 12/14/11 1127  . technetium tetrofosmin (TC-MYOVIEW) injection 11 milli Curie  11 milli Curie Intravenous Once PRN [name], MD   11 milli Curie at 12/10/11 [phone]  . technetium tetrofosmin (TC-MYOVIEW) injection 33 milli Curie  33 milli Curie Intravenous Once PRN [name], MD   33 milli Curie at 12/10/11 1040   Medications Prior to Admission  Medication Sig Dispense Refill  .  aspirin 81 MG tablet Take 81 mg by mouth daily.      Marland Kitchen omeprazole (PRILOSEC) 20 MG capsule Take 20 mg by mouth daily.      . traMADol (ULTRAM) 50 MG tablet Take 50 mg by mouth every 6 (six) hours as needed.        No results found for this or any previous visit (from the past 48 hour(s)). No results found.  Review of Systems  Constitutional: Negative.   HENT:       Dentures   Eyes: Negative.   Respiratory: Negative.   Cardiovascular:       Recent cardiac clearance for surgery last week  Gastrointestinal: Negative.   Genitourinary: Negative.   Musculoskeletal:       Right shoulder pain  Skin: Negative.   Neurological: Negative.   Endo/Heme/Allergies: Negative.   Psychiatric/Behavioral: Negative.     Blood pressure 166/93, pulse 60, temperature 97.9 F (36.6 C), temperature source Oral, resp. rate 18, SpO2 98.00%. Physical Exam  Constitutional: He appears well-developed and well-nourished.  HENT:  Head: Normocephalic and atraumatic.  Mouth/Throat: Oropharynx is clear and moist.  Eyes: Pupils are equal, round, and reactive to light.  Neck: Normal range of motion. Neck supple.  Cardiovascular: Normal rate.  Respiratory: Effort normal.  GI: Soft. Bowel sounds are normal. There is no tenderness. There is no rebound.  Musculoskeletal:       right shoulder impingement  MRI AC degen changes with impingement , SLAP tear   Neurological: He is alert.  Skin: Skin is warm and dry.  Psychiatric: He has a normal mood and affect. His behavior is normal. Thought content normal.     Assessment/Plan       Right shoulder impingement with AC spurring and SLAP tear and failed conservative TX, . OP vs non op tx discussed. He requests we proceed.  Thekla Colborn C 12/14/2011, 12:53 PM

## 2011-12-14 NOTE — Transfer of Care (Signed)
Immediate Anesthesia Transfer of Care Note  Patient: [name]  Procedure(s) Performed: Procedure(s) (LRB): ARTHROSCOPY SHOULDER (Right)  Patient Location: PACU  Anesthesia Type: General  Level of Consciousness: awake, alert  and oriented  Airway & Oxygen Therapy: Patient Spontanous Breathing and Patient connected to face mask oxygen  Post-op Assessment: Report given to PACU RN and Post -op Vital signs reviewed and stable  Post vital signs: Reviewed and stable  Complications: No apparent anesthesia complications

## 2011-12-14 NOTE — Anesthesia Preprocedure Evaluation (Addendum)
Anesthesia Evaluation  Patient identified by MRN, date of birth, ID band Patient awake    Reviewed: Allergy & Precautions, H&P , NPO status , Patient's Chart, lab work & pertinent test results  Airway Mallampati: II TM Distance: <3 FB Neck ROM: Full    Dental No notable dental hx. (+) Edentulous Upper and Edentulous Lower   Pulmonary COPD breath sounds clear to auscultation  Pulmonary exam normal       Cardiovascular hypertension, + CAD, + Past MI and + CABG Rhythm:Regular Rate:Normal  EF 60%   Neuro/Psych negative neurological ROS  negative psych ROS   GI/Hepatic negative GI ROS, Neg liver ROS, GERD-  Controlled,  Endo/Other  negative endocrine ROS  Renal/GU negative Renal ROS  negative genitourinary   Musculoskeletal negative musculoskeletal ROS (+)   Abdominal   Peds negative pediatric ROS (+)  Hematology negative hematology ROS (+)   Anesthesia Other Findings   Reproductive/Obstetrics negative OB ROS                         Anesthesia Physical Anesthesia Plan  ASA: III  Anesthesia Plan: General   Post-op Pain Management:    Induction: Intravenous  Airway Management Planned: Oral ETT  Additional Equipment:   Intra-op Plan:   Post-operative Plan: Extubation in OR  Informed Consent: I have reviewed the patients History and Physical, chart, labs and discussed the procedure including the risks, benefits and alternatives for the proposed anesthesia with the patient or authorized representative who has indicated his/her understanding and acceptance.   Dental advisory given  Plan Discussed with: CRNA  Anesthesia Plan Comments:         Anesthesia Quick Evaluation

## 2011-12-14 NOTE — Preoperative (Signed)
Beta Blockers   Reason not to administer Beta Blockers:Not Applicable 

## 2011-12-14 NOTE — Anesthesia Postprocedure Evaluation (Signed)
  Anesthesia Post-op Note  Patient: [name]  Procedure(s) Performed: Procedure(s) (LRB): ARTHROSCOPY SHOULDER (Right)  Patient Location: PACU  Anesthesia Type: General  Level of Consciousness: awake, alert  and oriented  Airway and Oxygen Therapy: Patient Spontanous Breathing and Patient connected to nasal cannula oxygen  Post-op Pain: none  Post-op Assessment: Post-op Vital signs reviewed, Patient's Cardiovascular Status Stable, Respiratory Function Stable, Patent Airway and No signs of Nausea or vomiting  Post-op Vital Signs: Reviewed and stable  Complications: No apparent anesthesia complications

## 2011-12-14 NOTE — Brief Op Note (Signed)
12/14/2011  2:40 PM  PATIENT:  [name]  61 y.o. male  PRE-OPERATIVE DIAGNOSIS:  Right Shoulder Impingement  POST-OPERATIVE DIAGNOSIS:  Right Shoulder Inpingement with Superior Labral Anterior Posterior Tear   PROCEDURE:  Procedure(s) (LRB): ARTHROSCOPY SHOULDER (Right) arthroscopic debridement of degenerative labral tear, arthroscopic biceps tendon long head tendon release, arthroscopic DCE,  SAD, type 3 acromial spur resection,  Open biceps tenodesis with 7X 23mm biotenodesis screw.   Exam under anesthesia  SURGEON:  Surgeon(s) and Role:    * Eldred Manges, MD - Primary  PHYSICIAN ASSISTANT:   ASSISTANTS: none   ANESTHESIA:   local and general  EBL:  Total I/O In: 1000 [I.V.:1000] Out: -   BLOOD ADMINISTERED:none  DRAINS: none   LOCAL MEDICATIONS USED:  MARCAINE    and XYLOCAINE   SPECIMEN:  No Specimen  DISPOSITION OF SPECIMEN:  N/A  COUNTS:  YES  TOURNIQUET:  * No tourniquets in log *  DICTATION: .Other Dictation: Dictation Number [id number]  PLAN OF CARE: Admit for overnight observation or discharge home.   PATIENT DISPOSITION:  PACU - hemodynamically stable.   Delay start of Pharmacological VTE agent (>24hrs) due to surgical blood loss or risk of bleeding: not applicable

## 2011-12-14 NOTE — Interval H&P Note (Signed)
History and Physical Interval Note:  12/14/2011 12:58 PM  [name]  has presented today for surgery, with the diagnosis of Right Shoulder Impingement  The various methods of treatment have been discussed with the patient and family. After consideration of risks, benefits and other options for treatment, the patient has consented to  Procedure(s) (LRB): ARTHROSCOPY SHOULDER (Right) as a surgical intervention .  The patients' history has been reviewed, patient examined, no change in status, stable for surgery.  I have reviewed the patients' chart and labs.  Questions were answered to the patient's satisfaction.     Halbert Jesson C

## 2011-12-15 NOTE — Op Note (Signed)
NAMEZAION, HREHA               ACCOUNT NO.:  [id number]  MEDICAL RECORD NO.:  [id number]  LOCATION:  [facility]                         FACILITY:  [facility]  PHYSICIAN:  Flay Ghosh C. Ophelia Charter, M.D.    DATE OF BIRTH:  01-Mar-1951  DATE OF PROCEDURE:  12/14/2011 DATE OF DISCHARGE:  12/14/2011                              OPERATIVE REPORT   PREOPERATIVE DIAGNOSES:  Right shoulder impingement, acromioclavicular joint degenerative changes with superior labrum anterior and posterior tear.  POSTOPERATIVE DIAGNOSES:  Right shoulder impingement, acromioclavicular joint degenerative changes with superior labrum anterior and posterior tear.  PROCEDURE:  Right shoulder exam under anesthesia, diagnostic arthroscopy, debridement of superior labral tear, biceps tendon debridement, release from superior labrum, undersurface rotator cuff debridement.    Arthroscopic subacromial decompression.  Resection of type 3 acromial spur and distal clavicle spur debridement and partial distal clavicle excision.  Open biceps tenodesis with 7 x 23 Bio-Tenodesis screw.  SURGEON:  Eyvette Cordon C. Ophelia Charter, MD  ANESTHESIA:  General plus local anesthesia, 10 mL Xylocaine, 20 mL Marcaine, skin, subacromial and intra-articular.  SPECIMEN:  None.  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  He has had persistent problems with the shoulder with impingement, pains, failed conservative treatment including injections, anti-inflammatories, rest, physical therapy.  MRI scan shows large subacromial fluid collection, type 3 acromion with AC joint degenerative changes causing impingement. He has not had any particular AC joint degenerative problems, negative cross-arm abduction test, but did have significant anterior shoulder pain and pain with pulling outstretch reaching consistent with a SLAP tear.  PROCEDURE:  After induction of general anesthesia and orotracheal intubation, the patient was placed in the beach-chair  position; 2 g of Ancef prophylaxis was given, prepping with DuraPrep, impervious stockinette, Coban, split sheets, drapes, impervious stockinette, and arthroscopic pouch was applied.  Time-out procedure was completed. Scope was introduced from posterior approach.  Sequential exam of the shoulder was performed with exam under anesthesia checking internal- external rotation, inferior-posterior stability, and there was good stability of the shoulder.  Arthroscopic evaluation showed complex tear of the superior labrum with detachment of the superior labrum, extended from 10 o'clock to 2 o'clock position.  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  There was partial tearing of the biceps tendon as it anchored to the lateral labrum.  This was photographed.  Anterior portal was made inferior to the biceps tendon and debridement was performed with a 4.2 Cuda shaver.  Undersurface of the rotator cuff showed some articular surface fraying, this was smoothed.  Careful probing showed no full-thickness tear in the subscap or supraspinatus and then switching looking the posterior rotator cuff, it was similarly intact.  Scope was introduced to the subacromial space.  Anterolateral subacromial portal was made, blue cannula was used for pressure, pushing along the top of the rotator cuff as the scope was placed back in the shoulder looking inside and again, there was no tear.  Scope was then popped in the subacromial space.  There was significant amount of subacromial bursa tissue, which was debrided with the shaver using  combination of the ArthroCare wand.  Periosteum and small bleeders were controlled with the ArthroCare wand.  Significant anterior spur was debrided.  Distal aspect of the clavicle under direct visualization was prominent, it was resected as well.  The  patient did have AC joint degenerative changes on MRI scan, but it was felt that 2-cm distal clavicle was not necessary to resect since the patient's primary problem was impingement and the inferior spur was hanging down causing the impingement problem.  Once the spur was smoothed, palpation with the blue blunt probe was used as well as rasp to make sure that resection was smoothed and the 4.2 [place] and 6-mm bur switched between portals anterolateral and posterior to make sure resection was smooth and not angled.  Next, small incision was made directly over the bicipital groove after suctioning the subacromial space dry as best could be done.  Blunt dissection with the scissor tips down into the bicipital groove hooking the bicipital groove, biceps tendon palpable from medial and lateral with a right- angle clamp, popping and delivering it.  The self-retaining retractor was placed.  K-wire was drilled into the groove, a ring with 7-mm screws sizing of the tendon and banana weave with #2 FiberWire through the tendon after cutting off 22 mm.  Using the long screwdriver tip with the already loaded 7 x 23 Bio-Tenodesis screw, one end of the banana weave which was longest went through the tip of the screw, second was left out.  Tip was pushed with the tendon down into the hole, then the Bio- Tenodesis screw was brought in over the top screwing the tendon down securing it until the Bio-Tenodesis screw was buried and then tying the two ends to lock through repair down.  Elbow reached full extension, tendon was tight and then went directly into the hole and was secured. Irrigation with saline solution.  Subcutaneous tissue was reapproximated with 3-0 Vicryl and 4-0 Vicryl subcuticular closure.  Tincture of benzoin, Steri-Strips, Marcaine infiltration, nylon 3-0 placed in the portals, postop dressing, ABDs, and sling.  Marcaine was placed in the shoulder and subacromial space, 10 mL each.   Lidocaine was placed in the skin incisions, total of 10 mL, arthroscopic portals in subcutaneous tissue.  The patient tolerated the procedure well and transferred to the recovery care room in stable condition.  Plan is for going home less pain is probable, then he can stay overnight for observation.     Shunta Mclaurin C. Ophelia Charter, M.D.     MCY/MEDQ  D:  12/14/2011  T:  12/15/2011  Job:  [id number]

## 2011-12-16 ENCOUNTER — Encounter (HOSPITAL_COMMUNITY): Payer: Self-pay | Admitting: Orthopaedic Surgery

## 2012-03-12 ENCOUNTER — Other Ambulatory Visit: Payer: Self-pay | Admitting: Cardiology

## 2012-06-15 ENCOUNTER — Encounter: Payer: Self-pay | Admitting: Cardiology

## 2012-06-15 ENCOUNTER — Ambulatory Visit (INDEPENDENT_AMBULATORY_CARE_PROVIDER_SITE_OTHER): Payer: 59 | Admitting: Cardiology

## 2012-06-15 VITALS — BP 165/86 | HR 62 | Ht 70.0 in | Wt 223.0 lb

## 2012-06-15 DIAGNOSIS — I2510 Atherosclerotic heart disease of native coronary artery without angina pectoris: Secondary | ICD-10-CM

## 2012-06-15 DIAGNOSIS — E7800 Pure hypercholesterolemia, unspecified: Secondary | ICD-10-CM

## 2012-06-15 LAB — LIPID PANEL
Cholesterol: 172 mg/dL (ref 0–200)
HDL: 35.5 mg/dL — ABNORMAL LOW (ref >39.00)
LDL Cholesterol: 101 mg/dL — ABNORMAL HIGH (ref 0–99)
Triglycerides: 178 mg/dL — ABNORMAL HIGH (ref 0.0–149.0)
VLDL: 35.6 mg/dL (ref 0.0–40.0)

## 2012-06-15 LAB — HEPATIC FUNCTION PANEL
Albumin: 3.8 g/dL (ref 3.5–5.2)
Bilirubin, Direct: 0.1 mg/dL (ref 0.0–0.3)
Total Protein: 7.2 g/dL (ref 6.0–8.3)

## 2012-06-15 NOTE — Assessment & Plan Note (Signed)
Patient counseled on discontinuing. 

## 2012-06-15 NOTE — Assessment & Plan Note (Signed)
Continue aspirin and statin. Most recent Myoview for risk. Continue risk factor modification.

## 2012-06-15 NOTE — Patient Instructions (Addendum)
Your physician wants you to follow-up in: ONE YEAR WITH DR CRENSHAW You will receive a reminder letter in the mail two months in advance. If you don't receive a letter, please call our office to schedule the follow-up appointment.   Your physician recommends that you HAVE LAB WORK TODAY 

## 2012-06-15 NOTE — Assessment & Plan Note (Signed)
Blood pressure elevated but he has not taken his medications today. I encouraged him to follow his blood pressure at home. If his systolic is over 846 or his diastolic over 85 he will contact us and we will add additional medications as needed.

## 2012-06-15 NOTE — Assessment & Plan Note (Signed)
Continue statin. Check lipids and liver. 

## 2012-06-15 NOTE — Progress Notes (Signed)
HPI: Pleasant male for fu of CAD; He is s/p multiple prior PCIs, s/p subsequent CABG 06/2006 ( LIMA to the LAD, sequential saphenous vein graft to the intermediate and obtuse marginal, and a saphenous vein graft to the PDA). His last echocardiogram in February 2008 showed preserved LV function. Carotid Dopplers in April of 2005 showed normal arteries. ABIs for leg pain in 08/2010 were normal. Last myoview in April of 2013 revealed  EF 53 with normal perfusion. Since he was last seen, the patient has dyspnea with more extreme activities but not with routine activities. It is relieved with rest. It is not associated with chest pain. There is no orthopnea, PND or pedal edema. There is no syncope or palpitations. There is no exertional chest pain.    Current Outpatient Prescriptions  Medication Sig Dispense Refill  . aspirin 81 MG tablet Take 81 mg by mouth daily.      Marland Kitchen atorvastatin (LIPITOR) 80 MG tablet Take 80 mg by mouth at bedtime.      . diphenhydramine-acetaminophen (TYLENOL PM EXTRA STRENGTH) 25-500 MG TABS Take 2 tablets by mouth at bedtime as needed.      . metoprolol tartrate (LOPRESSOR) 25 MG tablet Take 25 mg by mouth 2 (two) times daily.      . traMADol (ULTRAM) 50 MG tablet Take 50 mg by mouth every 6 (six) hours as needed.      Marland Kitchen omeprazole (PRILOSEC) 20 MG capsule Take 20 mg by mouth daily.      Marland Kitchen DISCONTD: metoprolol tartrate (LOPRESSOR) 25 MG tablet TAKE ONE TABLET BY MOUTH TWICE DAILY  60 tablet  12     Past Medical History  Diagnosis Date  . COPD (chronic obstructive pulmonary disease)   . Coronary artery disease   . Myocardial infarction   . Hypertension   . Asthma     occ bronchitis  . GERD (gastroesophageal reflux disease)   . Arthritis   . Seasonal allergies     Past Surgical History  Procedure Date  . Coronary artery bypass graft 2007  . Coronary stent placement     x5 prior to cabg  . Tonsillectomy   . Colonoscopy   . Cardiac catheterization   . Knee  arthroscopy     rt knee  . Acne cyst removal   . Shoulder arthroscopy 12/14/2011    Procedure: ARTHROSCOPY SHOULDER;  Surgeon: Eldred Manges, MD;  Location: [facility] OR;  Service: Orthopedics;  Laterality: Right;  Right Shoulder Arthroscopy, Subacromial Decompression, Distal Clavicle Excision, Open Biceps Tenodesis Repair    History   Social History  . Marital Status: Married    Spouse Name: N/A    Number of Children: N/A  . Years of Education: N/A   Occupational History  . Not on file.   Social History Main Topics  . Smoking status: Current Every Day Smoker -- 1.5 packs/day  . Smokeless tobacco: Not on file  . Alcohol Use: Yes     occasional  . Drug Use: No  . Sexually Active:    Other Topics Concern  . Not on file   Social History Narrative  . No narrative on file    ROS: some right shoulder pain but no fevers or chills, productive cough, hemoptysis, dysphasia, odynophagia, melena, hematochezia, dysuria, hematuria, rash, seizure activity, orthopnea, PND, pedal edema, claudication. Remaining systems are negative.  Physical Exam: Well-developed well-nourished in no acute distress.  Skin is warm and dry.  HEENT is normal.  Neck is supple.  Chest is clear to auscultation with normal expansion. Previous sternotomy Cardiovascular exam is regular rate and rhythm.  Abdominal exam nontender or distended. No masses palpated. Extremities show no edema. neuro grossly intact  ECG sinus rhythm at a rate of 62. Normal axis. Nonspecific ST changes.

## 2012-06-20 ENCOUNTER — Encounter: Payer: Self-pay | Admitting: *Deleted

## 2012-10-07 ENCOUNTER — Emergency Department (HOSPITAL_COMMUNITY)
Admission: EM | Admit: 2012-10-07 | Discharge: 2012-10-07 | Disposition: A | Discharge status: Home / Self Care | Payer: 59 | Source: Home / Self Care

## 2012-10-07 ENCOUNTER — Encounter (HOSPITAL_COMMUNITY): Payer: Self-pay | Admitting: *Deleted

## 2012-10-07 ENCOUNTER — Emergency Department (HOSPITAL_COMMUNITY): Payer: 59

## 2012-10-07 ENCOUNTER — Emergency Department (INDEPENDENT_AMBULATORY_CARE_PROVIDER_SITE_OTHER): Payer: 59

## 2012-10-07 DIAGNOSIS — M25511 Pain in right shoulder: Secondary | ICD-10-CM

## 2012-10-07 DIAGNOSIS — M25519 Pain in unspecified shoulder: Secondary | ICD-10-CM

## 2012-10-07 MED ORDER — DICLOFENAC SODIUM 1 % TD GEL: 2.0000 g | Freq: Four times a day (QID) | TRANSDERMAL | Status: DC

## 2012-10-07 MED ORDER — TRAMADOL HCL 50 MG PO TBDP: 1.0000 | ORAL_TABLET | Freq: Three times a day (TID) | ORAL | Status: DC | PRN

## 2012-10-07 NOTE — ED Provider Notes (Signed)
Medical screening examination/treatment/procedure(s) were performed by resident physician or non-physician practitioner and as supervising physician I was immediately available for consultation/collaboration.   Tatem Holsonback DOUGLAS MD.    Roshawn Ayala D Lumi Winslett, MD 10/07/12 2031 

## 2012-10-07 NOTE — ED Provider Notes (Signed)
History     CSN: [id number]  Arrival date & time 10/07/12  1330   First MD Initiated Contact with Patient 10/07/12 1420      Chief Complaint  Patient presents with  . Shoulder Pain   Patient is a 62 y.o. male presenting with shoulder pain. The history is provided by the patient.  Shoulder Pain This is a new problem. The current episode started yesterday. The problem occurs constantly. The problem has been gradually worsening. Pertinent negatives include no chest pain, no abdominal pain, no headaches and no shortness of breath. Exacerbated by: moving arm any position. The symptoms are relieved by ice, heat, NSAIDs and rest. He has tried a cold compress, a warm compress and rest for the symptoms. The treatment provided mild relief.   A lot of working over head 2 days ago and slowly progressive diffuse shoulder pain 9/10 cramping/muscle pull feeling since that time. 2/10 if he rests the arm and doesn't move it. History of shoulder surgery with reported muscle repair with [facility] orthopedist.   Concerning HTN-no Cp/SOB/blurry vision/HA. Possibly pain related.   Past Medical History  Diagnosis Date  . COPD (chronic obstructive pulmonary disease)   . Coronary artery disease   . Myocardial infarction   . Hypertension   . Asthma     occ bronchitis  . GERD (gastroesophageal reflux disease)   . Arthritis   . Seasonal allergies     Past Surgical History  Procedure Date  . Coronary artery bypass graft 2007  . Coronary stent placement     x5 prior to cabg  . Tonsillectomy   . Colonoscopy   . Cardiac catheterization   . Knee arthroscopy     rt knee  . Acne cyst removal   . Shoulder arthroscopy 12/14/2011    removed bone spurs and repositioned muscle.     Family History  Problem Relation Age of Onset  . Anesthesia problems Neg Hx   . Coronary artery disease Father     brothers, sisters    History  Substance Use Topics  . Smoking status: Current Every Day Smoker -- 1.5 packs/day   . Smokeless tobacco: Not on file  . Alcohol Use: Yes     Comment: occasional      Review of Systems  Constitutional: Negative for fever, activity change and appetite change.  HENT: Negative for neck pain and neck stiffness.   Eyes: Negative for discharge and itching.  Respiratory: Negative for shortness of breath and wheezing.   Cardiovascular: Negative for chest pain and palpitations.  Gastrointestinal: Negative for abdominal pain and abdominal distention.  Genitourinary: Negative for dysuria and difficulty urinating.  Musculoskeletal: Positive for myalgias and arthralgias. Negative for back pain.  Skin: Negative for color change and pallor.  Neurological: Negative for dizziness and headaches.  Hematological: Negative for adenopathy. Does not bruise/bleed easily.  Psychiatric/Behavioral: Negative for confusion and agitation.    Allergies  Codeine  Home Medications   Current Outpatient Rx  Name  Route  Sig  Dispense  Refill  . ASPIRIN 81 MG PO TABS   Oral   Take 81 mg by mouth daily.         . ATORVASTATIN CALCIUM 80 MG PO TABS   Oral   Take 80 mg by mouth at bedtime.         Marland Kitchen DIPHENHYDRAMINE-APAP (SLEEP) 25-500 MG PO TABS   Oral   Take 2 tablets by mouth at bedtime as needed.         Marland Kitchen  METOPROLOL TARTRATE 25 MG PO TABS   Oral   Take 25 mg by mouth 2 (two) times daily.         Marland Kitchen OMEPRAZOLE 20 MG PO CPDR   Oral   Take 20 mg by mouth daily.         Zyrtec.   BP 193/95  Pulse 65  Temp 98.6 F (37 C) (Oral)  Resp 18  SpO2 97%  Physical Exam  Constitutional: He is oriented to person, place, and time. He appears well-developed and well-nourished. He appears distressed (only with movement of arm).  HENT:  Head: Normocephalic and atraumatic.  Eyes: EOM are normal. Pupils are equal, round, and reactive to light.  Neck: Normal range of motion. Neck supple.  Cardiovascular: Normal rate and regular rhythm.  Exam reveals no gallop and no friction rub.    No murmur heard. Pulmonary/Chest: Effort normal and breath sounds normal. He has no wheezes. He has no rales.  Abdominal: Soft. Bowel sounds are normal.  Musculoskeletal: Normal range of motion. He exhibits no edema.       Shoulder: Inspection reveals no abnormalities, atrophy or asymmetry. Palpation reveals tenderness along AC joint and bicipital groove. . ROM limited to 30 degrees in forward flexion and abduction.  Strength of rotator cuff limited by pain.  Passive ROM full but pain illicited.  Rotator cuff strength normal throughout. Cannot lift arm to perform empty can. Requests not to try Neer, hawkins after already having moved in abduction.  Speeds (cant perform) and Yergason's tests painful.  Normal scapular function observed. Painful arc noted. Pain with internal and external rotation and strength limited by pain.  Negative Spurling test.   Neurological: He is alert and oriented to person, place, and time.  Skin: Skin is dry.    ED Course  Procedures (including critical care time)  Labs Reviewed - No data to display Dg Shoulder Right  10/07/2012  *RADIOLOGY REPORT*  Clinical Data: Right shoulder pain.  RIGHT SHOULDER - 2+ VIEW  Comparison: Shoulder MRI 11/27/2011.  Findings: Moderate AC joint osteoarthritis with superior surface spurring.  Internal and external rotation views appear within normal limits.  There is a circular lucency in the proximal humerus, likely representing biceps long head tenodesis shoulder is located.  Incidental visualization of the right chest is within normal limits. Shoulder is located.  No fracture.  IMPRESSION: No acute osseous abnormality.  Probable subacromial debridement and biceps long head tenodesis.   Original Report Authenticated By: Andreas Newport, M.D.      1. Right shoulder pain    MDM  Concern for impingement syndrome, biceps tendonitis (see x-ray findings), or rotator cuff pathology. Patient to follow up with orthpedist on Monday.  Over the weekend, to use arm sling, voltaren gel for pain with tramadol for breakthrough pain.  Likely will need ultrasound of rotator cuff and biceps tendon. Concern for development of frozen shoulder but also want to control pain.   Elevated blood pressure likely due to pain as well as missed doses of medication patient reported. Encouraged patient to take medication regularly and follow up with cardiologist (doesnt have PCP).   Dr. Artis Flock has seen and evaluated the patient. We have discussed the history, exam, assessment, and plan as noted above. He agrees with management.   Aldine Contes. Marti Sleigh, MD, PGY2 10/07/2012 6:55 PM           Shelva Majestic, MD 10/07/12 [phone]

## 2012-10-07 NOTE — ED Notes (Signed)
Pt  Reports  Pain r  Shoulder w orse on  Movement      denuys  specfic  Injury     But was  Using  Shoulder a  Lot     At  Work  Recently

## 2012-12-10 ENCOUNTER — Other Ambulatory Visit: Payer: Self-pay | Admitting: Cardiology

## 2013-01-27 ENCOUNTER — Other Ambulatory Visit: Payer: Self-pay | Admitting: Cardiology

## 2013-03-27 ENCOUNTER — Other Ambulatory Visit: Payer: Self-pay | Admitting: Cardiology

## 2013-03-28 ENCOUNTER — Other Ambulatory Visit: Payer: Self-pay

## 2013-03-28 MED ORDER — ATORVASTATIN CALCIUM 80 MG PO TABS: ORAL_TABLET | ORAL | Status: DC

## 2013-04-21 ENCOUNTER — Encounter (HOSPITAL_COMMUNITY): Payer: Self-pay

## 2013-04-21 ENCOUNTER — Emergency Department (INDEPENDENT_AMBULATORY_CARE_PROVIDER_SITE_OTHER): Payer: 59

## 2013-04-21 ENCOUNTER — Emergency Department (HOSPITAL_COMMUNITY)
Admission: EM | Admit: 2013-04-21 | Discharge: 2013-04-21 | Disposition: A | Discharge status: Home / Self Care | Payer: 59 | Source: Home / Self Care

## 2013-04-21 DIAGNOSIS — F17200 Nicotine dependence, unspecified, uncomplicated: Secondary | ICD-10-CM

## 2013-04-21 DIAGNOSIS — J438 Other emphysema: Secondary | ICD-10-CM

## 2013-04-21 DIAGNOSIS — R42 Dizziness and giddiness: Secondary | ICD-10-CM

## 2013-04-21 DIAGNOSIS — Z720 Tobacco use: Secondary | ICD-10-CM

## 2013-04-21 DIAGNOSIS — J439 Emphysema, unspecified: Secondary | ICD-10-CM

## 2013-04-21 LAB — POCT I-STAT, CHEM 8
Chloride: 105 mEq/L (ref 96–112)
HCT: 46 % (ref 39.0–52.0)
Hemoglobin: 15.6 g/dL (ref 13.0–17.0)
Potassium: 4.1 mEq/L (ref 3.5–5.1)
Sodium: 140 mEq/L (ref 135–145)

## 2013-04-21 MED ORDER — IPRATROPIUM-ALBUTEROL 0.5-2.5 (3) MG/3ML IN SOLN
3.0000 mL | Freq: Once | RESPIRATORY_TRACT | Status: AC
  Administered 2013-04-21: 3 mL via RESPIRATORY_TRACT

## 2013-04-21 MED ORDER — ALBUTEROL SULFATE (5 MG/ML) 0.5% IN NEBU
INHALATION_SOLUTION | RESPIRATORY_TRACT | Status: AC
  Filled 2013-04-21: qty 1

## 2013-04-21 NOTE — ED Notes (Signed)
appt to see Dr Evangeline Gula in October. Has been c/o weakness in knees and BP elevated ; had instructed by MD in [facility] to stop benazepril, since he was weak

## 2013-04-21 NOTE — ED Provider Notes (Signed)
CSN: [id number]     Arrival date & time 04/21/13  1552 History     First MD Initiated Contact with Patient 04/21/13 1740     Chief Complaint  Patient presents with  . Dizziness   (Consider location/radiation/quality/duration/timing/severity/associated sxs/prior Treatment) HPI    62 yo wm presents today with the above complaint.  States that yesterday he began feeling dizzy, and feeling weak.  Some dyspnea.  BP has been elevated.  Followed by cardiologist Dr [name] but has not seen him since oct 2013.  Did not call his office regarding new symptoms.  Says that he just doesn't feel right.  Says that he has also had a headache.  No photophobia.  Continues to smoke.  Denies fever, chills, nausea, vomiting, cough.    Past Medical History  Diagnosis Date  . COPD (chronic obstructive pulmonary disease)   . Coronary artery disease   . Myocardial infarction   . Hypertension   . Asthma     occ bronchitis  . GERD (gastroesophageal reflux disease)   . Arthritis   . Seasonal allergies    Past Surgical History  Procedure Laterality Date  . Coronary artery bypass graft  2007  . Coronary stent placement      x5 prior to cabg  . Tonsillectomy    . Colonoscopy    . Cardiac catheterization    . Knee arthroscopy      rt knee  . Acne cyst removal    . Shoulder arthroscopy  12/14/2011    removed bone spurs and repositioned muscle.    Family History  Problem Relation Age of Onset  . Anesthesia problems Neg Hx   . Coronary artery disease Father     brothers, sisters   History  Substance Use Topics  . Smoking status: Current Every Day Smoker -- 1.50 packs/day  . Smokeless tobacco: Not on file  . Alcohol Use: Yes     Comment: occasional    Review of Systems  Constitutional: Positive for fatigue. Negative for fever, chills, activity change and appetite change.  HENT: Negative for hearing loss, ear pain, congestion, rhinorrhea, neck pain, postnasal drip and sinus pressure.   Eyes:  Negative.   Respiratory: Positive for shortness of breath. Negative for chest tightness, wheezing and stridor.   Cardiovascular: Negative for chest pain, palpitations and leg swelling.  Gastrointestinal: Negative.   Genitourinary: Negative.   Musculoskeletal: Negative.   Skin: Negative.   Allergic/Immunologic: Negative.   Neurological: Positive for dizziness, weakness, light-headedness and headaches. Negative for tremors, seizures, syncope, speech difficulty and numbness.  Psychiatric/Behavioral: Negative.     Allergies  Codeine  Home Medications   Current Outpatient Rx  Name  Route  Sig  Dispense  Refill  . aspirin 81 MG tablet   Oral   Take 81 mg by mouth daily.         Marland Kitchen atorvastatin (LIPITOR) 80 MG tablet      TAKE ONE TABLET BY MOUTH ONCE DAILY   30 tablet   6   . diclofenac sodium (VOLTAREN) 1 % GEL   Topical   Apply 2 g topically 4 (four) times daily. To right shoulder for pain.   100 g   0   . diphenhydramine-acetaminophen (TYLENOL PM EXTRA STRENGTH) 25-500 MG TABS   Oral   Take 2 tablets by mouth at bedtime as needed.         . metoprolol tartrate (LOPRESSOR) 25 MG tablet   Oral   Take 25 mg  by mouth 2 (two) times daily.         Marland Kitchen omeprazole (PRILOSEC) 20 MG capsule   Oral   Take 20 mg by mouth daily.         . TraMADol HCl 50 MG TBDP   Oral   Take 1 tablet by mouth every 8 (eight) hours as needed (pain).   30 tablet   0    BP 148/74  Pulse 58  Temp(Src) 98.1 F (36.7 C) (Oral)  Resp 14  SpO2 97% Physical Exam  Constitutional: He is oriented to person, place, and time. He appears well-developed and well-nourished. No distress.  HENT:  Head: Normocephalic and atraumatic.  Nose: Nose normal.  Eyes: EOM are normal. Pupils are equal, round, and reactive to light.  Neck: Normal range of motion.  Cardiovascular: Normal rate.   Bradycardic.   Pulmonary/Chest: Effort normal. No respiratory distress. He has no wheezes. He has no rales. He  exhibits no tenderness.  Abdominal: Soft.  Musculoskeletal: Normal range of motion.  Neurological: He is alert and oriented to person, place, and time. No cranial nerve deficit. He exhibits normal muscle tone. Coordination normal.  Skin: Skin is warm and dry.  Psychiatric: He has a normal mood and affect. His behavior is normal. Judgment and thought content normal.    ED Course   Procedures (including critical care time)  Labs Reviewed  POCT I-STAT, CHEM 8 - Abnormal; Notable for the following:    Glucose, Bld 105 (*)    All other components within normal limits   Dg Chest 2 View  04/21/2013   *RADIOLOGY REPORT*  Clinical Data: Dizziness  CHEST - 2 VIEW  Comparison: December 11, 2011  Findings: There is underlying emphysematous change.  There is scarring in the left upper lobe anteriorly.  There is no edema or consolidation.  The heart size is normal.  Pulmonary vascularity reflects underlying emphysema.  The patient is status post coronary artery bypass grafting.  There are no bone lesions.  No adenopathy.  IMPRESSION: Underlying emphysema.  Stable scarring anterior segment left upper lobe.  No edema or consolidation.   Original Report Authenticated By: Bretta Bang, M.D.   1. Lightheadedness   2. Emphysema   3. Nicotine abuse     MDM  Patient given a duoneb treatment and states that his breathing feels better.  Continued to complain of feeling dizzy/lightheaded with head pressure.  Dr Denyse Amass and I recommended that he go to the ED for further evaluation but he declined.  States that if symptoms worsen that he will go later or over the weekend.  Advised him that we cannot guarantee that he does not have a more serious issue going on without further evaluation and he voices understanding.  All questions answered.   Zonia Kief, PA-C 04/21/13 2156

## 2013-04-23 NOTE — ED Provider Notes (Signed)
Medical screening examination/treatment/procedure(s) were performed by a resident physician or non-physician practitioner and as the supervising physician I was immediately available for consultation/collaboration.  Clementeen Graham, MD   Rodolph Bong, MD 04/23/13 [phone]

## 2013-04-24 ENCOUNTER — Telehealth: Payer: Self-pay | Admitting: Cardiology

## 2013-04-24 NOTE — Telephone Encounter (Signed)
Schedule fuov Brian Crenshaw  

## 2013-04-24 NOTE — Telephone Encounter (Signed)
New problem..  Pt wife states he needs an appointment today// advised ER// pt didn't want to go to the ER preferred to come here.

## 2013-04-24 NOTE — Telephone Encounter (Signed)
Spoke with pt, Aware of dr Ludwig Clarks recommendations. He will see scott weaver pac tomorrow afternoon. Pt agreed

## 2013-04-24 NOTE — Telephone Encounter (Signed)
Spoke with pt, early Friday morning while working the pt suddenly got dizzy, sl headache and legs became weak. He stopped working and was able to check his bp on machine at [employer]. The first reading was 131. He cont to feel bad and checked his bp several more times and they were all around 197/95. He drank some Gatorade. Since Friday he cont to have a light headache, feels fizzy and some leg weakness. He reports it is like he has a mild hangover. He did go to the ER Friday afternoon. Everything checked out okay. He has not checked his bp since then. He reports everyday the symptoms are getting better. He is afraid to return to work without being seen. meds confirmed, but he takes the lopressor just once daily. Pt denies chest pain or SOB. Will forward for dr [name] review

## 2013-04-25 ENCOUNTER — Ambulatory Visit (INDEPENDENT_AMBULATORY_CARE_PROVIDER_SITE_OTHER): Payer: 59 | Admitting: Physician Assistant

## 2013-04-25 ENCOUNTER — Encounter: Payer: Self-pay | Admitting: Physician Assistant

## 2013-04-25 VITALS — BP 160/102 | HR 56 | Ht 70.0 in | Wt 222.0 lb

## 2013-04-25 DIAGNOSIS — E7800 Pure hypercholesterolemia, unspecified: Secondary | ICD-10-CM

## 2013-04-25 DIAGNOSIS — I2510 Atherosclerotic heart disease of native coronary artery without angina pectoris: Secondary | ICD-10-CM

## 2013-04-25 DIAGNOSIS — F17200 Nicotine dependence, unspecified, uncomplicated: Secondary | ICD-10-CM

## 2013-04-25 DIAGNOSIS — I10 Essential (primary) hypertension: Secondary | ICD-10-CM

## 2013-04-25 DIAGNOSIS — R42 Dizziness and giddiness: Secondary | ICD-10-CM

## 2013-04-25 MED ORDER — AMLODIPINE BESYLATE 5 MG PO TABS: 5.0000 mg | ORAL_TABLET | Freq: Every day | ORAL | Status: DC

## 2013-04-25 MED ORDER — METOPROLOL SUCCINATE ER 50 MG PO TB24: 50.0000 mg | ORAL_TABLET | Freq: Every day | ORAL | Status: DC

## 2013-04-25 NOTE — Progress Notes (Signed)
[address] [place], [place]  [address] Phone: [phone] Fax:  [phone]  Date:  04/25/2013   ID:  [name], DOB 1951-05-07, MRN [id number]  PCP:  No PCP Per Patient  Cardiologist:  Dr. Olga Millers     History of Present Illness: [name] is a 62 y.o. male who returns for evaluation of dizziness.  He has a history of coronary disease, s/p multiple prior PCIs, s/p subsequent CABG 06/2006 (LIMA to the LAD, sequential saphenous vein graft to the intermediate and obtuse marginal, and a saphenous vein graft to the PDA). His last echocardiogram in February 2008 showed preserved LV function. Carotid Dopplers in April of 2005 showed normal arteries. ABIs for leg pain in 08/2010 were normal. ETT-Myoview 4/13:  No ischemia, EF 53%, low risk.  He was recently seen in urgent care with dizziness. His blood pressure was apparently 197/95. Workup at urgent care was unremarkable.  He returns for follow up.  He started feeling dizzy/lightheaded over the weekend. He works on [employer] and spatial [employer] and was worried about continuing to work at significant heights. He denies chest discomfort, shortness breath, syncope. He denies orthopnea, PND or edema. He denies orthostatic symptoms.   Labs (4/13):   K 4.4, Cr 0.92, ALT 19 Labs (10/13): ALT 30, HDL 35.5, LDL 101.1 Labs (6/96):   K 4.1, Cr 1.00, Hgb 15.6  Wt Readings from Last 3 Encounters:  04/25/13 222 lb (100.699 kg)  06/15/12 223 lb (101.152 kg)  12/11/11 221 lb (100.245 kg)     Past Medical History  Diagnosis Date  . COPD (chronic obstructive pulmonary disease)   . Myocardial infarction   . Hypertension   . Asthma     occ bronchitis  . GERD (gastroesophageal reflux disease)   . Arthritis   . Seasonal allergies   . Coronary artery disease     a. s/p multiple prior PCIs; b. s/p CABG 06/2006 (L-LAD, S-Int and OM, S-PDA);  c. ETT-Myoview 4/13: low risk, no ischemia, EF 53%  . Hx of echocardiogram     Echo  10/2006: normal LVF  . Hyperlipidemia     Past Surgical History  Procedure Laterality Date  . Coronary artery bypass graft  2007  . Coronary stent placement      x5 prior to cabg  . Tonsillectomy    . Colonoscopy    . Cardiac catheterization    . Knee arthroscopy      rt knee  . Acne cyst removal    . Shoulder arthroscopy  12/14/2011    removed bone spurs and repositioned muscle.      Current Outpatient Prescriptions  Medication Sig Dispense Refill  . aspirin 81 MG tablet Take 81 mg by mouth daily.      Marland Kitchen atorvastatin (LIPITOR) 80 MG tablet TAKE ONE TABLET BY MOUTH ONCE DAILY  30 tablet  6  . diclofenac sodium (VOLTAREN) 1 % GEL Apply 2 g topically 4 (four) times daily. To right shoulder for pain.  100 g  0  . diphenhydramine-acetaminophen (TYLENOL PM EXTRA STRENGTH) 25-500 MG TABS Take 2 tablets by mouth at bedtime as needed.      . metoprolol tartrate (LOPRESSOR) 25 MG tablet Take 25 mg by mouth 2 (two) times daily.      . naproxen sodium (ANAPROX) 220 MG tablet Take 220 mg by mouth 2 (two) times daily with a meal. TAKE TWO TABS EVERY 12 HOURS FOR PAIN      .  omeprazole (PRILOSEC) 20 MG capsule Take 20 mg by mouth daily.       No current facility-administered medications for this visit.    Allergies:    Allergies  Allergen Reactions  . Codeine Nausea And Vomiting    Social History:  The patient  reports that he has been smoking.  He does not have any smokeless tobacco history on file. He reports that  drinks alcohol. He reports that he does not use illicit drugs.   ROS:  Please see the history of present illness.      All other systems reviewed and negative.   PHYSICAL EXAM: VS:  BP 160/102  Pulse 56  Ht 5\' 10"  (1.778 m)  Wt 222 lb (100.699 kg)  BMI 31.85 kg/m2 Well nourished, well developed, in no acute distress HEENT: normal Neck: no JVD Vascular: No carotid bruits bilaterally Cardiac:  normal S1, S2; RRR; no murmur Lungs:  clear to auscultation bilaterally, no  wheezing, rhonchi or rales Abd: soft, nontender, no hepatomegaly Ext: no edema Skin: warm and dry Neuro:  CNs 2-12 intact, no focal abnormalities noted  EKG:  Sinus bradycardia, HR 56, normal axis, nonspecific ST-T wave changes     ASSESSMENT AND PLAN:  1. Dizziness: I believe that he is symptomatic from uncontrolled hypertension. 2. Hypertension: He often forgets to take his evening dose of Lopressor. I will discontinue Lopressor and start him on Toprol-XL 50 mg daily. He had some type of untoward reaction to benazepril in the past. I will start him on amlodipine 5 mg daily.  He will keep an eye on his blood pressures at home and call us with an update in 2 weeks. 3. CAD: No angina. Recent Myoview low risk. Continue aspirin and statin. 4. DJD: He uses a significant amount of naproxen for pain control. I have asked him to limit this as much as possible and use Tylenol instead due to his uncontrolled blood pressure. 5. Hyperlipidemia: Continue statin. 6. Tobacco abuse: He is trying to quit. 7. Disposition: Keep follow up with Dr. [name] 06/2013.  Signed, Tereso Newcomer, PA-C  04/25/2013 3:04 PM

## 2013-04-25 NOTE — Patient Instructions (Signed)
STOP LOPRESSOR (METOPROLOL TARTRATE)  START TOPROL XL 50 MG DAILY; NEW RX WAS SENT IN TODAY  DECREASE THE USE OF NAPROXEN AND ONLY TAKE RARELY AS NEEDED; TRY TO SWITCH OVER TO TYLENOL INSTEAD  CHECK YOUR BLOOD PRESSURE FOR THE NEXT 2 WEEKS AND CALL us WITH THE READINGS [phone] SCOTT WEAVER, PAC  MAKE SURE TO KEEP YOUR APPT WITH DR. CRENSHAW IN 06/2013

## 2013-06-04 ENCOUNTER — Encounter (HOSPITAL_COMMUNITY): Payer: Self-pay | Admitting: *Deleted

## 2013-06-04 ENCOUNTER — Emergency Department (HOSPITAL_COMMUNITY)
Admission: EM | Admit: 2013-06-04 | Discharge: 2013-06-04 | Disposition: A | Discharge status: Home / Self Care | Payer: 59 | Source: Home / Self Care | Attending: Family Medicine | Admitting: Family Medicine

## 2013-06-04 DIAGNOSIS — J019 Acute sinusitis, unspecified: Secondary | ICD-10-CM

## 2013-06-04 MED ORDER — METHYLPREDNISOLONE 4 MG PO KIT: PACK | ORAL | Status: DC

## 2013-06-04 MED ORDER — FLUTICASONE PROPIONATE 50 MCG/ACT NA SUSP: 2.0000 | Freq: Every day | NASAL | Status: DC

## 2013-06-04 MED ORDER — AMOXICILLIN-POT CLAVULANATE 875-125 MG PO TABS: 1.0000 | ORAL_TABLET | Freq: Two times a day (BID) | ORAL | Status: DC

## 2013-06-04 NOTE — ED Provider Notes (Signed)
CSN: [id number]     Arrival date & time 06/04/13  1359 History   First MD Initiated Contact with Patient 06/04/13 1438     Chief Complaint  Patient presents with  . Sinusitis   (Consider location/radiation/quality/duration/timing/severity/associated sxs/prior Treatment) HPI Comments: 62 year old male presents complaining of 2 weeks of nasal congestion and sinus pressure, mostly on the left.  He has tried taking sudafed, alka seltzer sinus, dayquil, Afrin without relief. This is mostly a feeling of nasal congestion and sinus pressure, although he does have occasional pain in the left paranasal sinuses. He has a slight sore throat as well. He denies fever, chills, or any other systemic symptoms. He does have a history of sinus infections.    Patient is a 62 y.o. male presenting with sinusitis.  Sinusitis Associated symptoms: congestion and sore throat   Associated symptoms: no chest pain, no chills, no cough, no fatigue, no fever, no nausea, no shortness of breath and no vomiting     Past Medical History  Diagnosis Date  . COPD (chronic obstructive pulmonary disease)   . Myocardial infarction   . Hypertension   . Asthma     occ bronchitis  . GERD (gastroesophageal reflux disease)   . Arthritis   . Seasonal allergies   . Coronary artery disease     a. s/p multiple prior PCIs; b. s/p CABG 06/2006 (L-LAD, S-Int and OM, S-PDA);  c. ETT-Myoview 4/13: low risk, no ischemia, EF 53%  . Hx of echocardiogram     Echo 10/2006: normal LVF  . Hyperlipidemia    Past Surgical History  Procedure Laterality Date  . Coronary artery bypass graft  2007  . Coronary stent placement      x5 prior to cabg  . Tonsillectomy    . Colonoscopy    . Cardiac catheterization    . Knee arthroscopy      rt knee  . Acne cyst removal    . Shoulder arthroscopy  12/14/2011    removed bone spurs and repositioned muscle.    Family History  Problem Relation Age of Onset  . Anesthesia problems Neg Hx   . Coronary  artery disease Father     brothers, sisters   History  Substance Use Topics  . Smoking status: Current Every Day Smoker -- 1.00 packs/day    Types: Cigarettes  . Smokeless tobacco: Not on file  . Alcohol Use: Yes     Comment: occasional    Review of Systems  Constitutional: Negative for fever, chills and fatigue.  HENT: Positive for congestion, sore throat and sinus pressure. Negative for neck pain and neck stiffness.   Eyes: Negative for visual disturbance.  Respiratory: Negative for cough and shortness of breath.   Cardiovascular: Negative for chest pain, palpitations and leg swelling.  Gastrointestinal: Negative for nausea, vomiting, abdominal pain, diarrhea and constipation.  Genitourinary: Negative for dysuria, urgency, frequency and hematuria.  Musculoskeletal: Negative for myalgias and arthralgias.  Skin: Negative for rash.  Neurological: Negative for dizziness, weakness and light-headedness.    Allergies  Codeine  Home Medications   Current Outpatient Rx  Name  Route  Sig  Dispense  Refill  . amLODipine (NORVASC) 5 MG tablet   Oral   Take 1 tablet (5 mg total) by mouth daily.   30 tablet   11   . aspirin 81 MG tablet   Oral   Take 81 mg by mouth daily.         Marland Kitchen atorvastatin (LIPITOR) 80  MG tablet      TAKE ONE TABLET BY MOUTH ONCE DAILY   30 tablet   6   . cetirizine (ZYRTEC) 10 MG tablet   Oral   Take 10 mg by mouth daily.         . diphenhydramine-acetaminophen (TYLENOL PM EXTRA STRENGTH) 25-500 MG TABS   Oral   Take 2 tablets by mouth at bedtime as needed.         . metoprolol succinate (TOPROL-XL) 50 MG 24 hr tablet   Oral   Take 1 tablet (50 mg total) by mouth daily. Take with or immediately following a meal.   30 tablet   11   . omeprazole (PRILOSEC) 20 MG capsule   Oral   Take 20 mg by mouth daily.         Marland Kitchen amoxicillin-clavulanate (AUGMENTIN) 875-125 MG per tablet   Oral   Take 1 tablet by mouth every 12 (twelve) hours.    14 tablet   0   . diclofenac sodium (VOLTAREN) 1 % GEL   Topical   Apply 2 g topically 4 (four) times daily. To right shoulder for pain.   100 g   0   . fluticasone (FLONASE) 50 MCG/ACT nasal spray   Nasal   Place 2 sprays into the nose daily.   1 g   2   . methylPREDNISolone (MEDROL DOSEPAK) 4 MG tablet      follow package directions   21 tablet   0     Dispense as written.   . naproxen sodium (ANAPROX) 220 MG tablet   Oral   Take 220 mg by mouth 2 (two) times daily with a meal. TAKE TWO TABS EVERY 12 HOURS FOR PAIN          BP 191/92  Pulse 66  Temp(Src) 98.2 F (36.8 C) (Oral)  Resp 17  SpO2 97% Physical Exam  Nursing note and vitals reviewed. Constitutional: He is oriented to person, place, and time. He appears well-developed and well-nourished. No distress.  HENT:  Head: Normocephalic.  Nose: Mucosal edema (on the left) and rhinorrhea (purulent on the left) present.  No foreign bodies. Right sinus exhibits no maxillary sinus tenderness and no frontal sinus tenderness. Left sinus exhibits maxillary sinus tenderness. Left sinus exhibits no frontal sinus tenderness.  Pulmonary/Chest: Effort normal. No respiratory distress.  Lymphadenopathy:       Head (right side): No submandibular and no tonsillar adenopathy present.       Head (left side): No submandibular and no tonsillar adenopathy present.    He has no cervical adenopathy.  Neurological: He is alert and oriented to person, place, and time. Coordination normal.  Skin: Skin is warm and dry. No rash noted. He is not diaphoretic.  Psychiatric: He has a normal mood and affect. Judgment normal.    ED Course  Procedures (including critical care time) Labs Review Labs Reviewed - No data to display Imaging Review No results found.  MDM   1. Sinusitis, acute    This presentation is consistent with acute sinusitis. Given the duration of his symptoms, we will use antibiotic therapy. Also we'll treat for the  swelling in the sinuses. Advised to discontinue any decongestant use because of his history of hypertension and coronary artery disease. Avoid antihistamines as well. Advised to use Mucinex in addition to prescriptions. Followup if worsening or not improving.   Meds ordered this encounter  Medications  . fluticasone (FLONASE) 50 MCG/ACT nasal spray  Sig: Place 2 sprays into the nose daily.    Dispense:  1 g    Refill:  2    Order Specific Question:  Supervising Provider    Answer:  Lorenz Coaster, DAVID C [id number]  . amoxicillin-clavulanate (AUGMENTIN) 875-125 MG per tablet    Sig: Take 1 tablet by mouth every 12 (twelve) hours.    Dispense:  14 tablet    Refill:  0    Order Specific Question:  Supervising Provider    Answer:  Lorenz Coaster, DAVID C [id number]  . methylPREDNISolone (MEDROL DOSEPAK) 4 MG tablet    Sig: follow package directions    Dispense:  21 tablet    Refill:  0    Order Specific Question:  Supervising Provider    Answer:  Lorenz Coaster, DAVID C [[id number]]       Graylon Good, PA-C 06/04/13 2051

## 2013-06-04 NOTE — ED Notes (Signed)
Pt reports 2 weeks of sinus congestion without fever.   He took some amoxicillin he had leftover for 3-4 days, last dose 1 week ago

## 2013-06-06 NOTE — ED Provider Notes (Signed)
Medical screening examination/treatment/procedure(s) were performed by a resident physician or non-physician practitioner and as the supervising physician I was immediately available for consultation/collaboration.  Allice Garro, MD    Marina Desire S Decorian Schuenemann, MD 06/06/13 0742 

## 2013-06-16 ENCOUNTER — Ambulatory Visit (INDEPENDENT_AMBULATORY_CARE_PROVIDER_SITE_OTHER): Payer: 59 | Admitting: Cardiology

## 2013-06-16 ENCOUNTER — Encounter: Payer: Self-pay | Admitting: Cardiology

## 2013-06-16 VITALS — BP 132/60 | HR 70 | Ht 70.0 in | Wt 221.8 lb

## 2013-06-16 DIAGNOSIS — E7800 Pure hypercholesterolemia, unspecified: Secondary | ICD-10-CM

## 2013-06-16 DIAGNOSIS — I10 Essential (primary) hypertension: Secondary | ICD-10-CM

## 2013-06-16 LAB — HEPATIC FUNCTION PANEL
ALT: 36 U/L (ref 0–53)
AST: 23 U/L (ref 0–37)
Albumin: 3.6 g/dL (ref 3.5–5.2)
Bilirubin, Direct: 0.1 mg/dL (ref 0.0–0.3)
Total Bilirubin: 0.5 mg/dL (ref 0.3–1.2)
Total Protein: 7.1 g/dL (ref 6.0–8.3)

## 2013-06-16 LAB — LIPID PANEL
Cholesterol: 173 mg/dL (ref 0–200)
HDL: 32.7 mg/dL — ABNORMAL LOW
Total CHOL/HDL Ratio: 5
Triglycerides: 418 mg/dL — ABNORMAL HIGH (ref 0.0–149.0)
VLDL: 83.6 mg/dL — ABNORMAL HIGH (ref 0.0–40.0)

## 2013-06-16 LAB — LDL CHOLESTEROL, DIRECT: Direct LDL: 91 mg/dL

## 2013-06-16 MED ORDER — AMLODIPINE BESYLATE 10 MG PO TABS: 10.0000 mg | ORAL_TABLET | Freq: Every day | ORAL | Status: DC

## 2013-06-16 NOTE — Assessment & Plan Note (Signed)
Continue aspirin and statin. 

## 2013-06-16 NOTE — Patient Instructions (Signed)
Your physician wants you to follow-up in: ONE YEAR WITH DR Shelda Pal will receive a reminder letter in the mail two months in advance. If you don't receive a letter, please call our office to schedule the follow-up appointment.  INCREASE AMLODIPINE TO 10 MG ONCE DAILY  Your physician recommends that you HAVE LAB WORK TODAY

## 2013-06-16 NOTE — Progress Notes (Signed)
HPI: FU CAD; s/p multiple prior PCIs, s/p subsequent CABG 06/2006 (LIMA to the LAD, sequential saphenous vein graft to the intermediate and obtuse marginal, and a saphenous vein graft to the PDA). Carotid Dopplers in April of 2005 showed normal arteries. His last echocardiogram in February 2008 showed preserved LV function. ABIs for leg pain in Jan 2012 were normal. ETT-Myoview 4/13: No ischemia, EF 53%, low risk. Patient seen in the office in August of 2014 by Tereso Newcomer with complaints of dizziness. Blood pressure elevated and Norvasc added. Since that time, the patient denies any dyspnea on exertion, orthopnea, PND, pedal edema, palpitations, syncope or chest pain.    Current Outpatient Prescriptions  Medication Sig Dispense Refill  . amLODipine (NORVASC) 5 MG tablet Take 1 tablet (5 mg total) by mouth daily.  30 tablet  11  . aspirin 81 MG tablet Take 81 mg by mouth daily.      Marland Kitchen atorvastatin (LIPITOR) 80 MG tablet TAKE ONE TABLET BY MOUTH ONCE DAILY  30 tablet  6  . cetirizine (ZYRTEC) 10 MG tablet Take 10 mg by mouth daily.      . diphenhydramine-acetaminophen (TYLENOL PM EXTRA STRENGTH) 25-500 MG TABS Take 2 tablets by mouth at bedtime as needed.      . fluticasone (FLONASE) 50 MCG/ACT nasal spray Place 2 sprays into the nose daily.  1 g  2  . metoprolol succinate (TOPROL-XL) 50 MG 24 hr tablet Take 1 tablet (50 mg total) by mouth daily. Take with or immediately following a meal.  30 tablet  11  . naproxen sodium (ANAPROX) 220 MG tablet Take 220 mg by mouth 2 (two) times daily with a meal. TAKE TWO TABS EVERY 12 HOURS FOR PAIN      . omeprazole (PRILOSEC) 20 MG capsule Take 20 mg by mouth daily.       No current facility-administered medications for this visit.     Past Medical History  Diagnosis Date  . COPD (chronic obstructive pulmonary disease)   . Myocardial infarction   . Hypertension   . Asthma     occ bronchitis  . GERD (gastroesophageal reflux disease)   .  Arthritis   . Seasonal allergies   . Coronary artery disease     a. s/p multiple prior PCIs; b. s/p CABG 06/2006 (L-LAD, S-Int and OM, S-PDA);  c. ETT-Myoview 4/13: low risk, no ischemia, EF 53%  . Hx of echocardiogram     Echo 10/2006: normal LVF  . Hyperlipidemia     Past Surgical History  Procedure Laterality Date  . Coronary artery bypass graft  2007  . Coronary stent placement      x5 prior to cabg  . Tonsillectomy    . Colonoscopy    . Cardiac catheterization    . Knee arthroscopy      rt knee  . Acne cyst removal    . Shoulder arthroscopy  12/14/2011    removed bone spurs and repositioned muscle.     History   Social History  . Marital Status: Married    Spouse Name: N/A    Number of Children: N/A  . Years of Education: N/A   Occupational History  . Not on file.   Social History Main Topics  . Smoking status: Current Every Day Smoker -- 1.00 packs/day    Types: Cigarettes  . Smokeless tobacco: Not on file  . Alcohol Use: Yes     Comment: occasional  . Drug Use:  No  . Sexual Activity: Not on file   Other Topics Concern  . 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     ROS: no fevers or chills, productive cough, hemoptysis, dysphasia, odynophagia, melena, hematochezia, dysuria, hematuria, rash, seizure activity, orthopnea, PND, pedal edema, claudication. Remaining systems are negative.  Physical Exam: Well-developed well-nourished in no acute distress.  Skin is warm and dry.  HEENT is normal.  Neck is supple.  Chest is clear to auscultation with normal expansion.  Cardiovascular exam is regular rate and rhythm.  Abdominal exam nontender or distended. No masses palpated. Extremities show no edema. neuro grossly intact  ECG 04/25/2013-sinus rhythm with nonspecific T-wave changes.

## 2013-06-16 NOTE — Assessment & Plan Note (Signed)
Patient counseled on discontinuing. 

## 2013-06-16 NOTE — Assessment & Plan Note (Signed)
Blood pressure improved but he states his systolic runs 145 at home. Increase Norvasc to 10 mg daily.

## 2013-06-16 NOTE — Assessment & Plan Note (Signed)
Continue statin. Check lipids and liver. 

## 2013-06-19 ENCOUNTER — Encounter: Payer: Self-pay | Admitting: *Deleted

## 2013-07-13 ENCOUNTER — Other Ambulatory Visit: Payer: Self-pay

## 2013-12-29 ENCOUNTER — Other Ambulatory Visit: Payer: Self-pay | Admitting: Cardiology

## 2014-02-26 ENCOUNTER — Other Ambulatory Visit: Payer: Self-pay | Admitting: Cardiology

## 2014-06-06 ENCOUNTER — Other Ambulatory Visit: Payer: Self-pay

## 2014-06-06 DIAGNOSIS — I10 Essential (primary) hypertension: Secondary | ICD-10-CM

## 2014-06-06 MED ORDER — METOPROLOL SUCCINATE ER 50 MG PO TB24: 50.0000 mg | ORAL_TABLET | Freq: Every day | ORAL | Status: DC

## 2014-08-09 ENCOUNTER — Other Ambulatory Visit: Payer: Self-pay | Admitting: Cardiology

## 2014-08-10 ENCOUNTER — Other Ambulatory Visit: Payer: Self-pay

## 2014-08-10 DIAGNOSIS — I10 Essential (primary) hypertension: Secondary | ICD-10-CM

## 2014-08-10 MED ORDER — AMLODIPINE BESYLATE 10 MG PO TABS: 10.0000 mg | ORAL_TABLET | Freq: Every day | ORAL | Status: DC

## 2014-08-27 ENCOUNTER — Ambulatory Visit (INDEPENDENT_AMBULATORY_CARE_PROVIDER_SITE_OTHER): Payer: 59 | Admitting: Physician Assistant

## 2014-08-27 ENCOUNTER — Ambulatory Visit (INDEPENDENT_AMBULATORY_CARE_PROVIDER_SITE_OTHER): Payer: 59

## 2014-08-27 VITALS — BP 126/64 | HR 70 | Temp 97.9°F | Resp 16 | Ht 68.75 in | Wt 221.8 lb

## 2014-08-27 DIAGNOSIS — R0602 Shortness of breath: Secondary | ICD-10-CM

## 2014-08-27 DIAGNOSIS — R05 Cough: Secondary | ICD-10-CM

## 2014-08-27 DIAGNOSIS — J441 Chronic obstructive pulmonary disease with (acute) exacerbation: Secondary | ICD-10-CM

## 2014-08-27 DIAGNOSIS — R059 Cough, unspecified: Secondary | ICD-10-CM

## 2014-08-27 DIAGNOSIS — J069 Acute upper respiratory infection, unspecified: Secondary | ICD-10-CM

## 2014-08-27 MED ORDER — BENZONATATE 100 MG PO CAPS: 100.0000 mg | ORAL_CAPSULE | Freq: Three times a day (TID) | ORAL | Status: DC | PRN

## 2014-08-27 MED ORDER — LEVALBUTEROL HCL 0.63 MG/3ML IN NEBU
0.6300 mg | INHALATION_SOLUTION | Freq: Once | RESPIRATORY_TRACT | Status: AC
  Administered 2014-08-27: 0.63 mg via RESPIRATORY_TRACT

## 2014-08-27 MED ORDER — ALBUTEROL SULFATE HFA 108 (90 BASE) MCG/ACT IN AERS: 2.0000 | INHALATION_SPRAY | RESPIRATORY_TRACT | Status: DC | PRN

## 2014-08-27 MED ORDER — PROMETHAZINE-DM 6.25-15 MG/5ML PO SYRP: 5.0000 mL | ORAL_SOLUTION | Freq: Every evening | ORAL | Status: DC | PRN

## 2014-08-27 MED ORDER — AZITHROMYCIN 250 MG PO TABS: ORAL_TABLET | ORAL | Status: AC

## 2014-08-27 MED ORDER — PREDNISONE 20 MG PO TABS: ORAL_TABLET | ORAL | Status: AC

## 2014-08-27 NOTE — Patient Instructions (Signed)

## 2014-08-27 NOTE — Progress Notes (Signed)
MRN: [id number] DOB: 10-13-1950  Subjective:   Chief Complaint  Patient presents with  . Cough        [name] is a 63 y.o. male presenting for 1-2 weeks of "bronchiole tube is so irritated".  He states that he has chest tightness, and deep inspiration sends him into a coughing fit.  Nasal congestion with yellow sputum.  He also had some body aches and fatigue, but thought this was due to a lack of sleep.  He is not coughing up sputum at this time.  He has used dayquil to assist with sinus relief which has helped.  He also used his wife's albuterol which appeared to help with his breathing, yet he complains he has no shortness of breath.  He denies dizziness, lightheadedness, or chest pains.  He smokes 1.5 packs per day.  Longest stent of cessation was 6 months, but he has tried a host of assistant devices such as patches, chantix ("made me want to kill somebody"), faux cigarettes.  He drinks very little water.  No known sick contacts.  He also states that on a prior visit, he was told that he has copd, but he was told "that that should go away".  He denies any chest pains, palpitaitons, or leg swelling.  [name] has a current medication list which includes the following prescription(s): amlodipine, aspirin, atorvastatin, cetirizine, diphenhydramine-acetaminophen, fluticasone, metoprolol succinate, pseudoephedrine-apap-dm, naproxen sodium, and omeprazole. He is allergic to codeine.  [name]  has a past medical history of COPD (chronic obstructive pulmonary disease); Myocardial infarction; Hypertension; Asthma; GERD (gastroesophageal reflux disease); Arthritis; Seasonal allergies; Coronary artery disease; echocardiogram; and Hyperlipidemia. Also  has past surgical history that includes Coronary artery bypass graft (2007); Coronary stent placement; Tonsillectomy; Colonoscopy; Cardiac catheterization; Knee arthroscopy; Acne cyst removal; and Shoulder arthroscopy (12/14/2011).  ROS As in  subjective.  Objective:   Vitals: BP 126/64 mmHg  Pulse 70  Temp(Src) 97.9 F (36.6 C) (Oral)  Resp 16  Ht 5' 8.75" (1.746 m)  Wt 221 lb 12.8 oz (100.608 kg)  BMI 33.00 kg/m2  SpO2 96%  Physical Exam  Constitutional: He is well-developed, well-nourished, and in no distress. No distress.  HENT:  Head: Normocephalic and atraumatic.  Eyes: Pupils are equal, round, and reactive to light.  Neck: Normal range of motion. Neck supple.  Cardiovascular: Normal rate and regular rhythm.  Exam reveals no gallop, no distant heart sounds and no friction rub.   No murmur heard. Pulses:      Radial pulses are 2+ on the right side, and 2+ on the left side.  Pulmonary/Chest: Effort normal. He has rhonchi (Expiratory).  Skin: No cyanosis. Nails show no clubbing.   Predicted pf: 579  Pre-nebulizing treatment: 250 Post-nebulizing treatment: 330  (57%) Rhonchi still apparent, but circulating breath sounds are much more normal throughout the lungs.    [facility] reading (PRIMARY) by  Dr. Conley Rolls. 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  No effusions.    Assessment and Plan :  63 year old male is here today for sob and cough for 1-2 weeks.   Spent time counseling patient of smoking cessation, hydration, and his copd.  Advised to have pcp established so we can accurately diagnose his copd while he is not having an exacerbation.   Antibiotic, prednisone taper, and albuterol prescribed at visit.  Advised to rtc in 1 month to establish care, and   Cough - Plan: DG Chest 2 View, levalbuterol (XOPENEX) nebulizer solution 0.63  mg, promethazine-dextromethorphan (PROMETHAZINE-DM) 6.25-15 MG/5ML syrup, benzonatate (TESSALON) 100 MG capsule  SOB (shortness of breath) - Plan: DG Chest 2 View, levalbuterol (XOPENEX) nebulizer solution 0.63 mg, albuterol (PROVENTIL HFA;VENTOLIN HFA) 108 (90 BASE) MCG/ACT inhaler, predniSONE (DELTASONE) 20 MG tablet  Upper respiratory infection - Plan: azithromycin  (ZITHROMAX) 250 MG tablet  COPD exacerbation - Plan: albuterol (PROVENTIL HFA;VENTOLIN HFA) 108 (90 BASE) MCG/ACT inhaler, predniSONE (DELTASONE) 20 MG tablet  Reviewed with Dr. Darrell JewelLe  Stephanie English, PA-C [facility] 12/23/201510:15 AM

## 2014-09-01 ENCOUNTER — Telehealth: Payer: 59 | Admitting: Nurse Practitioner

## 2014-09-01 DIAGNOSIS — R05 Cough: Secondary | ICD-10-CM

## 2014-09-01 DIAGNOSIS — R059 Cough, unspecified: Secondary | ICD-10-CM

## 2014-09-01 DIAGNOSIS — J209 Acute bronchitis, unspecified: Secondary | ICD-10-CM

## 2014-09-01 MED ORDER — PROMETHAZINE-DM 6.25-15 MG/5ML PO SYRP: 5.0000 mL | ORAL_SOLUTION | Freq: Every evening | ORAL | Status: AC | PRN

## 2014-09-01 NOTE — Progress Notes (Signed)
We are sorry that you are not feeling well.  Here is how we plan to help!  Based on what you have shared with me it looks like you have upper respiratory tract inflammation that has resulted in a signification cough.  Inflammation and infection in the upper respiratory tract is commonly called bronchitis and has four common causes:  Allergies, Viral Infections, Acid Reflux and Bacterial Infections.  Allergies, viruses and acid reflux are treated by controlling symptoms or eliminating the cause. An example might be a cough caused by taking certain blood pressure medications. You stop the cough by changing the medication. Another example might be a cough caused by acid reflux. Controlling the reflux helps control the cough.  Based on your presentation I believe you most likely have A cough due to bacteria.  When patients have a fever and a productive cough with a change in color or increased sputum production, we are concerned about bacterial bronchitis.  If left untreated it can progress to pneumonia.  If your symptoms do not improve with your treatment plan it is important that you contact your provider.  Continue current meds previously rx. I sent in rx for more promathazine DM- I HOPE IT HELPS!      HOME CARE . Only take medications as instructed by your medical team. . Complete the entire course of an antibiotic. . Drink plenty of fluids and get plenty of rest. . Avoid close contacts especially the very young and the elderly . Cover your mouth if you cough or cough into your sleeve. . Always remember to wash your hands . A steam or ultrasonic humidifier can help congestion.    GET HELP RIGHT AWAY IF: . You develop worsening fever. . You become short of breath . You cough up blood. . Your symptoms persist after you have completed your treatment plan MAKE SURE YOU   Understand these instructions.  Will watch your condition.  Will get help right away if you are not doing well or get  worse.  Your e-visit answers were reviewed by a board certified advanced clinical practitioner to complete your personal care plan.  Depending on the condition, your plan could have included both over the counter or prescription medications.  If there is a problem please reply  once you have received a response from your provider.  Your safety is important to [place].  If you have drug allergies check your prescription carefully.    You can use MyChart to ask questions about today's visit, request a non-urgent call back, or ask for a work or school excuse.  You will get an e-mail in the next two days asking about your experience.  I hope that your e-visit has been valuable and will speed your recovery. Thank you for using e-visits.

## 2014-09-02 ENCOUNTER — Ambulatory Visit (INDEPENDENT_AMBULATORY_CARE_PROVIDER_SITE_OTHER): Payer: 59 | Admitting: Family Medicine

## 2014-09-02 ENCOUNTER — Telehealth: Payer: Self-pay

## 2014-09-02 VITALS — BP 140/80 | HR 54 | Temp 97.3°F | Resp 16 | Ht 68.75 in | Wt 221.0 lb

## 2014-09-02 DIAGNOSIS — J209 Acute bronchitis, unspecified: Secondary | ICD-10-CM

## 2014-09-02 MED ORDER — HYDROCOD POLST-CHLORPHEN POLST 10-8 MG/5ML PO LQCR: 5.0000 mL | Freq: Two times a day (BID) | ORAL | Status: DC | PRN

## 2014-09-02 MED ORDER — LEVOFLOXACIN 500 MG PO TABS: 500.0000 mg | ORAL_TABLET | Freq: Every day | ORAL | Status: DC

## 2014-09-02 NOTE — Telephone Encounter (Signed)
Spoke with patient. Actual message was that he did not realize that the cough medication prescription was in the papers he received here. He received the one sent electronically as well.

## 2014-09-02 NOTE — Progress Notes (Signed)
° °  Subjective:    Patient ID: [name], male    DOB: 02/13/1951, 63 y.o.   MRN: [id number] This chart was scribed for Elvina Sidle, MD by Littie Deeds, Medical Scribe. This patient was seen in Room 1 and the patient's care was started at 3:25 PM.   HPI HPI Comments: [name] is a 63 y.o. male who presents to the [facility] complaining of gradual onset URI symptoms that began . Patient reports having associated occasionally productive cough. He was seen here 6 days ago and was diagnosed with bronchitis and given medications, including Z-pack. He also had XR done and was found to be negative for PNA. Patient has run out of the cough syrup, which helped him sleep. He had difficulty sleeping afterwards. Patient also tried Mucinex and other OTC but without relief. He still has two days of prednisone left. Patient denies fever.  Patient works at [employer] part-time; he is semi-retired.  Review of Systems  Constitutional: Negative for fever.  Respiratory: Positive for cough.   Psychiatric/Behavioral: Positive for sleep disturbance.       Objective:   Physical Exam CONSTITUTIONAL: Well developed/well nourished HEAD: Normocephalic/atraumatic EYES: EOM/PERRL ENMT: Mucous membranes moist, edentulous NECK: supple no meningeal signs SPINE: entire spine nontender CV: S1/S2 noted, no murmurs/rubs/gallops noted LUNGS: expiratory wheezes bilateraly ABDOMEN: soft, nontender, no rebound or guarding GU: no cva tenderness NEURO: Pt is awake/alert, moves all extremitiesx4 EXTREMITIES: pulses normal, full ROM SKIN: warm, color normal PSYCH: no abnormalities of mood noted      Assessment & Plan:   This chart was scribed in my presence and reviewed by me personally.    ICD-9-CM ICD-10-CM   1. Acute bronchitis, unspecified organism 466.0 J20.9 levofloxacin (LEVAQUIN) 500 MG tablet     chlorpheniramine-HYDROcodone (TUSSIONEX PENNKINETIC ER) 10-8 MG/5ML LQCR      Signed, Elvina Sidle, MD

## 2014-09-02 NOTE — Patient Instructions (Signed)

## 2014-09-02 NOTE — Telephone Encounter (Signed)
Patient's med was sent to [facility] however they are closed today. Please send to [facility] on [address]  [phone]

## 2014-09-13 ENCOUNTER — Other Ambulatory Visit: Payer: Self-pay | Admitting: Cardiology

## 2014-09-26 ENCOUNTER — Encounter: Payer: Self-pay | Admitting: Cardiology

## 2014-09-26 ENCOUNTER — Ambulatory Visit (INDEPENDENT_AMBULATORY_CARE_PROVIDER_SITE_OTHER): Payer: 59 | Admitting: Cardiology

## 2014-09-26 DIAGNOSIS — I2510 Atherosclerotic heart disease of native coronary artery without angina pectoris: Secondary | ICD-10-CM

## 2014-09-26 DIAGNOSIS — I25810 Atherosclerosis of coronary artery bypass graft(s) without angina pectoris: Secondary | ICD-10-CM

## 2014-09-26 MED ORDER — LISINOPRIL-HYDROCHLOROTHIAZIDE 10-12.5 MG PO TABS: 1.0000 | ORAL_TABLET | Freq: Every day | ORAL | Status: DC

## 2014-09-26 NOTE — Progress Notes (Signed)
HPI: FU CAD; s/p multiple prior PCIs, s/p subsequent CABG 06/2006 (LIMA to the LAD, sequential saphenous vein graft to the intermediate and obtuse marginal, and a saphenous vein graft to the PDA). Carotid Dopplers in April of 2005 showed normal arteries. His last echocardiogram in February 2008 showed preserved LV function. ABIs for leg pain in Jan 2012 were normal. ETT-Myoview 4/13: No ischemia, EF 53%, low risk. Since last seen, the patient denies any dyspnea on exertion, orthopnea, PND, pedal edema, palpitations, syncope or chest pain.  Current Outpatient Prescriptions  Medication Sig Dispense Refill  . albuterol (PROVENTIL HFA;VENTOLIN HFA) 108 (90 BASE) MCG/ACT inhaler Inhale 2 puffs into the lungs every 4 (four) hours as needed for wheezing or shortness of breath (cough, shortness of breath or wheezing.). 1 Inhaler 4  . amLODipine (NORVASC) 10 MG tablet Take 1 tablet (10 mg total) by mouth daily. 30 tablet 6  . aspirin 81 MG tablet Take 81 mg by mouth daily.    Marland Kitchen atorvastatin (LIPITOR) 80 MG tablet TAKE 1 TABLET BY MOUTH ONCE DAILY 30 tablet 5  . benzonatate (TESSALON) 100 MG capsule Take 1-2 capsules (100-200 mg total) by mouth 3 (three) times daily as needed for cough. 40 capsule 0  . cetirizine (ZYRTEC) 10 MG tablet Take 10 mg by mouth daily.    . diphenhydramine-acetaminophen (TYLENOL PM EXTRA STRENGTH) 25-500 MG TABS Take 2 tablets by mouth at bedtime as needed.    . metoprolol succinate (TOPROL-XL) 50 MG 24 hr tablet TAKE 1 TABLET BY MOUTH ONCE DAILY WITH OR IMMEDIATELY FOLLOWING A MEAL (MAKE APPT FOR MORE REFILLS) 30 tablet 0  . naproxen sodium (ANAPROX) 220 MG tablet Take 220 mg by mouth 2 (two) times daily with a meal. TAKE TWO TABS EVERY 12 HOURS FOR PAIN    . omeprazole (PRILOSEC) 20 MG capsule Take 20 mg by mouth daily.     No current facility-administered medications for this visit.     Past Medical History  Diagnosis Date  . COPD (chronic obstructive pulmonary  disease)   . Myocardial infarction   . Hypertension   . Asthma     occ bronchitis  . GERD (gastroesophageal reflux disease)   . Arthritis   . Seasonal allergies   . Coronary artery disease     a. s/p multiple prior PCIs; b. s/p CABG 06/2006 (L-LAD, S-Int and OM, S-PDA);  c. ETT-Myoview 4/13: low risk, no ischemia, EF 53%  . Hx of echocardiogram     Echo 10/2006: normal LVF  . Hyperlipidemia     Past Surgical History  Procedure Laterality Date  . Coronary artery bypass graft  2007  . Coronary stent placement      x5 prior to cabg  . Tonsillectomy    . Colonoscopy    . Cardiac catheterization    . Knee arthroscopy      rt knee  . Acne cyst removal    . Shoulder arthroscopy  12/14/2011    removed bone spurs and repositioned muscle.     History   Social History  . Marital Status: Married    Spouse Name: N/A    Number of Children: N/A  . Years of Education: N/A   Occupational History  . Not on file.   Social History Main Topics  . Smoking status: Current Every Day Smoker -- 1.00 packs/day    Types: Cigarettes  . Smokeless tobacco: Not on file  . Alcohol Use: Yes  Comment: occasional  . Drug Use: No  . Sexual Activity: Not on file   Other Topics Concern  . 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     ROS: no fevers or chills, productive cough, hemoptysis, dysphasia, odynophagia, melena, hematochezia, dysuria, hematuria, rash, seizure activity, orthopnea, PND, pedal edema, claudication. Remaining systems are negative.  Physical Exam: Well-developed well-nourished in no acute distress.  Skin is warm and dry.  HEENT is normal.  Neck is supple.  Chest is clear to auscultation with normal expansion.  Cardiovascular exam is regular rate and rhythm.  Abdominal exam nontender or distended. No masses palpated. Extremities show no edema. neuro grossly intact  ECG sinus  rhythm at a rate of 61. Cannot rule out prior inferior infarct. Nonspecific ST changes.

## 2014-09-26 NOTE — Assessment & Plan Note (Signed)
Blood pressure elevated. Add lisinopril HCT 10/12.5 mg daily. Check potassium and renal function in 1 week.

## 2014-09-26 NOTE — Assessment & Plan Note (Signed)
Continue statin. Check lipids and liver. 

## 2014-09-26 NOTE — Assessment & Plan Note (Signed)
Patient counseled on discontinuing. 

## 2014-09-26 NOTE — Assessment & Plan Note (Signed)
Continue aspirin and statin. 

## 2014-09-26 NOTE — Patient Instructions (Signed)
Your physician wants you to follow-up in: ONE YEAR WITH DR Shelda Pal will receive a reminder letter in the mail two months in advance. If you don't receive a letter, please call our office to schedule the follow-up appointment.   START LISINOPRIL/HCTZ 10-12.5 MG ONCE DAILY  Your physician recommends that you return for lab work in: ONE WEEK- DO NOT EAT PRIOR TO LAB WORK

## 2014-10-04 LAB — HEPATIC FUNCTION PANEL
ALBUMIN: 4 g/dL (ref 3.5–5.2)
ALK PHOS: 102 U/L (ref 39–117)
ALT: 27 U/L (ref 0–53)
AST: 20 U/L (ref 0–37)
BILIRUBIN INDIRECT: 0.4 mg/dL (ref 0.2–1.2)
Bilirubin, Direct: 0.1 mg/dL (ref 0.0–0.3)
Total Bilirubin: 0.5 mg/dL (ref 0.2–1.2)
Total Protein: 6.6 g/dL (ref 6.0–8.3)

## 2014-10-04 LAB — LIPID PANEL
Cholesterol: 173 mg/dL (ref 0–200)
HDL: 37 mg/dL — ABNORMAL LOW
LDL Cholesterol: 99 mg/dL (ref 0–99)
Total CHOL/HDL Ratio: 4.7 ratio
Triglycerides: 187 mg/dL — ABNORMAL HIGH
VLDL: 37 mg/dL (ref 0–40)

## 2014-10-04 LAB — BASIC METABOLIC PANEL WITHOUT GFR
BUN: 15 mg/dL (ref 6–23)
CO2: 26 meq/L (ref 19–32)
Calcium: 9 mg/dL (ref 8.4–10.5)
Chloride: 104 meq/L (ref 96–112)
Creat: 0.86 mg/dL (ref 0.50–1.35)
GFR, Est African American: >89 mL/min
GFR, Est Non African American: >89 mL/min
Glucose, Bld: 118 mg/dL — ABNORMAL HIGH (ref 70–99)
Potassium: 4.1 meq/L (ref 3.5–5.3)
Sodium: 139 meq/L (ref 135–145)

## 2014-10-05 ENCOUNTER — Ambulatory Visit (INDEPENDENT_AMBULATORY_CARE_PROVIDER_SITE_OTHER): Payer: 59 | Admitting: Family Medicine

## 2014-10-05 ENCOUNTER — Encounter: Payer: Self-pay | Admitting: Family Medicine

## 2014-10-05 VITALS — BP 126/64 | HR 65 | Temp 98.4°F | Resp 16 | Ht 69.0 in | Wt 221.8 lb

## 2014-10-05 DIAGNOSIS — E785 Hyperlipidemia, unspecified: Secondary | ICD-10-CM

## 2014-10-05 DIAGNOSIS — K219 Gastro-esophageal reflux disease without esophagitis: Secondary | ICD-10-CM

## 2014-10-05 DIAGNOSIS — R7302 Impaired glucose tolerance (oral): Secondary | ICD-10-CM

## 2014-10-05 DIAGNOSIS — G4700 Insomnia, unspecified: Secondary | ICD-10-CM

## 2014-10-05 DIAGNOSIS — I2510 Atherosclerotic heart disease of native coronary artery without angina pectoris: Secondary | ICD-10-CM

## 2014-10-05 DIAGNOSIS — I10 Essential (primary) hypertension: Secondary | ICD-10-CM

## 2014-10-05 LAB — POCT GLYCOSYLATED HEMOGLOBIN (HGB A1C): HEMOGLOBIN A1C: 6.1

## 2014-10-05 MED ORDER — TRAZODONE HCL 50 MG PO TABS: 50.0000 mg | ORAL_TABLET | Freq: Every day | ORAL | Status: DC

## 2014-10-05 MED ORDER — OMEPRAZOLE 20 MG PO CPDR: 20.0000 mg | DELAYED_RELEASE_CAPSULE | Freq: Every day | ORAL | Status: DC

## 2014-10-05 NOTE — Progress Notes (Signed)
Subjective:    Patient ID: [name], male    DOB: 02-28-1951, 64 y.o.   MRN: [id number]  HPI  This is a 64 year old male with PMH CAD, HLD, HTN, GERD, tobacco abuse who is presenting to establish care. Reports he has never had a PCP before and it has been years since he has had a physical.  Insomnia: been taking tylenol PM (2 tabs) nightly for at least 10 years. Wakes up groggy some days. If he doesn't take it he states he will only get 1 hour of sleep. Has a hard time "turning brain off" to fall asleep. He is wondering if there is something safer he should take.  HTN/CAD/HLD: Had a quadruple CABG 10 years ago. Had 5-6 MIs prior to CABG, non since. Sees cardiologist once a year, Dr. [name]. Scheduled to take stress test next year. Dr. [name] manages his heart medications. He checks BP once a day, always <140/90.  No SOB or CP currently. Walks 4+ miles a day at work - works at [employer]. No symptoms with walking at work. Sleeps on 1 pillow. Doesn't snore.  GERD: takes prilosec 20 mg daily. If he doesn't take his med for 3-4 days he will start to get heartburn.   Tobacco abuse/COPD: gets bronchitis once a year. States every time he catches a head cold it moves to his chest. Only uses albuterol when he gets bronchitis. Smokes 1.5 ppd. Tried patches, chantix, wellbutrin, e-cigs. Patches are the only things that have helped. He has quit a couple times with the patches for 6 month stretches but ends up starting again. He states when he quits he is miserable - he thoughts are consumed with smoking. States he smokes because he likes it, not so much for stress. States with chantix "he wanted to kill people". Doesn't cough at baseline. Has been told he has mild emphysema.  He reports some foot numbness when he walks for extended periods of time that resolves with rest. He reports he used to have posterior leg pain with walking. This has improved since starting his job at [employer]. He reports he  has had an ABI before and normal.  Review of Systems  Constitutional: Negative for fever and chills.  HENT: Negative for congestion.   Eyes: Negative for visual disturbance.  Respiratory: Negative for cough, shortness of breath and wheezing.   Cardiovascular: Negative for chest pain, palpitations and leg swelling.  Gastrointestinal: Negative for nausea, vomiting, abdominal pain, diarrhea and constipation.  Genitourinary: Negative for difficulty urinating.  Skin: Negative for rash.  Neurological: Negative for numbness.  Hematological: Negative for adenopathy.  Psychiatric/Behavioral: Positive for sleep disturbance.    Patient Active Problem List   Diagnosis Date Noted  . CLAUDICATION 09/04/2010  . TOBACCO ABUSE 08/15/2009  . HYPERLIPIDEMIA 08/12/2009  . Essential hypertension 08/12/2009  . MYOCARDIAL INFARCTION 08/12/2009  . Coronary atherosclerosis 08/12/2009  . ISCHEMIC CARDIOMYOPATHY 08/12/2009  . 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 SYNCOPE 08/12/2009  . INSOMNIA 08/12/2009   Prior to Admission medications   Medication Sig Start Date End Date Taking? Authorizing Provider  albuterol (PROVENTIL HFA;VENTOLIN HFA) 108 (90 BASE) MCG/ACT inhaler Inhale 2 puffs into the lungs every 4 (four) hours as needed for wheezing or shortness of breath (cough, shortness of breath or wheezing.). 08/27/14  Yes Stephanie D English, PA  amLODipine (NORVASC) 10 MG tablet Take 1 tablet (10 mg total) by mouth daily. 08/10/14  Yes Lewayne Bunting, MD  aspirin 81 MG  tablet Take 81 mg by mouth daily.   Yes Historical Provider, MD  atorvastatin (LIPITOR) 80 MG tablet TAKE 1 TABLET BY MOUTH ONCE DAILY 02/26/14  Yes Lewayne Bunting, MD  cetirizine (ZYRTEC) 10 MG tablet Take 10 mg by mouth daily.   Yes Historical Provider, MD  diphenhydramine-acetaminophen (TYLENOL PM EXTRA STRENGTH) 25-500 MG TABS Take 2 tablets by mouth at bedtime as needed.   Yes Historical Provider, MD  lisinopril-hydrochlorothiazide  (PRINZIDE,ZESTORETIC) 10-12.5 MG per tablet Take 1 tablet by mouth daily. 09/26/14  Yes Lewayne Bunting, MD  metoprolol succinate (TOPROL-XL) 50 MG 24 hr tablet TAKE 1 TABLET BY MOUTH ONCE DAILY WITH OR IMMEDIATELY FOLLOWING A MEAL (MAKE APPT FOR MORE REFILLS) 09/14/14  Yes Lewayne Bunting, MD  naproxen sodium (ANAPROX) 220 MG tablet Take 220 mg by mouth 2 (two) times daily with a meal. TAKE TWO TABS EVERY 12 HOURS FOR PAIN   Yes Historical Provider, MD  omeprazole (PRILOSEC) 20 MG capsule Take 20 mg by mouth daily.   Yes Historical Provider, MD   Allergies  Allergen Reactions  . Codeine Nausea And Vomiting   Patient's social and family history were reviewed.     Objective:   Physical Exam  Constitutional: He is oriented to person, place, and time. He appears well-developed and well-nourished. No distress.  HENT:  Head: Normocephalic and atraumatic.  Right Ear: Hearing normal.  Left Ear: Hearing normal.  Nose: Nose normal.  Eyes: Conjunctivae and lids are normal. Right eye exhibits no discharge. Left eye exhibits no discharge. No scleral icterus.  Neck: Trachea normal. Carotid bruit is not present.  Cardiovascular: Normal rate, regular rhythm, normal heart sounds, intact distal pulses and normal pulses.   No murmur heard. Pulmonary/Chest: Effort normal and breath sounds normal. No respiratory distress. He has no wheezes. He has no rhonchi. He has no rales.  Abdominal: Soft. Normal appearance. He exhibits no abdominal bruit. There is no tenderness.  Musculoskeletal: Normal range of motion.  Neurological: He is alert and oriented to person, place, and time.  Skin: Skin is warm, dry and intact. No lesion and no rash noted.  1+ bilateral peripheral edema.  Psychiatric: He has a normal mood and affect. His speech is normal and behavior is normal. Thought content normal.   BP 126/64 mmHg  Pulse 65  Temp(Src) 98.4 F (36.9 C) (Oral)  Resp 16  Ht  (1.753 m)  Wt 221 lb 12.8 oz  (100.608 kg)  BMI 32.74 kg/m2  SpO2 97%  Results for orders placed or performed in visit on 10/05/14  POCT glycosylated hemoglobin (Hb A1C)  Result Value Ref Range   Hemoglobin A1C 6.1       Assessment & Plan:  1. Glucose intolerance (impaired glucose tolerance) A1C 6.1 today - focus on diet/exercise modification. No medications to be added at this time. He will follow up with me in 3 months for a CPE. - POCT glycosylated hemoglobin (Hb A1C)  2. Insomnia Pt will try trazodone 50-150 mg QHS and see if this works for him. He should stop tylenol PM if able. - traZODone (DESYREL) 50 MG tablet; Take 1 tablet (50 mg total) by mouth at bedtime.  Dispense: 60 tablet; Refill: 3  3. Gastroesophageal reflux disease, esophagitis presence not specified Advised pt take prilosec every other day if possible. - omeprazole (PRILOSEC) 20 MG capsule; Take 1 capsule (20 mg total) by mouth daily.  Dispense: 30 capsule; Refill: 12  4. Essential hypertension 5. Hyperlipidemia 6.  CAD, multiple vessel Last lipid 2 days ago with good total and LDL. Triglycerides elevated to 187 - discussed exercise and weight loss for control. Last CMP 2 days ago  Follow up with cardiology.   Roswell Min[place]ke Brackett, MHS [facility]  10/05/2014

## 2014-10-05 NOTE — Patient Instructions (Signed)
Try taking 1 trazodone pill for sleep. If that doesn't work for you, try 2 pills. Make take up to 3 pills but don't take more than you need. Take prilosec every other day and see if that works for your heartburn. I will let you know the results of your blood tests today. Return to see me in 3 months for a physical. Scheduling will call to make this appointment for you.

## 2014-10-12 ENCOUNTER — Telehealth: Payer: Self-pay | Admitting: Family Medicine

## 2014-10-12 NOTE — Telephone Encounter (Signed)
Left message on machine with appt time and date

## 2014-10-12 NOTE — Telephone Encounter (Signed)
-----   Message from Dorna Leitz, [place] sent at 10/05/2014  1:55 PM EST ----- I will start having regular appointments in April. Please schedule this pt for a cpe with me in April or may.

## 2014-10-15 ENCOUNTER — Other Ambulatory Visit: Payer: Self-pay | Admitting: Cardiology

## 2014-10-16 NOTE — Progress Notes (Signed)
Reviewed documentation and agree w/ assessment and plan. Adekunle Rohrbach, MD MPH 

## 2014-11-15 ENCOUNTER — Other Ambulatory Visit: Payer: Self-pay | Admitting: Cardiology

## 2014-11-15 NOTE — Telephone Encounter (Signed)
Rx refill sent to patient pharmacy   

## 2014-12-11 ENCOUNTER — Encounter: Payer: Self-pay | Admitting: Physician Assistant

## 2014-12-14 ENCOUNTER — Ambulatory Visit (INDEPENDENT_AMBULATORY_CARE_PROVIDER_SITE_OTHER): Payer: 59 | Admitting: Family Medicine

## 2014-12-14 ENCOUNTER — Encounter: Payer: Self-pay | Admitting: Family Medicine

## 2014-12-14 VITALS — BP 138/60 | HR 55 | Temp 98.2°F | Resp 16 | Ht 69.0 in | Wt 217.4 lb

## 2014-12-14 DIAGNOSIS — G4700 Insomnia, unspecified: Secondary | ICD-10-CM | POA: Diagnosis not present

## 2014-12-14 DIAGNOSIS — Z720 Tobacco use: Secondary | ICD-10-CM

## 2014-12-14 DIAGNOSIS — Z125 Encounter for screening for malignant neoplasm of prostate: Secondary | ICD-10-CM | POA: Diagnosis not present

## 2014-12-14 DIAGNOSIS — I2510 Atherosclerotic heart disease of native coronary artery without angina pectoris: Secondary | ICD-10-CM | POA: Diagnosis not present

## 2014-12-14 DIAGNOSIS — R7303 Prediabetes: Secondary | ICD-10-CM

## 2014-12-14 DIAGNOSIS — I25810 Atherosclerosis of coronary artery bypass graft(s) without angina pectoris: Secondary | ICD-10-CM | POA: Diagnosis not present

## 2014-12-14 DIAGNOSIS — Z113 Encounter for screening for infections with a predominantly sexual mode of transmission: Secondary | ICD-10-CM | POA: Diagnosis not present

## 2014-12-14 DIAGNOSIS — Z119 Encounter for screening for infectious and parasitic diseases, unspecified: Secondary | ICD-10-CM | POA: Diagnosis not present

## 2014-12-14 DIAGNOSIS — Z0000 Encounter for general adult medical examination without abnormal findings: Secondary | ICD-10-CM | POA: Diagnosis not present

## 2014-12-14 DIAGNOSIS — R7309 Other abnormal glucose: Secondary | ICD-10-CM

## 2014-12-14 DIAGNOSIS — I10 Essential (primary) hypertension: Secondary | ICD-10-CM | POA: Diagnosis not present

## 2014-12-14 DIAGNOSIS — K219 Gastro-esophageal reflux disease without esophagitis: Secondary | ICD-10-CM | POA: Diagnosis not present

## 2014-12-14 DIAGNOSIS — Z23 Encounter for immunization: Secondary | ICD-10-CM

## 2014-12-14 LAB — POCT URINALYSIS DIPSTICK
Bilirubin, UA: NEGATIVE
Blood, UA: NEGATIVE
GLUCOSE UA: NEGATIVE
KETONES UA: NEGATIVE
Leukocytes, UA: NEGATIVE
NITRITE UA: NEGATIVE
PH UA: 5.5
Protein, UA: NEGATIVE
Spec Grav, UA: <=1.005
Urobilinogen, UA: 0.2

## 2014-12-14 NOTE — Progress Notes (Addendum)
Subjective:    Patient ID: [name], male    DOB: 12/01/1950, 64 y.o.   MRN: [id number] This chart was scribed for Dale Cheadle, MD by Zola Button, Medical Scribe. This patient was seen in Room 21 and the patient's care was started at 2:05 PM.   Chief Complaint  Patient presents with  . Annual Exam  . Medication Refill    amlodipine, LIPITOR, TOPROL-XL, and omeprazole - ALL for 70 DAYS    HPI  HPI Comments: [name] is a 64 y.o. male with a hx of pre-diabetes, HTN, HLD, CAD, emphysema, and claudication who presents to the [facility] for an annual exam and medication refills.  Was seen in my clinic 2 months ago to establish care, but was seen by Drenda Freeze, so I have not met him prior. He is here for a full physical along with review of his chronic medical problems. At his visit 2 months prior his HGB A1C was 6.1. He had been having some insomnia, using Tylenol PM to treat so pt was tried on trazodone instead. Then started on Prilosec 20 mg.   Followed by cardiologist Dr. Stanford Breed for HLD, CAD, and HTN - last seen 3 months ago. Had multiple stents followed by CABG in 2007. Still smoking.    Health Maintenance: He has not had a prior CPE in our system.     Patient did have a colonoscopy done in [place] a few years ago, which was negative.   He denies any prostate problems. He does report 1-2 episodes of nocturia each night. He does feel like he empties his bladder each time he urinates. No FMHx of prostate cancer per patient.   He is not taking any vitamins. He does take baby aspirin.   Diet: Patient notes that his wife has changed her diet to a low-carb diet, so his diet has changed some due to this. She had been on insulin, but had been able to get off of insulin within a few weeks due to the diet change. He notes that his appetite has decreased; he has not noticed any weight gain or weight loss.   Smoking/COPD: He states he smokes about 2 ppd. Patient  has tried quitting in the past; he did quit twice for 6 months, but was unable to stay off. The first time he quit he used the patch. He has not been able to quit using the patch the subsequent times. He has tried e-cigarettes, Zyban, and Chantix before - he has tried everything that is out there numerous times w/o success and so is frustrated and disheartened. His wife does not currently smoke, but she does have hx of smoking.   Patient was told he had some mild emphysema. He has an albuterol inhaler to use prn, but typically does not use it unless he has bronchitis. Denies any activity limitations or ShoB/DOE, no wheezing.  Foot Pain: Patient reports having occasional foot pain and foot numbness when walking. He believes this to be due to circulation problems. He states he has been walking more now and does not feel limited by this pain.   Hypertension: He checks his blood pressure at home every few days, but more often if he is not feeling well. He typically gets readings in the 426S (systolic). Patient did have some leg cramping for about 10 minutes due to lisinopril-HCTZ, so he had stopped taking it. Since then, his dose has been decreased so he has resumed.  He notes occasionally feeling as if his leg is starting to cramp when straightening out his legs in both of his legs.   Immunizations: Patient does not typically get the flu vaccine and has not had the flu in a long time. He states he is not UTD on tetanus or pneumonia and is reluctant to get most vaccines  Insomnia: He took the trazodone twice, which he states helped him feel tired, but does not calm down his mind. He has been taking Tylenol PM which does provide some relief. Patient feels like he does not need to try another sleep medication. He denies dry mouth, constipation and urinary problems.  He was doing Optician, dispensing for work, but now works at [employer] in [place] since this past October - likes it.  His wife works for [employer] and has  IDDM.  Past Medical History  Diagnosis Date  . COPD (chronic obstructive pulmonary disease)   . Myocardial infarction   . Hypertension   . Asthma     occ bronchitis  . GERD (gastroesophageal reflux disease)   . Arthritis   . Seasonal allergies   . Coronary artery disease     a. s/p multiple prior PCIs; b. s/p CABG 06/2006 (L-LAD, S-Int and OM, S-PDA);  c. ETT-Myoview 4/13: low risk, no ischemia, EF 53%  . Hx of echocardiogram     Echo 10/2006: normal LVF  . Hyperlipidemia    Past Surgical History  Procedure Laterality Date  . Coronary artery bypass graft  2007  . Coronary stent placement      x5 prior to cabg  . Tonsillectomy    . Colonoscopy    . Cardiac catheterization    . Knee arthroscopy      rt knee  . Acne cyst removal    . Shoulder arthroscopy  12/14/2011    removed bone spurs and repositioned muscle.    Family History  Problem Relation Age of Onset  . Anesthesia problems Neg Hx   . Coronary artery disease Father     brothers, sisters  . Heart disease Father   . Diabetes Mother   . Heart disease Sister   . Heart disease Sister   . Heart disease Brother     History   Social History  . Marital Status: Married    Spouse Name: N/A  . Number of Children: N/A  . Years of Education: N/A   Occupational History  . RETAIL    Social History Main Topics  . Smoking status: Current Every Day Smoker -- 1.00 packs/day    Types: Cigarettes  . Smokeless tobacco: Not on file  . Alcohol Use: 0.0 oz/week    0 Standard drinks or equivalent per week     Comment: occasional - BEER OR WINE  . Drug Use: No  . Sexual Activity: Not on file   Other Topics Concern  . 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      Review of Systems Complete ROS obtained per patient health survey.     Objective:  BP 138/60 mmHg  Pulse 55  Temp(Src) 98.2 F (36.8 C) (Oral)  Resp 16  Ht 5'  9" (1.753 m)  Wt 217 lb 6.4 oz (98.612 kg)  BMI 32.09 kg/m2  SpO2 97%  Physical Exam  Constitutional: He is oriented to person, place, and time. He appears well-developed and well-nourished. No  distress.  HENT:  Head: Normocephalic and atraumatic.  Right Ear: Tympanic membrane, external ear and ear canal normal.  Left Ear: Tympanic membrane, external ear and ear canal normal.  Nose: Mucosal edema and rhinorrhea present.  Mouth/Throat: Uvula is midline and mucous membranes are normal. Posterior oropharyngeal erythema present. No oropharyngeal exudate.  Nasal mucosa congested bilaterally.  Eyes: Conjunctivae are normal. Pupils are equal, round, and reactive to light. Right eye exhibits no discharge. Left eye exhibits no discharge. No scleral icterus.  Neck: Normal range of motion. Neck supple. No thyromegaly present.  Cardiovascular: Normal rate, regular rhythm, S1 normal, S2 normal, normal heart sounds and intact distal pulses.   No murmur heard. Pulmonary/Chest: Effort normal and breath sounds normal. No respiratory distress. He has no wheezes. He has no rales.  CTAB.  Abdominal: Soft. Bowel sounds are normal. He exhibits no distension and no mass. There is no tenderness. There is no rebound and no guarding.  Genitourinary: Rectum normal. Prostate is enlarged. Prostate is not tender.  Diffusely enlarged prostate.  Musculoskeletal: He exhibits no edema.  Lymphadenopathy:    He has no cervical adenopathy.  Neurological: He is alert and oriented to person, place, and time. He has normal reflexes. No cranial nerve deficit. He exhibits normal muscle tone.  Skin: Skin is warm and dry. No rash noted. He is not diaphoretic. No erythema.  Psychiatric: He has a normal mood and affect. His behavior is normal.  Vitals reviewed.  Results for orders placed or performed in visit on 12/14/14  POCT urinalysis dipstick  Result Value Ref Range   Color, UA yellow    Clarity, UA clear    Glucose, UA  neg    Bilirubin, UA neg    Ketones, UA neg    Spec Grav, UA <=1.005    Blood, UA neg    pH, UA 5.5    Protein, UA neg    Urobilinogen, UA 0.2    Nitrite, UA neg    Leukocytes, UA Negative         Assessment & Plan:  Essential hypertension - Plan: POCT urinalysis dipstick - under adequte control so cont current regimen as well as monitor outside office - push fluids, high K diet, prn magnesium supp for lower ext muscle spasms w/ hctz.  Annual physical exam - Plan: POCT urinalysis dipstick - needs shingles vaccine so give zostavax rx at f/u. Pt declines blood work today but agrees to do at next routine OV.  Will need one-time hiv and hep C screen along w/ PSA w/ next routine labs. Also rec pneumovax-23 but pt declines today since tdap was administered. Pt states normal colonosocpy sev yrs prev - Appears pt had a screening colonosocpy by Dr. Leone Payor at [place] at 01/26/2007.  Need for diphtheria-tetanus-pertussis (Tdap) vaccine, adult/adolescent - Plan: Tdap vaccine greater than or equal to 7yo IM  Gastroesophageal reflux disease, esophagitis presence not specified  CAD - followed by Dr. [name] - needs pneumococcal vaccination at next OV.  Tob abuse -  Pt frustrated and disappointed by many unsuccessful prior attempts - it does seem that pt has tried everything - even signed up for [place] classes.  Consider trying mutliple things simultaneously - nicotine patch, classes/therapy, zyban, etc.  Insomnia - doing well on diphenhydramine - ok to add in prn trazodone in addition - doxylamine did NOT work for pt.  Pre-diabetes - hgba1c 1/29 6.1 but pt has started on a low carb diet since so recheck at f/u.  I personally performed the services described in this documentation, which was scribed in my presence. The recorded information has been reviewed and considered, and addended by me as needed.  Dale Cheadle, MD MPH

## 2014-12-18 ENCOUNTER — Other Ambulatory Visit: Payer: Self-pay

## 2014-12-18 NOTE — Telephone Encounter (Signed)
Pharm sent req to [facility] metoprolol. Dr Clelia Croft, I see that pt was in to see you for CPE and wanted several RFs, but don't see that this has been rxd by [place] before and your notes aren't complete yet. Do you want to OK Rx? Pended as sent by pharm.

## 2014-12-19 DIAGNOSIS — R7303 Prediabetes: Secondary | ICD-10-CM | POA: Insufficient documentation

## 2014-12-19 DIAGNOSIS — K219 Gastro-esophageal reflux disease without esophagitis: Secondary | ICD-10-CM | POA: Insufficient documentation

## 2014-12-19 MED ORDER — LISINOPRIL-HYDROCHLOROTHIAZIDE 10-12.5 MG PO TABS: 1.0000 | ORAL_TABLET | Freq: Every day | ORAL | Status: DC

## 2014-12-19 MED ORDER — ATORVASTATIN CALCIUM 80 MG PO TABS: 80.0000 mg | ORAL_TABLET | Freq: Every day | ORAL | Status: DC

## 2014-12-19 MED ORDER — AMLODIPINE BESYLATE 10 MG PO TABS: 10.0000 mg | ORAL_TABLET | Freq: Every day | ORAL | Status: DC

## 2014-12-19 MED ORDER — METOPROLOL SUCCINATE ER 50 MG PO TB24: 50.0000 mg | ORAL_TABLET | Freq: Every day | ORAL | Status: DC

## 2014-12-19 MED ORDER — OMEPRAZOLE 20 MG PO CPDR: 20.0000 mg | DELAYED_RELEASE_CAPSULE | Freq: Every day | ORAL | Status: DC

## 2014-12-19 NOTE — Telephone Encounter (Signed)
Med refilled.

## 2015-01-02 ENCOUNTER — Encounter: Payer: Self-pay | Admitting: Family Medicine

## 2015-01-22 ENCOUNTER — Encounter: Payer: Self-pay | Admitting: Physician Assistant

## 2015-02-22 ENCOUNTER — Encounter: Payer: Self-pay | Admitting: Family Medicine

## 2015-02-22 ENCOUNTER — Ambulatory Visit (INDEPENDENT_AMBULATORY_CARE_PROVIDER_SITE_OTHER): Payer: 59 | Admitting: Family Medicine

## 2015-02-22 VITALS — BP 153/76 | HR 85 | Temp 98.0°F | Resp 16 | Ht 69.5 in | Wt 217.0 lb

## 2015-02-22 DIAGNOSIS — Z125 Encounter for screening for malignant neoplasm of prostate: Secondary | ICD-10-CM | POA: Diagnosis not present

## 2015-02-22 DIAGNOSIS — I10 Essential (primary) hypertension: Secondary | ICD-10-CM | POA: Diagnosis not present

## 2015-02-22 DIAGNOSIS — K219 Gastro-esophageal reflux disease without esophagitis: Secondary | ICD-10-CM

## 2015-02-22 DIAGNOSIS — F17200 Nicotine dependence, unspecified, uncomplicated: Secondary | ICD-10-CM

## 2015-02-22 DIAGNOSIS — Z23 Encounter for immunization: Secondary | ICD-10-CM

## 2015-02-22 DIAGNOSIS — Z0000 Encounter for general adult medical examination without abnormal findings: Secondary | ICD-10-CM

## 2015-02-22 DIAGNOSIS — G4700 Insomnia, unspecified: Secondary | ICD-10-CM | POA: Diagnosis not present

## 2015-02-22 DIAGNOSIS — Z119 Encounter for screening for infectious and parasitic diseases, unspecified: Secondary | ICD-10-CM

## 2015-02-22 DIAGNOSIS — Z720 Tobacco use: Secondary | ICD-10-CM

## 2015-02-22 DIAGNOSIS — E785 Hyperlipidemia, unspecified: Secondary | ICD-10-CM

## 2015-02-22 DIAGNOSIS — Z79899 Other long term (current) drug therapy: Secondary | ICD-10-CM | POA: Diagnosis not present

## 2015-02-22 DIAGNOSIS — R7309 Other abnormal glucose: Secondary | ICD-10-CM | POA: Diagnosis not present

## 2015-02-22 DIAGNOSIS — Z113 Encounter for screening for infections with a predominantly sexual mode of transmission: Secondary | ICD-10-CM

## 2015-02-22 DIAGNOSIS — I2510 Atherosclerotic heart disease of native coronary artery without angina pectoris: Secondary | ICD-10-CM | POA: Diagnosis not present

## 2015-02-22 DIAGNOSIS — R7303 Prediabetes: Secondary | ICD-10-CM

## 2015-02-22 LAB — POCT CBC
Granulocyte percent: 69.6 %G (ref 37–80)
HCT, POC: 41.7 % — AB (ref 43.5–53.7)
Hemoglobin: 14.3 g/dL (ref 14.1–18.1)
Lymph, poc: 2 (ref 0.6–3.4)
MCH, POC: 30.5 pg (ref 27–31.2)
MCHC: 34.2 g/dL (ref 31.8–35.4)
MCV: 89.1 fL (ref 80–97)
MID (cbc): 0.4 (ref 0–0.9)
MPV: 7 fL (ref 0–99.8)
POC GRANULOCYTE: 5.6 (ref 2–6.9)
POC LYMPH %: 24.9 % (ref 10–50)
POC MID %: 5.5 % (ref 0–12)
Platelet Count, POC: 213 10*3/uL (ref 142–424)
RBC: 4.68 M/uL — AB (ref 4.69–6.13)
RDW, POC: 14.2 %
WBC: 8 10*3/uL (ref 4.6–10.2)

## 2015-02-22 LAB — POCT GLYCOSYLATED HEMOGLOBIN (HGB A1C): HEMOGLOBIN A1C: 6

## 2015-02-22 MED ORDER — ZOSTER VACCINE LIVE 19400 UNT/0.65ML ~~LOC~~ SOLR: 0.6500 mL | Freq: Once | SUBCUTANEOUS | Status: DC

## 2015-02-22 NOTE — Progress Notes (Deleted)
I last saw pt 10 wks ago for his annual physical as well as medication review.  He did not have labs at that time - elected to defer to today so pt will need his screening labs (psa, hiv, hep C) in addition to monitoring of his chronic medical problems.  Pt hgba1c 6.1 4 mos ago - also had lipid panel at that time at goal Cardiologist is Dr. [name] -

## 2015-02-22 NOTE — Progress Notes (Signed)
Subjective:  This chart was scribed for Norberto Sorenson, MD by Stann Ore, Medical Scribe. This patient was seen in Room 22 and the patient's care was started 3:19 PM.    Patient ID: [name], male    DOB: [birth year], 64 y.o.   MRN: [id number] Chief Complaint  Patient presents with  . Follow-up  . abnormal labs    HPI [name] is a 64 y.o. male who presents to [facility] for follow up.  I last saw pt 10 wks ago for his annual physical as well as medication review.  He did not have labs at that time - elected to defer to today so pt will need his screening labs (psa, hiv, hep C) in addition to monitoring of his chronic medical problems.   Pre-DM: Pt hgba1c 6.1 4 mos ago - also had lipid panel at that time. Currently at goal <70, however he was having quite a few muscle cramps concern due to his lipitor. Been doing low carb diet with his wife, currently on diet with no "white foods" (no pasta, rice, white bread) at all for 2-3 weeks. His a1c is 6.0 today.   Cardiology: Pt's Cardiologist is Dr. [name]. Pt has had cardiac stents and CABG.   Tobacco: Still ongoing tobacco abuse. Smoking 2 ppd. He has tried patches, e-cig, chantix. Uses albuterol as needed for emphysema but no symptoms at baseline. Pt even failed the [place] smoking cessation classes.   BP: He is monitoring his BP at home. Was having some leg cramping from HCTZ blood pressure pill. Was recommended to increase water and potassium in diet and try magnesium supplements at night. Advised to try coenzyme and lipitor and do trials off lipitor if cramps continue. Checks BP at home and reads good. Stopped taking magnesium and hasn't had issues since then. He did take some 2 weeks ago for small right leg cramp.   Medications: Had his t-dap done last visit and is due for pneumovax and zostavax. Taking aspirin. He is currently good on medication.  Using tylenol PM for insomnia, if he doesn't take it, he doesn't sleep very well.  Denies constipation, dry mouth, wheezing, coughs, SOB.   Leg cramps: He states that his leg cramps are doing better. He got some potassium pills at [employer] and has no leg cramps so far. He also temporarily quit taking his lipitor.   Exercising: He's planning to go back to the gym more often with swimming and exercise.   Work: He does some part time at [employer]. 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     Past Medical History  Diagnosis Date  . COPD (chronic obstructive pulmonary disease)   . Myocardial infarction   . Hypertension   . Asthma     occ bronchitis  . GERD (gastroesophageal reflux disease)   . Arthritis   . Seasonal allergies   . Coronary artery disease     a. s/p multiple prior PCIs; b. s/p CABG 06/2006 (L-LAD, S-Int and OM, S-PDA);  c. ETT-Myoview 4/13: low risk, no ischemia, EF 53%  . Hx of echocardiogram     Echo 10/2006: normal LVF  . Hyperlipidemia    Current Outpatient Prescriptions on File Prior to Visit  Medication Sig Dispense Refill  . albuterol (PROVENTIL HFA;VENTOLIN HFA) 108 (90 BASE) MCG/ACT inhaler Inhale 2 puffs into the lungs every 4 (four) hours as needed for wheezing or shortness of breath (cough, shortness of breath  or wheezing.). 1 Inhaler 4  . amLODipine (NORVASC) 10 MG tablet Take 1 tablet (10 mg total) by mouth daily. 90 tablet 3  . aspirin 81 MG tablet Take 81 mg by mouth daily.    . cetirizine (ZYRTEC) 10 MG tablet Take 10 mg by mouth daily.    . diphenhydramine-acetaminophen (TYLENOL PM EXTRA STRENGTH) 25-500 MG TABS Take 2 tablets by mouth at bedtime as needed.    Marland Kitchen lisinopril-hydrochlorothiazide (PRINZIDE,ZESTORETIC) 10-12.5 MG per tablet Take 1 tablet by mouth daily. 90 tablet 3  . metoprolol succinate (TOPROL-XL) 50 MG 24 hr tablet Take 1 tablet (50 mg total) by mouth daily. Take with or immediately following a meal. 90 tablet 3  . naproxen sodium (ANAPROX) 220 MG tablet Take 220 mg by mouth 2 (two) times daily  with a meal. TAKE TWO TABS EVERY 12 HOURS FOR PAIN    . omeprazole (PRILOSEC) 20 MG capsule Take 1 capsule (20 mg total) by mouth daily. 30 capsule 12  . atorvastatin (LIPITOR) 80 MG tablet Take 1 tablet (80 mg total) by mouth daily at 6 PM. (Patient not taking: Reported on 02/22/2015) 90 tablet 3   No current facility-administered medications on file prior to visit.   Allergies  Allergen Reactions  . Codeine Nausea And Vomiting      Review of Systems  Constitutional: Negative for fever, chills, appetite change and fatigue.  HENT: Negative for congestion, rhinorrhea, sneezing and sore throat.   Respiratory: Negative for cough, shortness of breath and wheezing.   Gastrointestinal: Negative for nausea, vomiting, diarrhea and constipation.  Musculoskeletal: Positive for myalgias (leg cramps).       Objective:   Physical Exam  Constitutional: He is oriented to person, place, and time. He appears well-developed and well-nourished. No distress.  HENT:  Head: Normocephalic and atraumatic.  Eyes: EOM are normal. Pupils are equal, round, and reactive to light.  Neck: Neck supple. No thyromegaly present.  Cardiovascular: Normal rate, regular rhythm and normal heart sounds.  Exam reveals no gallop and no friction rub.   No murmur heard. Pulmonary/Chest: Effort normal and breath sounds normal. No respiratory distress. He has no wheezes.  Musculoskeletal: Normal range of motion.  Lymphadenopathy:    He has no cervical adenopathy.  Neurological: He is alert and oriented to person, place, and time.  Skin: Skin is warm and dry.  Psychiatric: He has a normal mood and affect. His behavior is normal.  Nursing note and vitals reviewed.   BP 153/76 mmHg  Pulse 85  Temp(Src) 98 F (36.7 C)  Resp 16  Ht 5' 9.5" (1.765 m)  Wt 217 lb (98.431 kg)  BMI 31.60 kg/m2  SpO2 97%  Recheck BP in room, sitting: 128/66     Assessment & Plan:   1. Essential hypertension   2. Atherosclerosis of  native coronary artery of native heart without angina pectoris   3. Gastroesophageal reflux disease, esophagitis presence not specified   4. Glucose intolerance (pre-diabetes)   5. Hyperlipidemia LDL goal <70   6. Insomnia   7. TOBACCO ABUSE   8. Medication management   9. Annual physical exam   10. Screening for prostate cancer   11. Screening examination for infectious disease   12. Screening for STD (sexually transmitted disease)   13. Need for prophylactic vaccination against Streptococcus pneumoniae (pneumococcus)     Orders Placed This Encounter  Procedures  . Pneumococcal polysaccharide vaccine 23-valent greater than or equal to 2yo subcutaneous/IM    Standing Status:  Future     Number of Occurrences:      Standing Expiration Date: 02/21/2017  . Comprehensive metabolic panel  . TSH  . Lipid panel    Standing Status: Future     Number of Occurrences:      Standing Expiration Date: 02/22/2016    Order Specific Question:  Has the patient fasted?    Answer:  Yes  . POCT glycosylated hemoglobin (Hb A1C)  . POCT CBC    Meds ordered this encounter  Medications  . zoster vaccine live, PF, (ZOSTAVAX) 16109 UNT/0.65ML injection    Sig: Inject 19,400 Units into the skin once.    Dispense:  1 vial    Refill:  0    I personally performed the services described in this documentation, which was scribed in my presence. The recorded information has been reviewed and considered, and addended by me as needed.  Norberto Sorenson, MD MPH  Results for orders placed or performed in visit on 02/22/15  Comprehensive metabolic panel  Result Value Ref Range   Sodium 138 135 - 145 mEq/L   Potassium 4.8 3.5 - 5.3 mEq/L   Chloride 103 96 - 112 mEq/L   CO2 22 19 - 32 mEq/L   Glucose, Bld 109 (H) 70 - 99 mg/dL   BUN 18 6 - 23 mg/dL   Creat 6.04 5.40 - 9.81 mg/dL   Total Bilirubin 0.4 0.2 - 1.2 mg/dL   Alkaline Phosphatase 90 39 - 117 U/L   AST 17 0 - 37 U/L   ALT 20 0 - 53 U/L   Total Protein  6.9 6.0 - 8.3 g/dL   Albumin 3.9 3.5 - 5.2 g/dL   Calcium 9.5 8.4 - 19.1 mg/dL  TSH  Result Value Ref Range   TSH 1.620 0.350 - 4.500 uIU/mL  PSA  Result Value Ref Range   PSA 0.34 <=4.00 ng/mL  HIV antibody  Result Value Ref Range   HIV 1&2 Ab, 4th Generation NONREACTIVE NONREACTIVE  Hepatitis C antibody  Result Value Ref Range   HCV Ab NEGATIVE NEGATIVE  POCT glycosylated hemoglobin (Hb A1C)  Result Value Ref Range   Hemoglobin A1C 6.0   POCT CBC  Result Value Ref Range   WBC 8.0 4.6 - 10.2 K/uL   Lymph, poc 2.0 0.6 - 3.4   POC LYMPH PERCENT 24.9 10 - 50 %L   MID (cbc) 0.4 0 - 0.9   POC MID % 5.5 0 - 12 %M   POC Granulocyte 5.6 2 - 6.9   Granulocyte percent 69.6 37 - 80 %G   RBC 4.68 (A) 4.69 - 6.13 M/uL   Hemoglobin 14.3 14.1 - 18.1 g/dL   HCT, POC 47.8 (A) 29.5 - 53.7 %   MCV 89.1 80 - 97 fL   MCH, POC 30.5 27 - 31.2 pg   MCHC 34.2 31.8 - 35.4 g/dL   RDW, POC 62.1 %   Platelet Count, POC 213.0 142 - 424 K/uL   MPV 7.0 0 - 99.8 fL

## 2015-02-23 LAB — COMPREHENSIVE METABOLIC PANEL
ALBUMIN: 3.9 g/dL (ref 3.5–5.2)
ALT: 20 U/L (ref 0–53)
AST: 17 U/L (ref 0–37)
Alkaline Phosphatase: 90 U/L (ref 39–117)
BUN: 18 mg/dL (ref 6–23)
CALCIUM: 9.5 mg/dL (ref 8.4–10.5)
CHLORIDE: 103 meq/L (ref 96–112)
CO2: 22 mEq/L (ref 19–32)
Creat: 0.92 mg/dL (ref 0.50–1.35)
Glucose, Bld: 109 mg/dL — ABNORMAL HIGH (ref 70–99)
POTASSIUM: 4.8 meq/L (ref 3.5–5.3)
Sodium: 138 mEq/L (ref 135–145)
TOTAL PROTEIN: 6.9 g/dL (ref 6.0–8.3)
Total Bilirubin: 0.4 mg/dL (ref 0.2–1.2)

## 2015-02-23 LAB — PSA: PSA: 0.34 ng/mL (ref <=4.00)

## 2015-02-23 LAB — HEPATITIS C ANTIBODY: HCV Ab: NEGATIVE

## 2015-02-23 LAB — TSH: TSH: 1.62 u[IU]/mL (ref 0.350–4.500)

## 2015-02-23 LAB — HIV ANTIBODY (ROUTINE TESTING W REFLEX): HIV 1&2 Ab, 4th Generation: NONREACTIVE

## 2015-08-22 ENCOUNTER — Telehealth: Payer: Self-pay | Admitting: Family Medicine

## 2015-08-22 ENCOUNTER — Encounter: Payer: 59 | Admitting: Family Medicine

## 2015-08-22 NOTE — Progress Notes (Signed)
This encounter was created in error - please disregard.

## 2015-08-22 NOTE — Progress Notes (Deleted)
   Subjective:    Patient ID: [name], male    DOB: [birth year], 64 y.o.   MRN: [id number]  HPI   Review of Systems     Objective:  There were no vitals taken for this visit.  Physical Exam        Assessment & Plan:

## 2015-08-22 NOTE — Telephone Encounter (Signed)
Pt "no showed" a 6 month f/u OV that was scheduled with me for a review of his chronic medical problems and medication refills.  The notes and plan that I made for this visit are below.  pneumovax was ordered at last visit but does not appear to have been given  Pre-DM: hgba1c 6.1->6.0 last yr. Doing low carb diet  CAD: Dr. [name], h/o cardiac stents and CABG. On asa  Tobacco abuse: Still ongoing tobacco abuse. Smoking 2 ppd. He has tried patches, e-cig, chantix. Uses albuterol as needed for emphysema but no symptoms at baseline. Pt even failed the [place] smoking cessation classes.   HTN: Monitoring it at home, hctz -> leg cramps so increase K in diet, water, and start Mg supp which resolved sxs. On amlodipine 10, lisinopril-hctz 10-12.5, toprol 50   HPL: Goal LDL <70. On lipitor 80 -> leg cramps advised trial of coenzyme q10 and trial off lipitor if cont but resolved.  Had zostavax?  COPD: prn albutyerol  Ordered: cbc, cmp, a1c, fasting lipids.

## 2015-08-23 ENCOUNTER — Ambulatory Visit: Payer: 59 | Admitting: Family Medicine

## 2015-09-04 ENCOUNTER — Telehealth: Payer: 59 | Admitting: Family

## 2015-09-04 DIAGNOSIS — J208 Acute bronchitis due to other specified organisms: Secondary | ICD-10-CM | POA: Diagnosis not present

## 2015-09-04 MED ORDER — PROMETHAZINE-DM 6.25-15 MG/5ML PO SYRP: 5.0000 mL | ORAL_SOLUTION | Freq: Three times a day (TID) | ORAL | Status: DC | PRN

## 2015-09-04 MED ORDER — PREDNISONE 10 MG (21) PO TBPK: 10.0000 mg | ORAL_TABLET | Freq: Every day | ORAL | Status: DC

## 2015-09-04 MED ORDER — BENZONATATE 200 MG PO CAPS: 200.0000 mg | ORAL_CAPSULE | Freq: Three times a day (TID) | ORAL | Status: DC | PRN

## 2015-09-04 NOTE — Progress Notes (Signed)
We are sorry that you are not feeling well.  Here is how we plan to help!  Based on what you have shared with me it looks like you have upper respiratory tract inflammation that has resulted in a significant cough.  Inflammation and infection in the upper respiratory tract is commonly called bronchitis and has four common causes:  Allergies, Viral Infections, Acid Reflux and Bacterial Infections.  Allergies, viruses and acid reflux are treated by controlling symptoms or eliminating the cause. An example might be a cough caused by taking certain blood pressure medications. You stop the cough by changing the medication. Another example might be a cough caused by acid reflux. Controlling the reflux helps control the cough.  Based on your presentation I believe you most likely have A cough due to a virus.  This is called viral bronchitis and is best treated by rest, plenty of fluids and control of the cough.  You may use Ibuprofen or Tylenol as directed to help your symptoms.    In addition you may use A non-prescription cough medication called Robitussin DAC. Take 2 teaspoons every 8 hours or Delsym: take 2 teaspoons every 12 hours., A non-prescription cough medication called Mucinex DM: take 2 tablets every 12 hours. and A prescription cough medication called Tessalon Perles 100mg . You may take 1-2 capsules every 8 hours as needed for your cough.   I also sent in a prednisone dose pack! I hope you feel better soon!!    HOME CARE . Only take medications as instructed by your medical team. . Complete the entire course of an antibiotic. . Drink plenty of fluids and get plenty of rest. . Avoid close contacts especially the very young and the elderly . Cover your mouth if you cough or cough into your sleeve. . Always remember to wash your hands . A steam or ultrasonic humidifier can help congestion.    GET HELP RIGHT AWAY IF: . You develop worsening fever. . You become short of breath . You cough up  blood. . Your symptoms persist after you have completed your treatment plan MAKE SURE YOU   Understand these instructions.  Will watch your condition.  Will get help right away if you are not doing well or get worse.  Your e-visit answers were reviewed by a board certified advanced clinical practitioner to complete your personal care plan.  Depending on the condition, your plan could have included both over the counter or prescription medications. If there is a problem please reply  once you have received a response from your provider. Your safety is important to [place].  If you have drug allergies check your prescription carefully.    You can use MyChart to ask questions about today's visit, request a non-urgent call back, or ask for a work or school excuse for 24 hours related to this e-Visit. If it has been greater than 24 hours you will need to follow up with your provider, or enter a new e-Visit to address those concerns. You will get an e-mail in the next two days asking about your experience.  I hope that your e-visit has been valuable and will speed your recovery. Thank you for using e-visits.

## 2015-09-04 NOTE — Addendum Note (Signed)
Addended by: Jannifer Rodney A on: 09/04/2015 11:57 AM   Modules accepted: Orders

## 2015-09-04 NOTE — Progress Notes (Signed)

## 2015-09-12 ENCOUNTER — Ambulatory Visit (INDEPENDENT_AMBULATORY_CARE_PROVIDER_SITE_OTHER): Payer: 59

## 2015-09-12 ENCOUNTER — Ambulatory Visit (INDEPENDENT_AMBULATORY_CARE_PROVIDER_SITE_OTHER): Payer: 59 | Admitting: Family Medicine

## 2015-09-12 VITALS — BP 152/68 | HR 60 | Temp 98.0°F | Resp 16 | Ht 69.0 in | Wt 204.0 lb

## 2015-09-12 DIAGNOSIS — J208 Acute bronchitis due to other specified organisms: Secondary | ICD-10-CM

## 2015-09-12 DIAGNOSIS — I2510 Atherosclerotic heart disease of native coronary artery without angina pectoris: Secondary | ICD-10-CM | POA: Diagnosis not present

## 2015-09-12 DIAGNOSIS — R5383 Other fatigue: Secondary | ICD-10-CM

## 2015-09-12 DIAGNOSIS — J988 Other specified respiratory disorders: Secondary | ICD-10-CM

## 2015-09-12 DIAGNOSIS — J22 Unspecified acute lower respiratory infection: Secondary | ICD-10-CM

## 2015-09-12 DIAGNOSIS — R05 Cough: Secondary | ICD-10-CM

## 2015-09-12 DIAGNOSIS — R062 Wheezing: Secondary | ICD-10-CM

## 2015-09-12 DIAGNOSIS — R059 Cough, unspecified: Secondary | ICD-10-CM

## 2015-09-12 DIAGNOSIS — R52 Pain, unspecified: Secondary | ICD-10-CM | POA: Diagnosis not present

## 2015-09-12 DIAGNOSIS — R6884 Jaw pain: Secondary | ICD-10-CM | POA: Diagnosis not present

## 2015-09-12 LAB — POCT CBC
Granulocyte percent: 72.6 %G (ref 37–80)
HCT, POC: 42.2 % — AB (ref 43.5–53.7)
HEMOGLOBIN: 14.4 g/dL (ref 14.1–18.1)
LYMPH, POC: 2.5 (ref 0.6–3.4)
MCH: 30.1 pg (ref 27–31.2)
MCHC: 34.2 g/dL (ref 31.8–35.4)
MCV: 87.9 fL (ref 80–97)
MID (CBC): 0.6 (ref 0–0.9)
MPV: 6.7 fL (ref 0–99.8)
PLATELET COUNT, POC: 189 10*3/uL (ref 142–424)
POC Granulocyte: 8.2 — AB (ref 2–6.9)
POC LYMPH PERCENT: 22.1 %L (ref 10–50)
POC MID %: 5.3 %M (ref 0–12)
RBC: 4.8 M/uL (ref 4.69–6.13)
RDW, POC: 14 %
WBC: 11.3 10*3/uL — AB (ref 4.6–10.2)

## 2015-09-12 LAB — POCT INFLUENZA A/B
INFLUENZA B, POC: NEGATIVE
Influenza A, POC: NEGATIVE

## 2015-09-12 MED ORDER — PREDNISONE 10 MG (21) PO TBPK: 10.0000 mg | ORAL_TABLET | Freq: Every day | ORAL | Status: DC

## 2015-09-12 MED ORDER — AZITHROMYCIN 250 MG PO TABS: ORAL_TABLET | ORAL | Status: DC

## 2015-09-12 MED FILL — predniSONE 10 MG (21) TBPK: 10 | 6 days supply | Qty: 21 | Fill #0

## 2015-09-12 MED FILL — AZITHROMYCIN 250 MG TABLET: 250 | 5 days supply | Qty: 6 | Fill #0

## 2015-09-12 NOTE — Patient Instructions (Addendum)
Start zpak to cover for possible early chest infection, restart prednisone, and use albuterol up to every 4 hours as needed. If not significantly improving  In next 2 days, or persistent need for albuterol in the next 2-3 days, return for repeat evaluation. Return to the clinic or go to the nearest emergency room if any of your symptoms worsen or new symptoms occur. If you have any chest pain or tightness, call 911 or go to the emergency room immediately.   Acute Bronchitis Bronchitis is inflammation of the airways that extend from the windpipe into the lungs (bronchi). The inflammation often causes mucus to develop. This leads to a cough, which is the most common symptom of bronchitis.  In acute bronchitis, the condition usually develops suddenly and goes away over time, usually in a couple weeks. Smoking, allergies, and asthma can make bronchitis worse. Repeated episodes of bronchitis may cause further lung problems.  CAUSES Acute bronchitis is most often caused by the same virus that causes a cold. The virus can spread from person to person (contagious) through coughing, sneezing, and touching contaminated objects. SIGNS AND SYMPTOMS   Cough.   Fever.   Coughing up mucus.   Body aches.   Chest congestion.   Chills.   Shortness of breath.   Sore throat.  DIAGNOSIS  Acute bronchitis is usually diagnosed through a physical exam. Your health care provider will also ask you questions about your medical history. Tests, such as chest X-rays, are sometimes done to rule out other conditions.  TREATMENT  Acute bronchitis usually goes away in a couple weeks. Oftentimes, no medical treatment is necessary. Medicines are sometimes given for relief of fever or cough. Antibiotic medicines are usually not needed but may be prescribed in certain situations. In some cases, an inhaler may be recommended to help reduce shortness of breath and control the cough. A cool mist vaporizer may also be  used to help thin bronchial secretions and make it easier to clear the chest.  HOME CARE INSTRUCTIONS  Get plenty of rest.   Drink enough fluids to keep your urine clear or pale yellow (unless you have a medical condition that requires fluid restriction). Increasing fluids may help thin your respiratory secretions (sputum) and reduce chest congestion, and it will prevent dehydration.   Take medicines only as directed by your health care provider.  If you were prescribed an antibiotic medicine, finish it all even if you start to feel better.  Avoid smoking and secondhand smoke. Exposure to cigarette smoke or irritating chemicals will make bronchitis worse. If you are a smoker, consider using nicotine gum or skin patches to help control withdrawal symptoms. Quitting smoking will help your lungs heal faster.   Reduce the chances of another bout of acute bronchitis by washing your hands frequently, avoiding people with cold symptoms, and trying not to touch your hands to your mouth, nose, or eyes.   Keep all follow-up visits as directed by your health care provider.  SEEK MEDICAL CARE IF: Your symptoms do not improve after 1 week of treatment.  SEEK IMMEDIATE MEDICAL CARE IF:  You develop an increased fever or chills.   You have chest pain.   You have severe shortness of breath.  You have bloody sputum.   You develop dehydration.  You faint or repeatedly feel like you are going to pass out.  You develop repeated vomiting.  You develop a severe headache. MAKE SURE YOU:   Understand these instructions.  Will watch your  condition.  Will get help right away if you are not doing well or get worse.   This information is not intended to replace advice given to you by your health care provider. Make sure you discuss any questions you have with your health care provider.   Document Released: 10/01/2004 Document Revised: 09/14/2014 Document Reviewed: 02/14/2013 Elsevier  Interactive Patient Education [employer]

## 2015-09-12 NOTE — Progress Notes (Addendum)
Subjective:    Patient ID: [name], male    DOB: Jul 05, 1951, 65 y.o.   MRN: [id number] By signing my name below, I, Javier Docker, attest that this documentation has been prepared under the direction and in the presence of Meredith Staggers, MD. Electronically Signed: Javier Docker, ER Scribe. 09/12/2015. 10:40 AM.  Chief Complaint  Patient presents with  . chest congestion    x 2 days   . Diarrhea  . Generalized Body Aches  . Fatigue  . Headache  . jaw pain    left    HPI HPI Comments: [name] is a 65 y.o. male with a hx of HTN and CAD statis post CABG with stent placement, October, 2007 (Cardiologist Dr. Myrtis Hopping) who presents to [facility] complaining of fatigue, HA, myalgias, diarrhea, nose and chest congestion. Most recently treated for bronchitis by e-visit on December 28th, he was prescribed prednisone dose pac, robitussin DC, mucinex, and tessalon pearls. He was compliant with all his medications. He states his sx were nearly resolved by three days ago when he finished the course of treatment, but then yesterday his sx recurred. He is having HA, myalgias, nasal and chest congestion, wheezing, and fatigue. He states he wheezes whenever he gets bronchitis. He is smoking 1 PPD. He has not had a flu shot. He endorses sick contacts. His PCP is Dr. Clelia Croft.   He is also complaining of left jaw aching. He denies chest pain, or pain down the left arm. He denies SOB when he walks up stairs. He denies vision changes, or blurry vision. Dentures in place, jaw was non tender.     Patient Active Problem List   Diagnosis Date Noted  . Prediabetes 12/19/2014  . GERD (gastroesophageal reflux disease) 12/19/2014  . CLAUDICATION 09/04/2010  . TOBACCO ABUSE 08/15/2009  . Hyperlipidemia LDL goal <70 08/12/2009  . Essential hypertension 08/12/2009  . MYOCARDIAL INFARCTION 08/12/2009  . Coronary atherosclerosis 08/12/2009  . ISCHEMIC CARDIOMYOPATHY 08/12/2009  . 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 SYNCOPE 08/12/2009  . Insomnia 08/12/2009   Past Medical History  Diagnosis Date  . COPD (chronic obstructive pulmonary disease) (HCC)   . Myocardial infarction (HCC)   . Hypertension   . Asthma     occ bronchitis  . GERD (gastroesophageal reflux disease)   . Arthritis   . Seasonal allergies   . Coronary artery disease     a. s/p multiple prior PCIs; b. s/p CABG 06/2006 (L-LAD, S-Int and OM, S-PDA);  c. ETT-Myoview 4/13: low risk, no ischemia, EF 53%  . Hx of echocardiogram     Echo 10/2006: normal LVF  . Hyperlipidemia    Past Surgical History  Procedure Laterality Date  . Coronary artery bypass graft  2007  . Coronary stent placement      x5 prior to cabg  . Tonsillectomy    . Colonoscopy    . Cardiac catheterization    . Knee arthroscopy      rt knee  . Acne cyst removal    . Shoulder arthroscopy  12/14/2011    removed bone spurs and repositioned muscle.    Allergies  Allergen Reactions  . Codeine Nausea And Vomiting   Prior to Admission medications   Medication Sig Start Date End Date Taking? Authorizing Provider  albuterol (PROVENTIL HFA;VENTOLIN HFA) 108 (90 BASE) MCG/ACT inhaler Inhale 2 puffs into the lungs every 4 (four) hours as needed for wheezing or shortness of breath (cough, shortness of breath or  wheezing.). 08/27/14  Yes Stephanie D English, PA  amLODipine (NORVASC) 10 MG tablet Take 1 tablet (10 mg total) by mouth daily. 12/19/14  Yes Sherren Mocha, MD  aspirin 81 MG tablet Take 81 mg by mouth daily.   Yes Historical Provider, MD  cetirizine (ZYRTEC) 10 MG tablet Take 10 mg by mouth daily.   Yes Historical Provider, MD  diphenhydramine-acetaminophen (TYLENOL PM EXTRA STRENGTH) 25-500 MG TABS Take 2 tablets by mouth at bedtime as needed.   Yes Historical Provider, MD  lisinopril-hydrochlorothiazide (PRINZIDE,ZESTORETIC) 10-12.5 MG per tablet Take 1 tablet by mouth daily. 12/19/14  Yes Sherren Mocha, MD  metoprolol succinate (TOPROL-XL) 50 MG 24 hr  tablet Take 1 tablet (50 mg total) by mouth daily. Take with or immediately following a meal. 12/19/14  Yes Sherren Mocha, MD  naproxen sodium (ANAPROX) 220 MG tablet Take 220 mg by mouth 2 (two) times daily with a meal. TAKE TWO TABS EVERY 12 HOURS FOR PAIN   Yes Historical Provider, MD  omeprazole (PRILOSEC) 20 MG capsule Take 1 capsule (20 mg total) by mouth daily. 12/19/14  Yes Sherren Mocha, MD  predniSONE (STERAPRED UNI-PAK 21 TAB) 10 MG (21) TBPK tablet Take 1 tablet (10 mg total) by mouth daily. As directed x 6 days 09/04/15  Yes Junie Spencer, FNP  promethazine-dextromethorphan (PROMETHAZINE-DM) 6.25-15 MG/5ML syrup Take 5 mLs by mouth 3 (three) times daily as needed for cough. 09/04/15  Yes Junie Spencer, FNP   Social History   Social History  . Marital Status: Married    Spouse Name: N/A  . Number of Children: N/A  . Years of Education: N/A   Occupational History  . RETAIL    Social History Main Topics  . Smoking status: Current Every Day Smoker -- 1.00 packs/day    Types: Cigarettes  . Smokeless tobacco: Never Used  . Alcohol Use: 0.0 oz/week    0 Standard drinks or equivalent per week     Comment: occasional - BEER OR WINE  . Drug Use: No  . Sexual Activity: Not on file   Other Topics Concern  . 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     Review of Systems  Constitutional: Positive for fatigue. Negative for fever and chills.  HENT: Positive for congestion and rhinorrhea.   Respiratory: Positive for cough.   Cardiovascular: Negative for chest pain and palpitations.  Gastrointestinal: Positive for diarrhea.      Objective:  BP 152/68 mmHg  Pulse 60  Temp(Src) 98 F (36.7 C) (Oral)  Resp 16  Ht 5\' 9"  (1.753 m)  Wt 204 lb (92.534 kg)  BMI 30.11 kg/m2  SpO2 97%  Physical Exam  Constitutional: He is oriented to person, place, and time. He appears well-developed and  well-nourished. No distress.  HENT:  Head: Normocephalic and atraumatic.  Mouth/Throat: Oropharynx is clear and moist.  Eyes: Pupils are equal, round, and reactive to light.  Neck: Neck supple.  Cardiovascular: Normal rate, regular rhythm and normal heart sounds.  Exam reveals no gallop and no friction rub.   No murmur heard. Pulmonary/Chest: Effort normal. No respiratory distress.  Breath sounds are distant with possible faint wheeze.   Musculoskeletal: Normal range of motion.  Neurological: He is alert and oriented to person, place, and time. Coordination normal.  Skin: Skin is warm and dry. He is not diaphoretic.  Psychiatric: He has a normal mood and affect. His behavior is normal.  Nursing note and vitals reviewed.  [facility] reading (PRIMARY) by  Dr. Neva Seat. Chest x-ray possible increased markings verus scar left lower lobe. Without apparent increased infiltrate otherwise.   EKG read by Dr. Neva Seat: Sinus rhythm no apparent significant change from EKG in 2016. Possible early repole V1-V3.      Assessment & Plan:   [name] is a 65 y.o. male Cough - Plan: POCT CBC, DG Chest 2 View, POCT Influenza A/B, azithromycin (ZITHROMAX) 250 MG tablet  Other fatigue - Plan: POCT CBC, EKG 12-Lead  Wheezing - Plan: DG Chest 2 View  Body aches - Plan: POCT CBC, POCT Influenza A/B  Pain in lower jaw - Plan: EKG 12-Lead  Coronary artery disease involving native coronary artery of native heart without angina pectoris - Plan: EKG 12-Lead  Acute viral bronchitis - Plan: predniSONE (STERAPRED UNI-PAK 21 TAB) 10 MG (21) TBPK tablet  LRTI (lower respiratory tract infection) - Plan: azithromycin (ZITHROMAX) 250 MG tablet   suspected initial viral bronchitis, with underlying tobacco use, wheezing with infections, COPD  Possible with acute flare. Did have some improvement with prednisone from the visit, now with worsening. Possible early lower respiratory tract infection versus COPD flare. Will  start azithromycin, restart prednisone, instructed to use albuterol home up every 4-6 hours as needed. Follow-up in next 2-3 days if persistent albuterol need or not significant improved. RTC/ER precautions discussed in the interim.  Left jaw pain, with headache and other myalgias.   No chest pains or tightness. No acute concerning findings on EKG. Does have history of heart disease, but doubt his jaw pain is from cardiac source at this time. Advised ER/911 precautions if any chest pain, tightness or worsening of the symptoms. Understanding expressed.  Meds ordered this encounter  Medications  . predniSONE (STERAPRED UNI-PAK 21 TAB) 10 MG (21) TBPK tablet    Sig: Take 1 tablet (10 mg total) by mouth daily. As directed x 6 days    Dispense:  21 tablet    Refill:  0  . azithromycin (ZITHROMAX) 250 MG tablet    Sig: Take 2 pills by mouth on day 1, then 1 pill by mouth per day on days 2 through 5.    Dispense:  6 tablet    Refill:  0   Patient Instructions  Start zpak to cover for possible early chest infection, restart prednisone, and use albuterol up to every 4 hours as needed. If not significantly improving  In next 2 days, or persistent need for albuterol in the next 2-3 days, return for repeat evaluation. Return to the clinic or go to the nearest emergency room if any of your symptoms worsen or new symptoms occur. If you have any chest pain or tightness, call 911 or go to the emergency room immediately.   Acute Bronchitis Bronchitis is inflammation of the airways that extend from the windpipe into the lungs (bronchi). The inflammation often causes mucus to develop. This leads to a cough, which is the most common symptom of bronchitis.  In acute bronchitis, the condition usually develops suddenly and goes away over time, usually in a couple weeks. Smoking, allergies, and asthma can make bronchitis worse. Repeated episodes of bronchitis may cause further lung problems.  CAUSES Acute bronchitis  is most often caused by the same virus that causes a cold. The virus can spread from person to person (contagious) through coughing, sneezing, and touching contaminated objects.  SIGNS AND SYMPTOMS   Cough.   Fever.   Coughing up mucus.   Body aches.   Chest congestion.   Chills.   Shortness of breath.   Sore throat.  DIAGNOSIS  Acute bronchitis is usually diagnosed through a physical exam. Your health care provider will also ask you questions about your medical history. Tests, such as chest X-rays, are sometimes done to rule out other conditions.  TREATMENT  Acute bronchitis usually goes away in a couple weeks. Oftentimes, no medical treatment is necessary. Medicines are sometimes given for relief of fever or cough. Antibiotic medicines are usually not needed but may be prescribed in certain situations. In some cases, an inhaler may be recommended to help reduce shortness of breath and control the cough. A cool mist vaporizer may also be used to help thin bronchial secretions and make it easier to clear the chest.  HOME CARE INSTRUCTIONS  Get plenty of rest.   Drink enough fluids to keep your urine clear or pale yellow (unless you have a medical condition that requires fluid restriction). Increasing fluids may help thin your respiratory secretions (sputum) and reduce chest congestion, and it will prevent dehydration.   Take medicines only as directed by your health care provider.  If you were prescribed an antibiotic medicine, finish it all even if you start to feel better.  Avoid smoking and secondhand smoke. Exposure to cigarette smoke or irritating chemicals will make bronchitis worse. If you are a smoker, consider using nicotine gum or skin patches to help control withdrawal symptoms. Quitting smoking will help your lungs heal faster.   Reduce the chances of another bout of acute bronchitis by washing your hands frequently, avoiding people with cold symptoms, and  trying not to touch your hands to your mouth, nose, or eyes.   Keep all follow-up visits as directed by your health care provider.  SEEK MEDICAL CARE IF: Your symptoms do not improve after 1 week of treatment.  SEEK IMMEDIATE MEDICAL CARE IF:  You develop an increased fever or chills.   You have chest pain.   You have severe shortness of breath.  You have bloody sputum.   You develop dehydration.  You faint or repeatedly feel like you are going to pass out.  You develop repeated vomiting.  You develop a severe headache. MAKE SURE YOU:   Understand these instructions.  Will watch your condition.  Will get help right away if you are not doing well or get worse.   This information is not intended to replace advice given to you by your health care provider. Make sure you discuss any questions you have with your health care provider.   Document Released: 10/01/2004 Document Revised: 09/14/2014 Document Reviewed: 02/14/2013 Elsevier Interactive Patient Education [employer]         I personally performed the services described in this documentation, which was scribed in my presence. The recorded information has been reviewed and considered, and addended by me as needed.

## 2015-09-13 DIAGNOSIS — H2513 Age-related nuclear cataract, bilateral: Secondary | ICD-10-CM | POA: Diagnosis not present

## 2015-09-13 DIAGNOSIS — H5203 Hypermetropia, bilateral: Secondary | ICD-10-CM | POA: Diagnosis not present

## 2015-09-13 DIAGNOSIS — H52223 Regular astigmatism, bilateral: Secondary | ICD-10-CM | POA: Diagnosis not present

## 2015-09-13 DIAGNOSIS — H524 Presbyopia: Secondary | ICD-10-CM | POA: Diagnosis not present

## 2015-10-23 ENCOUNTER — Ambulatory Visit (INDEPENDENT_AMBULATORY_CARE_PROVIDER_SITE_OTHER): Payer: 59 | Admitting: Cardiology

## 2015-10-23 ENCOUNTER — Encounter: Payer: Self-pay | Admitting: Cardiology

## 2015-10-23 VITALS — BP 144/74 | HR 74 | Ht 69.0 in | Wt 208.0 lb

## 2015-10-23 DIAGNOSIS — E785 Hyperlipidemia, unspecified: Secondary | ICD-10-CM | POA: Diagnosis not present

## 2015-10-23 DIAGNOSIS — R0989 Other specified symptoms and signs involving the circulatory and respiratory systems: Secondary | ICD-10-CM

## 2015-10-23 DIAGNOSIS — I10 Essential (primary) hypertension: Secondary | ICD-10-CM

## 2015-10-23 DIAGNOSIS — I2510 Atherosclerotic heart disease of native coronary artery without angina pectoris: Secondary | ICD-10-CM

## 2015-10-23 DIAGNOSIS — F17200 Nicotine dependence, unspecified, uncomplicated: Secondary | ICD-10-CM

## 2015-10-23 MED ORDER — LISINOPRIL 5 MG PO TABS: 5.0000 mg | ORAL_TABLET | Freq: Every day | ORAL | Status: DC

## 2015-10-23 MED ORDER — ROSUVASTATIN CALCIUM 40 MG PO TABS: 40.0000 mg | ORAL_TABLET | Freq: Every day | ORAL | Status: DC

## 2015-10-23 MED FILL — ROSUVASTATIN CALCIUM 40 MG: 40 | 90 days supply | Qty: 90 | Fill #0

## 2015-10-23 MED FILL — LISINOPRIL 5 MG TABLET: 5 | 90 days supply | Qty: 90 | Fill #0

## 2015-10-23 NOTE — Assessment & Plan Note (Addendum)
Patient's blood pressure is mildly elevated. He states it was running low on lisinopril HCT 10/12.5 daily. We will discontinue this medication and instead treat with lisinopril 5 mg daily. Check potassium and renal function in 4 weeks.

## 2015-10-23 NOTE — Assessment & Plan Note (Signed)
Patient counseled on discontinuing. 

## 2015-10-23 NOTE — Assessment & Plan Note (Addendum)
Patient had difficulties with Lipitor 80 mg daily. I will try Crestor 40 mg daily. Check lipids and liver in 4 weeks.

## 2015-10-23 NOTE — Assessment & Plan Note (Signed)
Continue aspirin and statin. 

## 2015-10-23 NOTE — Progress Notes (Signed)
HPI: FU CAD; s/p multiple prior PCIs, s/p subsequent CABG 06/2006 (LIMA to the LAD, sequential saphenous vein graft to the intermediate and obtuse marginal, and a saphenous vein graft to the PDA). Carotid Dopplers in April of 2005 showed normal arteries. His last echocardiogram in February 2008 showed preserved LV function. ABIs for leg pain in Jan 2012 were normal. ETT-Myoview 4/13: No ischemia, EF 53%, low risk. Since last seen, He denies dyspnea, chest pain, palpitations or syncope. He does note his blood pressure was running low on lisinopril HCT.  Current Outpatient Prescriptions  Medication Sig Dispense Refill  . albuterol (PROVENTIL HFA;VENTOLIN HFA) 108 (90 BASE) MCG/ACT inhaler Inhale 2 puffs into the lungs every 4 (four) hours as needed for wheezing or shortness of breath (cough, shortness of breath or wheezing.). 1 Inhaler 4  . amLODipine (NORVASC) 10 MG tablet Take 1 tablet (10 mg total) by mouth daily. 90 tablet 3  . aspirin 81 MG tablet Take 81 mg by mouth daily.    . cetirizine (ZYRTEC) 10 MG tablet Take 10 mg by mouth daily.    . diphenhydramine-acetaminophen (TYLENOL PM EXTRA STRENGTH) 25-500 MG TABS Take 2 tablets by mouth at bedtime as needed.    Marland Kitchen lisinopril-hydrochlorothiazide (PRINZIDE,ZESTORETIC) 10-12.5 MG per tablet Take 1 tablet by mouth daily. 90 tablet 3  . metoprolol succinate (TOPROL-XL) 50 MG 24 hr tablet Take 1 tablet (50 mg total) by mouth daily. Take with or immediately following a meal. 90 tablet 3  . naproxen sodium (ANAPROX) 220 MG tablet Take 220 mg by mouth 2 (two) times daily with a meal. TAKE TWO TABS EVERY 12 HOURS FOR PAIN    . omeprazole (PRILOSEC) 20 MG capsule Take 1 capsule (20 mg total) by mouth daily. 30 capsule 12   No current facility-administered medications for this visit.     Past Medical History  Diagnosis Date  . COPD (chronic obstructive pulmonary disease) (HCC)   . Myocardial infarction (HCC)   . Hypertension   . Asthma    occ bronchitis  . GERD (gastroesophageal reflux disease)   . Arthritis   . Seasonal allergies   . Coronary artery disease     a. s/p multiple prior PCIs; b. s/p CABG 06/2006 (L-LAD, S-Int and OM, S-PDA);  c. ETT-Myoview 4/13: low risk, no ischemia, EF 53%  . Hx of echocardiogram     Echo 10/2006: normal LVF  . Hyperlipidemia     Past Surgical History  Procedure Laterality Date  . Coronary artery bypass graft  2007  . Coronary stent placement      x5 prior to cabg  . Tonsillectomy    . Colonoscopy    . Cardiac catheterization    . Knee arthroscopy      rt knee  . Acne cyst removal    . Shoulder arthroscopy  12/14/2011    removed bone spurs and repositioned muscle.     Social History   Social History  . Marital Status: Married    Spouse Name: N/A  . Number of Children: N/A  . Years of Education: N/A   Occupational History  . RETAIL    Social History Main Topics  . Smoking status: Current Every Day Smoker -- 1.00 packs/day    Types: Cigarettes  . Smokeless tobacco: Never Used  . Alcohol Use: 0.0 oz/week    0 Standard drinks or equivalent per week     Comment: occasional - BEER OR WINE  . Drug Use: No  .  Sexual Activity: Not on file   Other Topics Concern  . 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     Family History  Problem Relation Age of Onset  . Anesthesia problems Neg Hx   . Coronary artery disease Father     brothers, sisters  . Heart disease Father   . Diabetes Mother   . Heart disease Sister   . Heart disease Sister   . Heart disease Brother     ROS: no fevers or chills, productive cough, hemoptysis, dysphasia, odynophagia, melena, hematochezia, dysuria, hematuria, rash, seizure activity, orthopnea, PND, pedal edema, claudication. Remaining systems are negative.  Physical Exam: Well-developed well-nourished in no acute distress.  Skin is warm and dry.    HEENT is normal.  Neck is supple.  Chest is clear to auscultation with normal expansion.  Cardiovascular exam is regular rate and rhythm.  Abdominal exam nontender or distended. No masses palpated. Positive bruit Extremities show no edema. neuro grossly intact  ECG 09/12/2015-sinus rhythm with minor nonspecific ST changes.

## 2015-10-23 NOTE — Patient Instructions (Signed)
Medication Instructions:   STOP LISINOPRIL/HCTZ  STOP LIPITOR  START LISINOPRIL 5 MG ONCE DAILY  START ROSUVASTATIN 40 MG ONCE DAILY  Labwork:  Your physician recommends that you return for lab work in: 4 WEEKS= DO NOT EAT PRIOR TO LAB WORK  Testing/Procedures:  Your physician has requested that you have an abdominal aorta duplex. During this test, an ultrasound is used to evaluate the aorta. Allow 30 minutes for this exam. Do not eat after midnight the day before and avoid carbonated beverages  NOTHING TO EAT OR DRINK 6 HOURS PRIOR TO TEST  Follow-Up:  Your physician wants you to follow-up in: ONE YEAR WITH DR Shelda Pal will receive a reminder letter in the mail two months in advance. If you don't receive a letter, please call our office to schedule the follow-up appointment.   If you need a refill on your cardiac medications before your next appointment, please call your pharmacy.

## 2015-10-23 NOTE — Assessment & Plan Note (Signed)
Abdominal ultrasound to exclude aneurysm. 

## 2015-11-01 MED FILL — AMLODIPINE BESYLATE 10 MG T: 10 | 90 days supply | Qty: 90 | Fill #2

## 2015-11-01 MED FILL — METOPROLOL SUCC ER 50 MG TA: 50 | 90 days supply | Qty: 90 | Fill #3

## 2015-11-19 ENCOUNTER — Ambulatory Visit (INDEPENDENT_AMBULATORY_CARE_PROVIDER_SITE_OTHER): Payer: 59

## 2015-11-19 DIAGNOSIS — R0989 Other specified symptoms and signs involving the circulatory and respiratory systems: Secondary | ICD-10-CM

## 2015-11-19 DIAGNOSIS — I77811 Abdominal aortic ectasia: Secondary | ICD-10-CM | POA: Diagnosis not present

## 2015-11-19 DIAGNOSIS — E785 Hyperlipidemia, unspecified: Secondary | ICD-10-CM | POA: Diagnosis not present

## 2015-11-20 ENCOUNTER — Other Ambulatory Visit: Payer: 59

## 2015-11-20 LAB — HEPATIC FUNCTION PANEL
ALBUMIN: 3.5 g/dL — AB (ref 3.6–5.1)
ALT: 10 U/L (ref 9–46)
AST: 11 U/L (ref 10–35)
Alkaline Phosphatase: 94 U/L (ref 40–115)
Total Bilirubin: 0.3 mg/dL (ref 0.2–1.2)
Total Protein: 6.6 g/dL (ref 6.1–8.1)

## 2015-11-20 LAB — LIPID PANEL
CHOL/HDL RATIO: 5.5 ratio — AB (ref <=5.0)
CHOLESTEROL: 164 mg/dL (ref 125–200)
HDL: 30 mg/dL — AB (ref >=40)
LDL Cholesterol: 79 mg/dL (ref <130)
Triglycerides: 277 mg/dL — ABNORMAL HIGH (ref <150)
VLDL: 55 mg/dL — AB (ref <30)

## 2015-11-20 LAB — BASIC METABOLIC PANEL
BUN: 12 mg/dL (ref 7–25)
CALCIUM: 9.3 mg/dL (ref 8.6–10.3)
CHLORIDE: 101 mmol/L (ref 98–110)
CO2: 25 mmol/L (ref 20–31)
Creat: 0.81 mg/dL (ref 0.70–1.25)
GLUCOSE: 84 mg/dL (ref 65–99)
POTASSIUM: 5.3 mmol/L (ref 3.5–5.3)
SODIUM: 139 mmol/L (ref 135–146)

## 2015-11-26 MED FILL — OMEPRAZOLE DR 20 MG CAPSULE: 20 | 90 days supply | Qty: 90 | Fill #0

## 2016-02-17 ENCOUNTER — Other Ambulatory Visit: Payer: Self-pay | Admitting: Family Medicine

## 2016-02-20 ENCOUNTER — Other Ambulatory Visit: Payer: Self-pay | Admitting: Family Medicine

## 2016-02-20 NOTE — Telephone Encounter (Signed)
Patient understood needs an appointment before this refill runs out

## 2016-02-21 ENCOUNTER — Ambulatory Visit (INDEPENDENT_AMBULATORY_CARE_PROVIDER_SITE_OTHER): Payer: 59 | Admitting: Family Medicine

## 2016-02-21 VITALS — BP 150/80 | HR 66 | Temp 97.5°F | Resp 17 | Ht 69.0 in | Wt 214.0 lb

## 2016-02-21 DIAGNOSIS — I2510 Atherosclerotic heart disease of native coronary artery without angina pectoris: Secondary | ICD-10-CM

## 2016-02-21 DIAGNOSIS — K219 Gastro-esophageal reflux disease without esophagitis: Secondary | ICD-10-CM | POA: Diagnosis not present

## 2016-02-21 DIAGNOSIS — I10 Essential (primary) hypertension: Secondary | ICD-10-CM | POA: Diagnosis not present

## 2016-02-21 DIAGNOSIS — F17200 Nicotine dependence, unspecified, uncomplicated: Secondary | ICD-10-CM | POA: Diagnosis not present

## 2016-02-21 DIAGNOSIS — R7303 Prediabetes: Secondary | ICD-10-CM | POA: Diagnosis not present

## 2016-02-21 DIAGNOSIS — E785 Hyperlipidemia, unspecified: Secondary | ICD-10-CM

## 2016-02-21 LAB — POCT GLYCOSYLATED HEMOGLOBIN (HGB A1C): Hemoglobin A1C: 6.3

## 2016-02-21 MED ORDER — AMLODIPINE BESYLATE 10 MG PO TABS: 10.0000 mg | ORAL_TABLET | Freq: Every day | ORAL | Status: DC

## 2016-02-21 MED ORDER — OMEPRAZOLE 20 MG PO CPDR: 20.0000 mg | DELAYED_RELEASE_CAPSULE | Freq: Every day | ORAL | Status: DC

## 2016-02-21 MED ORDER — METOPROLOL SUCCINATE ER 50 MG PO TB24: ORAL_TABLET | ORAL | Status: DC

## 2016-02-21 MED ORDER — PITAVASTATIN CALCIUM 1 MG PO TABS: 1.0000 mg | ORAL_TABLET | Freq: Every day | ORAL | Status: DC

## 2016-02-21 MED FILL — LIVALO 1 MG TABLET: 1 | 30 days supply | Qty: 30 | Fill #0

## 2016-02-21 MED FILL — METOPROLOL SUCC ER 50 MG TA: 50 | 90 days supply | Qty: 90 | Fill #0

## 2016-02-21 MED FILL — OMEPRAZOLE DR 20 MG CAPSULE: 20 | 30 days supply | Qty: 30 | Fill #0

## 2016-02-21 MED FILL — AMLODIPINE BESYLATE 10 MG T: 10 | 90 days supply | Qty: 90 | Fill #0

## 2016-02-21 NOTE — Patient Instructions (Addendum)
Try the pitavastatin - if this causes muscle aches, then we know you really can't tolerate any of them. Take coenzyme q10 with it.  It would be good to go down on your amlodipine to 1/2 tab so that you could take the lisinopril every day - lisinopril has a protective effect for the heart and kidneys even beyond just lowering blood pressure where amlodipine does not.   IF you received an x-ray today, you will receive an invoice from [facility]. Please contact [facility] at [phone] with questions or concerns regarding your invoice.   IF you received labwork today, you will receive an invoice from [employer]. Please contact [facility] at [phone] with questions or concerns regarding your invoice.   Our billing staff will not be able to assist you with questions regarding bills from these companies.  You will be contacted with the lab results as soon as they are available. The fastest way to get your results is to activate your My Chart account. Instructions are located on the last page of this paperwork. If you have not heard from [place] regarding the results in 2 weeks, please contact this office.     Smoking Cessation, Tips for Success If you are ready to quit smoking, congratulations! You have chosen to help yourself be healthier. Cigarettes bring nicotine, tar, carbon monoxide, and other irritants into your body. Your lungs, heart, and blood vessels will be able to work better without these poisons. There are many different ways to quit smoking. Nicotine gum, nicotine patches, a nicotine inhaler, or nicotine nasal spray can help with physical craving. Hypnosis, support groups, and medicines help break the habit of smoking. WHAT THINGS CAN I DO TO MAKE QUITTING EASIER?  Here are some tips to help you quit for good:  Pick a date when you will quit smoking completely. Tell all of your friends and family about your plan to quit on that date.  Do  not try to slowly cut down on the number of cigarettes you are smoking. Pick a quit date and quit smoking completely starting on that day.  Throw away all cigarettes.   Clean and remove all ashtrays from your home, work, and car.  On a card, write down your reasons for quitting. Carry the card with you and read it when you get the urge to smoke.  Cleanse your body of nicotine. Drink enough water and fluids to keep your urine clear or pale yellow. Do this after quitting to flush the nicotine from your body.  Learn to predict your moods. Do not let a bad situation be your excuse to have a cigarette. Some situations in your life might tempt you into wanting a cigarette.  Never have "just one" cigarette. It leads to wanting another and another. Remind yourself of your decision to quit.  Change habits associated with smoking. If you smoked while driving or when feeling stressed, try other activities to replace smoking. Stand up when drinking your coffee. Brush your teeth after eating. Sit in a different chair when you read the paper. Avoid alcohol while trying to quit, and try to drink fewer caffeinated beverages. Alcohol and caffeine may urge you to smoke.  Avoid foods and drinks that can trigger a desire to smoke, such as sugary or spicy foods and alcohol.  Ask people who smoke not to smoke around you.  Have something planned to do right after eating or having a cup of coffee. For example, plan to take a  walk or exercise.  Try a relaxation exercise to calm you down and decrease your stress. Remember, you may be tense and nervous for the first 2 weeks after you quit, but this will pass.  Find new activities to keep your hands busy. Play with a pen, coin, or rubber band. Doodle or draw things on paper.  Brush your teeth right after eating. This will help cut down on the craving for the taste of tobacco after meals. You can also try mouthwash.   Use oral substitutes in place of cigarettes.  Try using lemon drops, carrots, cinnamon sticks, or chewing gum. Keep them handy so they are available when you have the urge to smoke.  When you have the urge to smoke, try deep breathing.  Designate your home as a nonsmoking area.  If you are a heavy smoker, ask your health care provider about a prescription for nicotine chewing gum. It can ease your withdrawal from nicotine.  Reward yourself. Set aside the cigarette money you save and buy yourself something nice.  Look for support from others. Join a support group or smoking cessation program. Ask someone at home or at work to help you with your plan to quit smoking.  Always ask yourself, "Do I need this cigarette or is this just a reflex?" Tell yourself, "Today, I choose not to smoke," or "I do not want to smoke." You are reminding yourself of your decision to quit.  Do not replace cigarette smoking with electronic cigarettes (commonly called e-cigarettes). The safety of e-cigarettes is unknown, and some may contain harmful chemicals.  If you relapse, do not give up! Plan ahead and think about what you will do the next time you get the urge to smoke. HOW WILL I FEEL WHEN I QUIT SMOKING? You may have symptoms of withdrawal because your body is used to nicotine (the addictive substance in cigarettes). You may crave cigarettes, be irritable, feel very hungry, cough often, get headaches, or have difficulty concentrating. The withdrawal symptoms are only temporary. They are strongest when you first quit but will go away within 10-14 days. When withdrawal symptoms occur, stay in control. Think about your reasons for quitting. Remind yourself that these are signs that your body is healing and getting used to being without cigarettes. Remember that withdrawal symptoms are easier to treat than the major diseases that smoking can cause.  Even after the withdrawal is over, expect periodic urges to smoke. However, these cravings are generally short lived  and will go away whether you smoke or not. Do not smoke! WHAT RESOURCES ARE AVAILABLE TO HELP ME QUIT SMOKING? Your health care provider can direct you to community resources or hospitals for support, which may include:  Group support.  Education.  Hypnosis.  Therapy.   This information is not intended to replace advice given to you by your health care provider. Make sure you discuss any questions you have with your health care provider.   Document Released: 05/22/2004 Document Revised: 09/14/2014 Document Reviewed: 02/09/2013 Elsevier Interactive Patient Education [employer]

## 2016-02-21 NOTE — Progress Notes (Signed)
Subjective:  By signing my name below, I, [name], attest that this documentation has been prepared under the direction and in the presence of Norberto Sorenson , MD.  Electronically Signed: Andrew Au, ED Scribe. 02/21/2016. 10:50 AM.   Patient ID: [name], male    DOB: 1951/02/06, 65 y.o.   MRN: [id number]  HPI Chief Complaint  Patient presents with  . Medication Refill    norvasc,toprol    HPI Comments: [name] is a 65 y.o. male who presents to the [facility] for a medication refill. Last seen by me 1 years prior. Pt has pre-diabetes, A1C - 6.0. Hx of CAD, status post CABG and stents. Followed by Dr. [name]. Ongoing tobacco use.   Hypertension Pt was seen by Dr. [name] 4 months ago. At that time lisinopril HCTZ was discontinued, due to pt havin low readings outside of office, and was started on lisinopril 5 mg.  Most recent BP reading was 161, but has been out of medication for about 1 week. BP usually runs 130-140 while on medications.   Tobacco  Pt continues to smoke. He has tried Chantix in the past but states this medication caused homicidal ideation.   GERD He is needing refill of omeprazole. Pt reports developing symptoms of heartburn and ingestion when off medication for 3-5 days.   Hyperlipidemia Dr. [name] switched his medications from Lipitor 80 mg to Crestor 40mg  due to intolerance to Lipitor of muscle cramps. Pt has stopped taking Crestor 40mg  due to experiencing worse muscle cramps. Pt is willing to try another statin. He is on a low to no carb diet. He has been able to maintain a healthy energy level.    Pt is fasting.  Patient Active Problem List   Diagnosis Date Noted  . Bruit 10/23/2015  . Prediabetes 12/19/2014  . GERD (gastroesophageal reflux disease) 12/19/2014  . CLAUDICATION 09/04/2010  . TOBACCO ABUSE 08/15/2009  . Hyperlipidemia LDL goal <70 08/12/2009  . Essential hypertension 08/12/2009  . MYOCARDIAL INFARCTION  08/12/2009  . Coronary atherosclerosis 08/12/2009  . ISCHEMIC CARDIOMYOPATHY 08/12/2009  . 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 SYNCOPE 08/12/2009  . Insomnia 08/12/2009   Past Medical History  Diagnosis Date  . COPD (chronic obstructive pulmonary disease) (HCC)   . Myocardial infarction (HCC)   . Hypertension   . Asthma     occ bronchitis  . GERD (gastroesophageal reflux disease)   . Arthritis   . Seasonal allergies   . Coronary artery disease     a. s/p multiple prior PCIs; b. s/p CABG 06/2006 (L-LAD, S-Int and OM, S-PDA);  c. ETT-Myoview 4/13: low risk, no ischemia, EF 53%  . Hx of echocardiogram     Echo 10/2006: normal LVF  . Hyperlipidemia    Past Surgical History  Procedure Laterality Date  . Coronary artery bypass graft  2007  . Coronary stent placement      x5 prior to cabg  . Tonsillectomy    . Colonoscopy    . Cardiac catheterization    . Knee arthroscopy      rt knee  . Acne cyst removal    . Shoulder arthroscopy  12/14/2011    removed bone spurs and repositioned muscle.    Allergies  Allergen Reactions  . Codeine Nausea And Vomiting   Prior to Admission medications   Medication Sig Start Date End Date Taking? Authorizing Provider  albuterol (PROVENTIL HFA;VENTOLIN HFA) 108 (90 BASE) MCG/ACT inhaler Inhale 2 puffs  into the lungs every 4 (four) hours as needed for wheezing or shortness of breath (cough, shortness of breath or wheezing.). 08/27/14  Yes Stephanie D English, PA  amLODipine (NORVASC) 10 MG tablet TAKE 1 TABLET BY MOUTH DAILY. 02/20/16  Yes Sherren Mocha, MD  aspirin 81 MG tablet Take 81 mg by mouth daily.   Yes Historical Provider, MD  cetirizine (ZYRTEC) 10 MG tablet Take 10 mg by mouth daily.   Yes Historical Provider, MD  diphenhydramine-acetaminophen (TYLENOL PM EXTRA STRENGTH) 25-500 MG TABS Take 2 tablets by mouth at bedtime as needed.   Yes Historical Provider, MD  metoprolol succinate (TOPROL-XL) 50 MG 24 hr tablet TAKE 1 TABLET BY MOUTH DAILY. TAKE  WITH OR IMMEDIATELY FOLLOWING A MEAL. 02/20/16  Yes Sherren Mocha, MD  naproxen sodium (ANAPROX) 220 MG tablet Take 220 mg by mouth 2 (two) times daily with a meal. TAKE TWO TABS EVERY 12 HOURS FOR PAIN   Yes Historical Provider, MD  omeprazole (PRILOSEC) 20 MG capsule Take 1 capsule (20 mg total) by mouth daily. 12/19/14  Yes Sherren Mocha, MD  rosuvastatin (CRESTOR) 40 MG tablet Take 1 tablet (40 mg total) by mouth daily. Patient not taking: Reported on 02/21/2016 10/23/15   Lewayne Bunting, MD   Review of Systems  Constitutional: Negative for fever and chills.  Respiratory: Positive for cough. Negative for chest tightness, shortness of breath and wheezing.   Cardiovascular: Negative for chest pain, palpitations and leg swelling.  Musculoskeletal: Positive for myalgias.  Skin: Negative for rash.  Neurological: Negative for weakness and numbness.  Psychiatric/Behavioral: Negative for sleep disturbance.       Objective:   Physical Exam  Constitutional: He is oriented to person, place, and time. He appears well-developed and well-nourished. No distress.  HENT:  Head: Normocephalic and atraumatic.  Eyes: Conjunctivae and EOM are normal.  Neck: Neck supple.  Cardiovascular: Normal rate.   Pulmonary/Chest: Effort normal.  Musculoskeletal: Normal range of motion.  Neurological: He is alert and oriented to person, place, and time.  Skin: Skin is warm and dry.  Psychiatric: He has a normal mood and affect. His behavior is normal.  Nursing note and vitals reviewed.   Filed Vitals:   02/21/16 1046  BP: 150/80  Pulse: 66  Temp: 97.5 F (36.4 C)  TempSrc: Oral  Resp: 17  Height:  (1.753 m)  Weight: 214 lb (97.07 kg)  SpO2: 98%    Results for orders placed or performed in visit on 02/21/16  POCT glycosylated hemoglobin (Hb A1C)  Result Value Ref Range   Hemoglobin A1C 6.3     Assessment & Plan:   1. Gastroesophageal reflux disease, esophagitis presence not specified   2.  Prediabetes - worsening from 6.0-> 6.3.  Already on low carb diet from wife, try to increase exercise, consider nutritionist referral.  3. TOBACCO ABUSE - pt wants to quit but unsuccessful at every attempt he can think of, chantix made him want to kill people, pt frustrated  4. Hyperlipidemia LDL goal <70 - has been intolerance of statins - most recently lipitor and crestor, myalgias so severe that they keep him from sleep and from being active so not worth it - he is willing to try another - will try privastatin along with coenzyme q10  5. Essential hypertension - pt feels that his regimen is well-controlled and does not want to try changes at this time as he has always felt worse when meds have been switched.  He reports way to much symptomatic hypotension with lisinopril 5 recently but did suggest to pt that perhaps weaning off the amlodipine so he could tolerate the lisinopril might have additional benefits for him beyond bp control. Pt willing to cons this in the future, but for now will focus med change on trial of the new statin.  6. Atherosclerosis of native coronary artery of native heart without angina pectoris     Orders Placed This Encounter  Procedures  . POCT glycosylated hemoglobin (Hb A1C)    Meds ordered this encounter  Medications  . amLODipine (NORVASC) 10 MG tablet    Sig: Take 1 tablet (10 mg total) by mouth daily.    Dispense:  90 tablet    Refill:  3  . metoprolol succinate (TOPROL-XL) 50 MG 24 hr tablet    Sig: TAKE 1 TABLET BY MOUTH DAILY. TAKE WITH OR IMMEDIATELY FOLLOWING A MEAL.    Dispense:  90 tablet    Refill:  3  . omeprazole (PRILOSEC) 20 MG capsule    Sig: Take 1 capsule (20 mg total) by mouth daily.    Dispense:  30 capsule    Refill:  12  . Pitavastatin Calcium 1 MG TABS    Sig: Take 1 tablet (1 mg total) by mouth daily.    Dispense:  30 tablet    Refill:  5    Take With COENZYME Q10    I personally performed the services described in this  documentation, which was scribed in my presence. The recorded information has been reviewed and considered, and addended by me as needed.   Norberto Sorenson, M.D.  [facility] [address] [place], [place] [address] [phone] phone [phone] fax  02/21/2016 1:19 PM

## 2016-03-25 MED FILL — LIVALO 1 MG TABLET: 1 | 30 days supply | Qty: 30 | Fill #1

## 2016-04-27 MED FILL — OMEPRAZOLE DR 20 MG CAPSULE: 20 | 30 days supply | Qty: 30 | Fill #1

## 2016-04-27 MED FILL — LIVALO 1 MG TABLET: 1 | 30 days supply | Qty: 30 | Fill #2

## 2016-05-29 MED FILL — METOPROLOL SUCC ER 50 MG TA: 50 | 90 days supply | Qty: 90 | Fill #0

## 2016-05-29 MED FILL — OMEPRAZOLE DR 20 MG CAPSULE: 20 | 30 days supply | Qty: 30 | Fill #2

## 2016-05-29 MED FILL — LIVALO 1 MG TABLET: 1 | 30 days supply | Qty: 30 | Fill #3

## 2016-05-29 MED FILL — AMLODIPINE BESYLATE 10 MG T: 10 | 90 days supply | Qty: 90 | Fill #0

## 2016-06-02 ENCOUNTER — Other Ambulatory Visit: Payer: Self-pay | Admitting: *Deleted

## 2016-06-02 ENCOUNTER — Telehealth: Payer: Self-pay | Admitting: *Deleted

## 2016-06-02 DIAGNOSIS — I714 Abdominal aortic aneurysm, without rupture, unspecified: Secondary | ICD-10-CM

## 2016-06-02 DIAGNOSIS — I10 Essential (primary) hypertension: Secondary | ICD-10-CM

## 2016-06-02 NOTE — Telephone Encounter (Signed)
Spoke with pt, he is due a CTA of the abdomen to follow up on AAA. Pt agrees to schedule. Will place the order and send to admin for scheduling.

## 2016-06-04 ENCOUNTER — Other Ambulatory Visit: Payer: 59 | Admitting: *Deleted

## 2016-06-04 DIAGNOSIS — E785 Hyperlipidemia, unspecified: Secondary | ICD-10-CM | POA: Diagnosis not present

## 2016-06-04 DIAGNOSIS — I714 Abdominal aortic aneurysm, without rupture, unspecified: Secondary | ICD-10-CM

## 2016-06-04 DIAGNOSIS — I10 Essential (primary) hypertension: Secondary | ICD-10-CM | POA: Diagnosis not present

## 2016-06-04 LAB — BASIC METABOLIC PANEL
BUN: 15 mg/dL (ref 7–25)
CALCIUM: 8.9 mg/dL (ref 8.6–10.3)
CO2: 25 mmol/L (ref 20–31)
Chloride: 106 mmol/L (ref 98–110)
Creat: 0.82 mg/dL (ref 0.70–1.25)
GLUCOSE: 125 mg/dL — AB (ref 65–99)
POTASSIUM: 3.8 mmol/L (ref 3.5–5.3)
SODIUM: 138 mmol/L (ref 135–146)

## 2016-06-04 NOTE — Addendum Note (Signed)
Addended by: Tonita Phoenix on: 06/04/2016 11:31 AM   Modules accepted: Orders

## 2016-06-09 ENCOUNTER — Ambulatory Visit (INDEPENDENT_AMBULATORY_CARE_PROVIDER_SITE_OTHER)
Admission: RE | Admit: 2016-06-09 | Discharge: 2016-06-09 | Disposition: A | Discharge status: Home / Self Care | Payer: 59 | Source: Ambulatory Visit | Attending: Cardiology | Admitting: Cardiology

## 2016-06-09 DIAGNOSIS — I714 Abdominal aortic aneurysm, without rupture: Secondary | ICD-10-CM | POA: Diagnosis not present

## 2016-06-09 MED ORDER — IOPAMIDOL (ISOVUE-370) INJECTION 76%
100.0000 mL | Freq: Once | INTRAVENOUS | Status: AC | PRN
  Administered 2016-06-09: 100 mL via INTRAVENOUS

## 2016-07-07 MED FILL — LIVALO 1 MG TABLET: 1 | 30 days supply | Qty: 30 | Fill #4

## 2016-07-07 MED FILL — OMEPRAZOLE DR 20 MG CAPSULE: 20 | 30 days supply | Qty: 30 | Fill #3

## 2016-08-10 MED FILL — OMEPRAZOLE DR 20 MG CAPSULE: 20 | 30 days supply | Qty: 30 | Fill #4

## 2016-08-10 MED FILL — LIVALO 1 MG TABLET: 1 | 30 days supply | Qty: 30 | Fill #5

## 2016-09-08 DIAGNOSIS — H2513 Age-related nuclear cataract, bilateral: Secondary | ICD-10-CM | POA: Diagnosis not present

## 2016-09-08 DIAGNOSIS — H524 Presbyopia: Secondary | ICD-10-CM | POA: Diagnosis not present

## 2016-09-08 DIAGNOSIS — H5203 Hypermetropia, bilateral: Secondary | ICD-10-CM | POA: Diagnosis not present

## 2016-09-10 ENCOUNTER — Other Ambulatory Visit: Payer: Self-pay | Admitting: Family Medicine

## 2016-09-10 MED FILL — AMLODIPINE BESYLATE 10 MG T: 10 | 90 days supply | Qty: 90 | Fill #1

## 2016-09-10 MED FILL — METOPROLOL SUCC ER 50 MG TA: 50 | 90 days supply | Qty: 90 | Fill #1

## 2016-09-10 MED FILL — OMEPRAZOLE DR 20 MG CAPSULE: 20 | 30 days supply | Qty: 30 | Fill #5

## 2016-09-11 NOTE — Telephone Encounter (Signed)
SS refill req Livalo Last visit 02/2016- last A1c 02/2016 Refilled 30 days with note to make appt asap

## 2016-09-14 MED FILL — LIVALO 1 MG TABLET: 1 | 30 days supply | Qty: 30 | Fill #0

## 2016-10-13 MED FILL — OMEPRAZOLE DR 20 MG CAPSULE: 20 | 30 days supply | Qty: 30 | Fill #6

## 2016-11-02 ENCOUNTER — Ambulatory Visit (INDEPENDENT_AMBULATORY_CARE_PROVIDER_SITE_OTHER): Payer: 59 | Admitting: Family Medicine

## 2016-11-02 ENCOUNTER — Encounter: Payer: Self-pay | Admitting: Family Medicine

## 2016-11-02 ENCOUNTER — Telehealth: Payer: 59 | Admitting: Nurse Practitioner

## 2016-11-02 VITALS — BP 142/78 | HR 69 | Temp 98.3°F | Resp 16 | Ht 69.0 in | Wt 224.0 lb

## 2016-11-02 DIAGNOSIS — Z5181 Encounter for therapeutic drug level monitoring: Secondary | ICD-10-CM

## 2016-11-02 DIAGNOSIS — J209 Acute bronchitis, unspecified: Secondary | ICD-10-CM

## 2016-11-02 DIAGNOSIS — E785 Hyperlipidemia, unspecified: Secondary | ICD-10-CM | POA: Diagnosis not present

## 2016-11-02 DIAGNOSIS — R0602 Shortness of breath: Secondary | ICD-10-CM | POA: Diagnosis not present

## 2016-11-02 DIAGNOSIS — R059 Cough, unspecified: Secondary | ICD-10-CM

## 2016-11-02 DIAGNOSIS — J441 Chronic obstructive pulmonary disease with (acute) exacerbation: Secondary | ICD-10-CM

## 2016-11-02 DIAGNOSIS — R05 Cough: Secondary | ICD-10-CM | POA: Diagnosis not present

## 2016-11-02 MED ORDER — PREDNISONE 10 MG PO TABS: ORAL_TABLET | ORAL | 0 refills | Status: DC

## 2016-11-02 MED ORDER — LEVOFLOXACIN 500 MG PO TABS: 500.0000 mg | ORAL_TABLET | Freq: Every day | ORAL | 0 refills | Status: DC

## 2016-11-02 MED ORDER — ALBUTEROL SULFATE HFA 108 (90 BASE) MCG/ACT IN AERS: 2.0000 | INHALATION_SPRAY | RESPIRATORY_TRACT | 0 refills | Status: DC | PRN

## 2016-11-02 MED ORDER — HYDROCOD POLST-CPM POLST ER 10-8 MG/5ML PO SUER: 5.0000 mL | Freq: Two times a day (BID) | ORAL | 0 refills | Status: DC | PRN

## 2016-11-02 MED ORDER — PITAVASTATIN CALCIUM 1 MG PO TABS: 1.0000 mg | ORAL_TABLET | Freq: Every day | ORAL | 0 refills | Status: DC

## 2016-11-02 MED ORDER — BENZONATATE 100 MG PO CAPS
100.0000 mg | ORAL_CAPSULE | Freq: Three times a day (TID) | ORAL | 0 refills | Status: DC | PRN
Start: 2016-11-02 — End: 2016-11-02

## 2016-11-02 MED ORDER — DOXYCYCLINE HYCLATE 100 MG PO TABS: 100.0000 mg | ORAL_TABLET | Freq: Two times a day (BID) | ORAL | 0 refills | Status: DC

## 2016-11-02 MED FILL — levoFLOXacin 500 MG TABS: 500 | 7 days supply | Qty: 7 | Fill #0

## 2016-11-02 MED FILL — HYDROCODONE-CHLORPHEN ER SU: 10-8 | 12 days supply | Qty: 120 | Fill #0

## 2016-11-02 MED FILL — predniSONE 10 MG TABS: 10 | 6 days supply | Qty: 21 | Fill #0

## 2016-11-02 MED FILL — LIVALO 1 MG TABLET: 1 | 90 days supply | Qty: 90 | Fill #0

## 2016-11-02 MED FILL — VENTOLIN HFA 90 MCG INHALER: 108 (90 BAS | 25 days supply | Qty: 18 | Fill #0

## 2016-11-02 NOTE — Patient Instructions (Signed)

## 2016-11-02 NOTE — Progress Notes (Signed)
We are sorry that you are not feeling well.  Here is how we plan to help!  Based on what you have shared with me it looks like you have upper respiratory tract inflammation that has resulted in a significant cough.  Inflammation and infection in the upper respiratory tract is commonly called bronchitis and has four common causes:  Allergies, Viral Infections, Acid Reflux and Bacterial Infections.  Allergies, viruses and acid reflux are treated by controlling symptoms or eliminating the cause. An example might be a cough caused by taking certain blood pressure medications. You stop the cough by changing the medication. Another example might be a cough caused by acid reflux. Controlling the reflux helps control the cough.  Based on your presentation I believe you most likely have A cough due to bacteria.  When patients have a fever and a productive cough with a change in color or increased sputum production, we are concerned about bacterial bronchitis.  If left untreated it can progress to pneumonia.  If your symptoms do not improve with your treatment plan it is important that you contact your provider.   I hve prescribed Doxycycline 100 mg twice a day for 7 days     In addition you may use A prescription cough medication called Tessalon Perles 100mg. You may take 1-2 capsules every 8 hours as needed for your cough.    USE OF BRONCHODILATOR ("RESCUE") INHALERS: There is a risk from using your bronchodilator too frequently.  The risk is that over-reliance on a medication which only relaxes the muscles surrounding the breathing tubes can reduce the effectiveness of medications prescribed to reduce swelling and congestion of the tubes themselves.  Although you feel brief relief from the bronchodilator inhaler, your asthma may actually be worsening with the tubes becoming more swollen and filled with mucus.  This can delay other crucial treatments, such as oral steroid medications. If you need to use a  bronchodilator inhaler daily, several times per day, you should discuss this with your provider.  There are probably better treatments that could be used to keep your asthma under control.     HOME CARE . Only take medications as instructed by your medical team. . Complete the entire course of an antibiotic. . Drink plenty of fluids and get plenty of rest. . Avoid close contacts especially the very young and the elderly . Cover your mouth if you cough or cough into your sleeve. . Always remember to wash your hands . A steam or ultrasonic humidifier can help congestion.   GET HELP RIGHT AWAY IF: . You develop worsening fever. . You become short of breath . You cough up blood. . Your symptoms persist after you have completed your treatment plan MAKE SURE YOU   Understand these instructions.  Will watch your condition.  Will get help right away if you are not doing well or get worse.  Your e-visit answers were reviewed by a board certified advanced clinical practitioner to complete your personal care plan.  Depending on the condition, your plan could have included both over the counter or prescription medications. If there is a problem please reply  once you have received a response from your provider. Your safety is important to us.  If you have drug allergies check your prescription carefully.    You can use MyChart to ask questions about today's visit, request a non-urgent call back, or ask for a work or school excuse for 24 hours related to this e-Visit. If   it has been greater than 24 hours you will need to follow up with your provider, or enter a new e-Visit to address those concerns. You will get an e-mail in the next two days asking about your experience.  I hope that your e-visit has been valuable and will speed your recovery. Thank you for using e-visits.   

## 2016-11-02 NOTE — Progress Notes (Signed)
Subjective:    Patient ID: [name], male    DOB: 07/07/1951, 67 y.o.   MRN: [id number] Chief Complaint  Patient presents with  . Cough    x 1 wk  . Sinusitis    HPI  [name] is a 66 yo male who has been feeling ill for the past week.   Started with sinus sxs but has moved down to his chest as well.  Can hear himself wheeze at night. Hard to sleep due to cong.  Needs refills on his chronic meds.  Is not fasting today.  Depression screen [facility] 2/9 11/02/2016 02/21/2016 09/12/2015 02/22/2015 12/14/2014  Decreased Interest 0 0 0 0 0  Down, Depressed, Hopeless 0 0 0 0 0  PHQ - 2 Score 0 0 0 0 0   Past Medical History:  Diagnosis Date  . Arthritis   . Asthma    occ bronchitis  . COPD (chronic obstructive pulmonary disease) (HCC)   . Coronary artery disease    a. s/p multiple prior PCIs; b. s/p CABG 06/2006 (L-LAD, S-Int and OM, S-PDA);  c. ETT-Myoview 4/13: low risk, no ischemia, EF 53%  . GERD (gastroesophageal reflux disease)   . Hx of echocardiogram    Echo 10/2006: normal LVF  . Hyperlipidemia   . Hypertension   . Myocardial infarction   . Seasonal allergies    Past Surgical History:  Procedure Laterality Date  . ACNE CYST REMOVAL    . CARDIAC CATHETERIZATION    . COLONOSCOPY    . CORONARY ARTERY BYPASS GRAFT  2007  . CORONARY STENT PLACEMENT     x5 prior to cabg  . KNEE ARTHROSCOPY     rt knee  . SHOULDER ARTHROSCOPY  12/14/2011   removed bone spurs and repositioned muscle.   . TONSILLECTOMY     Current Outpatient Prescriptions on File Prior to Visit  Medication Sig Dispense Refill  . amLODipine (NORVASC) 10 MG tablet Take 1 tablet (10 mg total) by mouth daily. 90 tablet 3  . aspirin 81 MG tablet Take 81 mg by mouth daily.    . cetirizine (ZYRTEC) 10 MG tablet Take 10 mg by mouth daily.    . diphenhydramine-acetaminophen (TYLENOL PM EXTRA STRENGTH) 25-500 MG TABS Take 2 tablets by mouth at bedtime as needed.    . metoprolol succinate (TOPROL-XL) 50 MG 24 hr  tablet TAKE 1 TABLET BY MOUTH DAILY. TAKE WITH OR IMMEDIATELY FOLLOWING A MEAL. 90 tablet 3  . naproxen sodium (ANAPROX) 220 MG tablet Take 220 mg by mouth 2 (two) times daily with a meal. TAKE TWO TABS EVERY 12 HOURS FOR PAIN    . omeprazole (PRILOSEC) 20 MG capsule Take 1 capsule (20 mg total) by mouth daily. 30 capsule 12   No current facility-administered medications on file prior to visit.    Allergies  Allergen Reactions  . Codeine Nausea And Vomiting   Family History  Problem Relation Age of Onset  . Anesthesia problems Neg Hx   . Coronary artery disease Father     brothers, sisters  . Heart disease Father   . Diabetes Mother   . Heart disease Sister   . Heart disease Sister   . Heart disease Brother    Social History   Social History  . Marital status: Married    Spouse name: N/A  . Number of children: N/A  . Years of education: N/A   Occupational History  . RETAIL    Social History Main  Topics  . Smoking status: Current Every Day Smoker    Packs/day: 1.00    Types: Cigarettes  . Smokeless tobacco: Never Used  . Alcohol use 0.0 oz/week     Comment: occasional - BEER OR WINE  . Drug use: No  . Sexual activity: Not Asked   Other Topics Concern  . 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      Review of Systems  Constitutional: Positive for activity change, appetite change and fatigue. Negative for chills and fever.  HENT: Negative for congestion, ear discharge, ear pain, postnasal drip, rhinorrhea, sinus pressure, sneezing, sore throat and trouble swallowing.   Respiratory: Positive for cough, shortness of breath and wheezing.   Cardiovascular: Negative for chest pain.  Gastrointestinal: Negative for abdominal pain, constipation, diarrhea, nausea and vomiting.  Genitourinary: Negative for difficulty urinating and dysuria.  Musculoskeletal: Positive for arthralgias.  Negative for myalgias, neck pain and neck stiffness.  Hematological: Negative for adenopathy.  Psychiatric/Behavioral: Positive for sleep disturbance. Negative for dysphoric mood. The patient is not nervous/anxious.        Objective:   Physical Exam  Constitutional: He is oriented to person, place, and time. He appears well-developed and well-nourished. No distress.  HENT:  Head: Normocephalic and atraumatic.  Right Ear: External ear and ear canal normal. Tympanic membrane is retracted. A middle ear effusion is present.  Left Ear: External ear and ear canal normal. Tympanic membrane is retracted. A middle ear effusion is present.  Nose: Mucosal edema and rhinorrhea present. Right sinus exhibits maxillary sinus tenderness. Left sinus exhibits maxillary sinus tenderness.  Mouth/Throat: Uvula is midline and mucous membranes are normal. Posterior oropharyngeal erythema present. No oropharyngeal exudate or posterior oropharyngeal edema.  Eyes: Conjunctivae are normal. Right eye exhibits no discharge. Left eye exhibits no discharge. No scleral icterus.  Neck: Normal range of motion. Neck supple. No thyromegaly present.  Cardiovascular: Normal rate, regular rhythm, normal heart sounds and intact distal pulses.   Pulmonary/Chest: Effort normal and breath sounds normal. No respiratory distress.  Lymphadenopathy:       Head (right side): Submandibular adenopathy present.       Head (left side): Submandibular adenopathy present.    He has no cervical adenopathy.       Right: No supraclavicular adenopathy present.       Left: No supraclavicular adenopathy present.  Neurological: He is alert and oriented to person, place, and time.  Skin: Skin is warm and dry. He is not diaphoretic. No erythema.  Psychiatric: He has a normal mood and affect. His behavior is normal.    BP (!) 142/78   Pulse 69   Temp 98.3 F (36.8 C) (Oral)   Resp 16   Ht 5\' 9"  (1.753 m)   Wt 224 lb (101.6 kg)   SpO2 96%    BMI 33.08 kg/m      Assessment & Plan:   1. Acute bronchitis, unspecified organism   2. Hyperlipidemia, unspecified hyperlipidemia type   3. Medication monitoring encounter   4. SOB (shortness of breath)   5. COPD exacerbation (HCC)     Orders Placed This Encounter  Procedures  . Comprehensive metabolic panel    Standing Status:   Future    Standing Expiration Date:   11/02/2017    Order Specific Question:   Has the patient fasted?    Answer:   Yes  . Lipid  panel    Standing Status:   Future    Standing Expiration Date:   11/02/2017    Order Specific Question:   Has the patient fasted?    Answer:   Yes    Meds ordered this encounter  Medications  . levofloxacin (LEVAQUIN) 500 MG tablet    Sig: Take 1 tablet (500 mg total) by mouth daily.    Dispense:  7 tablet    Refill:  0  . chlorpheniramine-HYDROcodone (TUSSIONEX PENNKINETIC ER) 10-8 MG/5ML SUER    Sig: Take 5 mLs by mouth every 12 (twelve) hours as needed.    Dispense:  120 mL    Refill:  0  . Pitavastatin Calcium (LIVALO) 1 MG TABS    Sig: Take 1 tablet (1 mg total) by mouth daily.    Dispense:  90 tablet    Refill:  0  . predniSONE (DELTASONE) 10 MG tablet    Sig: 6-5-4-3-2-1 tabs po qd    Dispense:  21 tablet    Refill:  0  . albuterol (PROVENTIL HFA;VENTOLIN HFA) 108 (90 Base) MCG/ACT inhaler    Sig: Inhale 2 puffs into the lungs every 4 (four) hours as needed for wheezing or shortness of breath (cough, shortness of breath or wheezing.).    Dispense:  1 Inhaler    Refill:  0     Norberto Sorenson, M.D.  Primary Care at [facility] [address] [place], [place] [address] [phone] phone [phone] fax  12/04/16 2:16 AM

## 2016-11-13 MED FILL — OMEPRAZOLE DR 20 MG CAPSULE: 20 | 30 days supply | Qty: 30 | Fill #7

## 2016-12-17 ENCOUNTER — Other Ambulatory Visit: Payer: 59

## 2016-12-17 DIAGNOSIS — E785 Hyperlipidemia, unspecified: Secondary | ICD-10-CM | POA: Diagnosis not present

## 2016-12-17 DIAGNOSIS — Z5181 Encounter for therapeutic drug level monitoring: Secondary | ICD-10-CM

## 2016-12-18 LAB — COMPREHENSIVE METABOLIC PANEL
A/G RATIO: 1.3 (ref 1.2–2.2)
ALT: 22 IU/L (ref 0–44)
AST: 17 IU/L (ref 0–40)
Albumin: 4.1 g/dL (ref 3.6–4.8)
Alkaline Phosphatase: 120 IU/L — ABNORMAL HIGH (ref 39–117)
BUN/Creatinine Ratio: 18 (ref 10–24)
BUN: 22 mg/dL (ref 8–27)
Bilirubin Total: 0.3 mg/dL (ref 0.0–1.2)
CO2: 26 mmol/L (ref 18–29)
Calcium: 9.8 mg/dL (ref 8.6–10.2)
Chloride: 100 mmol/L (ref 96–106)
Creatinine, Ser: 1.22 mg/dL (ref 0.76–1.27)
GFR calc non Af Amer: 62 mL/min/{1.73_m2} (ref >59)
GFR, EST AFRICAN AMERICAN: 72 mL/min/{1.73_m2} (ref >59)
GLOBULIN, TOTAL: 3.1 g/dL (ref 1.5–4.5)
Glucose: 137 mg/dL — ABNORMAL HIGH (ref 65–99)
POTASSIUM: 5.1 mmol/L (ref 3.5–5.2)
SODIUM: 142 mmol/L (ref 134–144)
TOTAL PROTEIN: 7.2 g/dL (ref 6.0–8.5)

## 2016-12-18 LAB — LIPID PANEL
CHOL/HDL RATIO: 7.3 ratio — AB (ref 0.0–5.0)
Cholesterol, Total: 263 mg/dL — ABNORMAL HIGH (ref 100–199)
HDL: 36 mg/dL — ABNORMAL LOW (ref >39)
LDL Calculated: 147 mg/dL — ABNORMAL HIGH (ref 0–99)
Triglycerides: 400 mg/dL — ABNORMAL HIGH (ref 0–149)
VLDL Cholesterol Cal: 80 mg/dL — ABNORMAL HIGH (ref 5–40)

## 2016-12-21 MED FILL — OMEPRAZOLE DR 20 MG CAPSULE: 20 | 30 days supply | Qty: 30 | Fill #8

## 2016-12-21 MED FILL — METOPROLOL SUCC ER 50 MG TA: 50 | 90 days supply | Qty: 90 | Fill #2

## 2016-12-21 MED FILL — AMLODIPINE BESYLATE 10 MG T: 10 | 90 days supply | Qty: 90 | Fill #2

## 2017-01-28 MED FILL — OMEPRAZOLE DR 20 MG CAPSULE: 20 | 30 days supply | Qty: 30 | Fill #9

## 2017-02-18 ENCOUNTER — Other Ambulatory Visit: Payer: Self-pay | Admitting: Family Medicine

## 2017-02-18 MED FILL — LIVALO 1 MG TABLET: 1 | 90 days supply | Qty: 90 | Fill #0

## 2017-03-19 ENCOUNTER — Other Ambulatory Visit: Payer: Self-pay | Admitting: Family Medicine

## 2017-03-19 ENCOUNTER — Telehealth: Payer: Self-pay | Admitting: Family Medicine

## 2017-03-19 DIAGNOSIS — K219 Gastro-esophageal reflux disease without esophagitis: Secondary | ICD-10-CM

## 2017-03-19 NOTE — Telephone Encounter (Signed)
Pt came in requesting medication refills because he says they are out of date & wants to be seen the 25th of this month because he will be leaving out of town until Nov. He will call to schedule on the 18th but needs something to last until then.   Please Advise

## 2017-03-19 NOTE — Telephone Encounter (Signed)
Sent in a 30d supply on meds.

## 2017-03-19 NOTE — Telephone Encounter (Signed)
Please advise 

## 2017-03-25 ENCOUNTER — Encounter: Payer: Self-pay | Admitting: Internal Medicine

## 2017-03-31 ENCOUNTER — Ambulatory Visit (INDEPENDENT_AMBULATORY_CARE_PROVIDER_SITE_OTHER): Payer: 59 | Admitting: Family Medicine

## 2017-03-31 ENCOUNTER — Encounter: Payer: Self-pay | Admitting: Family Medicine

## 2017-03-31 VITALS — BP 176/76 | HR 58 | Temp 97.7°F | Resp 18 | Ht 69.0 in | Wt 226.6 lb

## 2017-03-31 DIAGNOSIS — R7303 Prediabetes: Secondary | ICD-10-CM | POA: Diagnosis not present

## 2017-03-31 DIAGNOSIS — F17200 Nicotine dependence, unspecified, uncomplicated: Secondary | ICD-10-CM | POA: Diagnosis not present

## 2017-03-31 DIAGNOSIS — I2510 Atherosclerotic heart disease of native coronary artery without angina pectoris: Secondary | ICD-10-CM

## 2017-03-31 DIAGNOSIS — E785 Hyperlipidemia, unspecified: Secondary | ICD-10-CM

## 2017-03-31 DIAGNOSIS — M5410 Radiculopathy, site unspecified: Secondary | ICD-10-CM | POA: Diagnosis not present

## 2017-03-31 DIAGNOSIS — I10 Essential (primary) hypertension: Secondary | ICD-10-CM | POA: Diagnosis not present

## 2017-03-31 DIAGNOSIS — I739 Peripheral vascular disease, unspecified: Secondary | ICD-10-CM

## 2017-03-31 DIAGNOSIS — E119 Type 2 diabetes mellitus without complications: Secondary | ICD-10-CM

## 2017-03-31 LAB — POCT URINALYSIS DIP (MANUAL ENTRY)
Bilirubin, UA: NEGATIVE
GLUCOSE UA: NEGATIVE mg/dL
Ketones, POC UA: NEGATIVE mg/dL
Leukocytes, UA: NEGATIVE
NITRITE UA: NEGATIVE
Protein Ur, POC: NEGATIVE mg/dL
Spec Grav, UA: 1.015 (ref 1.010–1.025)
UROBILINOGEN UA: 0.2 U/dL
pH, UA: 7 (ref 5.0–8.0)

## 2017-03-31 LAB — POCT GLYCOSYLATED HEMOGLOBIN (HGB A1C): Hemoglobin A1C: 6.8

## 2017-03-31 MED ORDER — CILOSTAZOL 100 MG PO TABS: 100.0000 mg | ORAL_TABLET | Freq: Two times a day (BID) | ORAL | 0 refills | Status: DC

## 2017-03-31 MED ORDER — BLOOD GLUCOSE MONITOR KIT: PACK | 0 refills | Status: DC

## 2017-03-31 MED ORDER — LISINOPRIL 5 MG PO TABS: 5.0000 mg | ORAL_TABLET | Freq: Every day | ORAL | 0 refills | Status: DC

## 2017-03-31 MED FILL — LISINOPRIL 5 MG TABLET: 5 | 90 days supply | Qty: 90 | Fill #0

## 2017-03-31 MED FILL — AMLODIPINE BESYLATE 10 MG T: 10 | 90 days supply | Qty: 90 | Fill #0

## 2017-03-31 MED FILL — FREESTYLE LITE TEST STRIP: 25 days supply | Qty: 100 | Fill #0

## 2017-03-31 MED FILL — FREESTYLE LITE METER: 30 days supply | Qty: 1 | Fill #0

## 2017-03-31 MED FILL — OMEPRAZOLE DR 20 MG CAPSULE: 20 | 30 days supply | Qty: 30 | Fill #0

## 2017-03-31 MED FILL — METOPROLOL SUCC ER 50 MG TA: 50 | 30 days supply | Qty: 30 | Fill #0

## 2017-03-31 MED FILL — FREESTYLE LANCETS: 25 days supply | Qty: 100 | Fill #0

## 2017-03-31 MED FILL — CILOSTAZOL 100 MG TABLET: 100 | 30 days supply | Qty: 60 | Fill #0

## 2017-03-31 NOTE — Telephone Encounter (Signed)
Please call patient and let him know that I would like him to restart the lisinopril 5 mg that he was on last year. He had discontinued this as he felt it was making his blood pressure too low. However in office visits here his blood pressure has continued to increase and was much too high today. This blood pressure medicine is going to help protect his heart and his kidneys especially now that he has high blood sugar with diabetes and is also indicated for anyone who has had a heart attack to be on, even if the blood pressure is well controlled. I have sent a prescription for this to his pharmacy.

## 2017-03-31 NOTE — Telephone Encounter (Signed)
Pt given detailed message to restart Lisinopril 5 mg tab for elevated BP. Verbalized understanding Asking about a Medical leave form. Is it completed? And, can we send completed form to his My Chart?  Please advise

## 2017-03-31 NOTE — Telephone Encounter (Signed)
Ok I did not get any forms for this patient and he was last seen for an OV in Feb 2018 so they have probably already been sent to the scan center and there isn't anything we can do until they get around to scanning them into his chart.  Lowella Bandy do you know anything about these forms?

## 2017-03-31 NOTE — Progress Notes (Signed)
By signing my name below, I, Mesha Guinyard, attest that this documentation has been prepared under the direction and in the presence of Delman Cheadle, MD.  Electronically Signed: Verlee Monte, Medical Scribe. 03/31/17. 11:56 AM.  Subjective:    Patient ID: [name], male    DOB: Jan 27, 1951, 66 y.o.   MRN: [id number]  HPI  Chief Complaint  Patient presents with  . Leg Pain    bilaterally when pt walks mostly distance walking. Sometimes tingling and very painful     HPI Comments: [name] is a 66 y.o. male who presents to Primary Care at [facility] complaining of bilateral leg pain and medication refill. He was last seen 5 months prior for acute bronchitis. He requested med refill at that time, but was not fasting, so returned 2 months later for lab only visit. He's on livalo 1 mg daily but his lipid panel had got dramatically worse with LDL increase from 79 to 147 and non HDL from 134 to 227. His fasting blood sugar had also returned high at 137. He did have a history of preDM with A1c increase from 6 to 6.3 one year prior. He was asked to come in today as he called in to say he would be out of town for the next 4 months and is in need of medication refills.   DM: Him and his wife eats the same things, but they're not currently cooking the same thing or eating together. She's trying the keto diet.  Leg Pain: It starts from the knees and radiates down, bilaterally, after walking any amount of distance. He also reports associated sxs of tingling in his feet. His sxs have been worsening, states it hard to describes it as painful (not a burn), and it "hurts like hell". It takes 5 mins for relief of pain after sitting down and he can't do that while working at [employer]. Ever since he had his heart bypass where they took muscles and vessels, he's had tingling in his calfs. Yesterday, he noticed his left arm started to hurt after sweeping which is unusual to him. He occasionally has foot  swelling at the end of the day but not all the time and he'll intermittently wear hose stockings for relief of his sxs. He does have back pain, but attributes it to injured back and he takes tylenol for relief. His foot pain prevents him from exercising. Pt hasn't seen Dr. Stanford Breed in 1.5 years, but he plans on going to appt in Oct 2018. Pt quit smoking 03/22/17 after seeing a hypnotist. He'll occasionally want a cigarette, but he doesn't cave it. Denies radiating pain in his left shoulder.  Rash: He's had a rash on his lower extremity for a while and some of it has improved.  FMLA: Pt likes working at [employer], but he can't work with his leg pain. He's called out for the past 2 weeks and he only works 1 day a week.  Patient Active Problem List   Diagnosis Date Noted  . Bruit 10/23/2015  . Prediabetes 12/19/2014  . GERD (gastroesophageal reflux disease) 12/19/2014  . CLAUDICATION 09/04/2010  . TOBACCO ABUSE 08/15/2009  . Hyperlipidemia LDL goal <70 08/12/2009  . Essential hypertension 08/12/2009  . MYOCARDIAL INFARCTION 08/12/2009  . Coronary atherosclerosis 08/12/2009  . ISCHEMIC CARDIOMYOPATHY 08/12/2009  . 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 SYNCOPE 08/12/2009  . Insomnia 08/12/2009   Past Medical History:  Diagnosis Date  . Arthritis   . Asthma  occ bronchitis  . COPD (chronic obstructive pulmonary disease) ([place])   . Coronary artery disease    a. s/p multiple prior PCIs; b. s/p CABG 06/2006 (L-LAD, S-Int and OM, S-PDA);  c. ETT-Myoview 4/13: low risk, no ischemia, EF 53%  . GERD (gastroesophageal reflux disease)   . Hx of echocardiogram    Echo 10/2006: normal LVF  . Hyperlipidemia   . Hypertension   . Myocardial infarction ([place])   . Seasonal allergies    Past Surgical History:  Procedure Laterality Date  . ACNE CYST REMOVAL    . CARDIAC CATHETERIZATION    . COLONOSCOPY    . CORONARY ARTERY BYPASS GRAFT  2007  . CORONARY STENT PLACEMENT     x5 prior to cabg  . KNEE  ARTHROSCOPY     rt knee  . SHOULDER ARTHROSCOPY  12/14/2011   removed bone spurs and repositioned muscle.   . TONSILLECTOMY     Allergies  Allergen Reactions  . Codeine Nausea And Vomiting   Prior to Admission medications   Medication Sig Start Date End Date Taking? Authorizing Provider  albuterol (PROVENTIL HFA;VENTOLIN HFA) 108 (90 Base) MCG/ACT inhaler Inhale 2 puffs into the lungs every 4 (four) hours as needed for wheezing or shortness of breath (cough, shortness of breath or wheezing.). 11/02/16  Yes Shawnee Knapp, MD  amLODipine (NORVASC) 10 MG tablet TAKE 1 TABLET (10 MG TOTAL) BY MOUTH DAILY. 03/19/17  Yes Shawnee Knapp, MD  aspirin 81 MG tablet Take 81 mg by mouth daily.   Yes [provider]  Bilberry 1000 MG CAPS Take by mouth.   Yes [provider]  cetirizine (ZYRTEC) 10 MG tablet Take 10 mg by mouth daily.   Yes [provider]  co-enzyme Q-10 30 MG capsule Take 30 mg by mouth 3 (three) times daily.   Yes [provider]  diphenhydramine-acetaminophen (TYLENOL PM EXTRA STRENGTH) 25-500 MG TABS Take 2 tablets by mouth at bedtime as needed.   Yes [provider]  LIVALO 1 MG TABS TAKE 1 TABLET BY MOUTH DAILY. 02/18/17  Yes Shawnee Knapp, MD  magnesium oxide (MAG-OX) 400 MG tablet Take 400 mg by mouth daily.   Yes [provider]  metoprolol succinate (TOPROL-XL) 50 MG 24 hr tablet TAKE 1 TABLET BY MOUTH DAILY. TAKE WITH OR IMMEDIATELY FOLLOWING A MEAL. 03/19/17  Yes Shawnee Knapp, MD  Misc Natural Products (LUTEIN 20 PO) Take by mouth.   Yes [provider]  Multiple Vitamins-Minerals (MULTIVITAMIN ADULTS PO) Take by mouth.   Yes [provider]  naproxen sodium (ANAPROX) 220 MG tablet Take 220 mg by mouth 2 (two) times daily with a meal. TAKE TWO TABS EVERY 12 HOURS FOR PAIN   Yes [provider]  omeprazole (PRILOSEC) 20 MG capsule TAKE 1 CAPSULE (20 MG TOTAL) BY MOUTH DAILY. 03/19/17  Yes Shawnee Knapp, MD  OVER  THE COUNTER MEDICATION 500 mg.   Yes [provider]   Social History   Social History  . Marital status: Married    Spouse name: N/A  . Number of children: N/A  . Years of education: N/A   Occupational History  . RETAIL    Social History Main Topics  . Smoking status: Former Smoker    Packs/day: 1.00    Types: Cigarettes    Quit date: 03/22/2017  . Smokeless tobacco: Never Used  . Alcohol use 0.0 oz/week     Comment: occasional - BEER OR  WINE  . Drug use: No  . Sexual activity: Not on file   Other Topics Concern  . 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    Review of Systems  Musculoskeletal: Positive for back pain and myalgias.  Skin: Positive for color change and rash.   Objective:  Physical Exam  Constitutional: He appears well-developed and well-nourished. No distress.  HENT:  Head: Normocephalic and atraumatic.  Eyes: Conjunctivae are normal.  Neck: Neck supple.  Cardiovascular: Normal rate, regular rhythm and normal heart sounds.  Exam reveals no gallop and no friction rub.   No murmur heard. Pulses:      Dorsalis pedis pulses are 1+ on the right side, and 1+ on the left side.  Pulmonary/Chest: Effort normal and breath sounds normal. No respiratory distress. He has no wheezes. He has no rales.  Musculoskeletal: He exhibits edema (Trace lower extremity).  Neurological: He is alert.  Skin: Skin is warm and dry.  Loss of hair over the anterior shin with small amount hyperpigmented rash on the anterior  Psychiatric: He has a normal mood and affect. His behavior is normal.  Nursing note and vitals reviewed.    Diabetic Foot Exam - Simple   Simple Foot Form Visual Inspection No deformities, no ulcerations, no other skin breakdown bilaterally:  Yes Sensation Testing Intact to touch and monofilament testing bilaterally:  Yes Pulse Check Posterior Tibialis  and Dorsalis pulse intact bilaterally:  Yes Comments     Vitals:   03/31/17 1113  BP: (!) 176/76  Pulse: (!) 58  Resp: 18  Temp: 97.7 F (36.5 C)  TempSrc: Oral  SpO2: 97%  Weight: 226 lb 9.6 oz (102.8 kg)  Height: '5\' 9"'$  (1.753 m)   Body mass index is 33.46 kg/m.   Results for orders placed or performed in visit on 03/31/17  POCT glycosylated hemoglobin (Hb A1C)  Result Value Ref Range   Hemoglobin A1C 6.8    Assessment & Plan:   1. Essential hypertension - pt w/ hypotension on lisinopril prior but needs to restart, cont amlodipine 10  2. Atherosclerosis of native coronary artery of native heart without angina pectoris - cont asa 81  3. Hyperlipidemia LDL goal <70 - far from goal - restarted livalo last mo.   4. TOBACCO ABUSE   5. Type 2 diabetes mellitus without complication, without long-term current use of insulin ([place])   6. Bilateral radiating leg pain   7. CLAUDICATION - can't work due to severity - can't walk the length of [employer] even. Check abi and refer to vascular. fmla forms completed - out through mid Nov in case he needs surg. Start short-term trial of pletal.    Orders Placed This Encounter  Procedures  . Comprehensive metabolic panel    Order Specific Question:   Has the patient fasted?    Answer:   Yes  . CBC  . Microalbumin/Creatinine Ratio, Urine  . Lipid panel  . Ambulatory referral to Nutrition and Diabetic Education    Referral Priority:   Routine    Referral Type:   Consultation    Referral Reason:   Specialty Services Required    Number of Visits Requested:   1  . Ambulatory referral to Vascular Surgery    Referral Priority:   Routine    Referral Type:   Surgical    Referral Reason:   Specialty Services  Required    Requested Specialty:   Vascular Surgery    Number of Visits Requested:   1  . POCT glycosylated hemoglobin (Hb A1C)  . POCT urinalysis dipstick  . HM DIABETES FOOT EXAM    Meds ordered this encounter  Medications  .  Multiple Vitamins-Minerals (MULTIVITAMIN ADULTS PO)    Sig: Take by mouth.  . magnesium oxide (MAG-OX) 400 MG tablet    Sig: Take 400 mg by mouth daily.  . Misc Natural Products (LUTEIN 20 PO)    Sig: Take by mouth.  Marland Kitchen OVER THE COUNTER MEDICATION    Sig: 500 mg.  . Bilberry 1000 MG CAPS    Sig: Take by mouth.  . co-enzyme Q-10 30 MG capsule    Sig: Take 30 mg by mouth 3 (three) times daily.  . cilostazol (PLETAL) 100 MG tablet    Sig: Take 1 tablet (100 mg total) by mouth 2 (two) times daily. Take 30  Minutes before or 2 hours after food    Dispense:  60 tablet    Refill:  0  . blood glucose meter kit and supplies KIT    Sig: Dispense based on patient and insurance preference. Use up to four times daily as directed. (FOR ICD-9 250.00, 250.01).    Dispense:  1 each    Refill:  0    Order Specific Question:   Number of strips    Answer:   1000    Order Specific Question:   Number of lancets    Answer:   1000  . lisinopril (PRINIVIL,ZESTRIL) 5 MG tablet    Sig: Take 1 tablet (5 mg total) by mouth daily.    Dispense:  90 tablet    Refill:  0    I personally performed the services described in this documentation, which was scribed in my presence. The recorded information has been reviewed and considered, and addended by me as needed.   Delman Cheadle, M.D.  Primary Care at [facility] [address] [place], [place] [address] [phone] phone [phone] fax  04/02/17 10:28 PM

## 2017-03-31 NOTE — Telephone Encounter (Signed)
Yes, the FMLA form was completed and placed in the FMLA/disability box in the 102 front office

## 2017-03-31 NOTE — Telephone Encounter (Signed)
Lowella Bandy just gave them to me I will get them scanned into his chart tomorrow 04/01/17.

## 2017-03-31 NOTE — Telephone Encounter (Signed)
Sorry correction he was seen on 03/31/17

## 2017-03-31 NOTE — Telephone Encounter (Signed)
I completed them during the office visit but them put them in the fmla box at 102 so they could be processed.  Given to [place] to give to [place]

## 2017-03-31 NOTE — Patient Instructions (Addendum)
IF you received an x-ray today, you will receive an invoice from [facility]. Please contact [facility] at [phone] with questions or concerns regarding your invoice.   IF you received labwork today, you will receive an invoice from [facility]. Please contact [facility] at [phone] with questions or concerns regarding your invoice.   Our billing staff will not be able to assist you with questions regarding bills from these companies.  You will be contacted with the lab results as soon as they are available. The fastest way to get your results is to activate your My Chart account. Instructions are located on the last page of this paperwork. If you have not heard from [place] regarding the results in 2 weeks, please contact this office.      Intermittent Claudication Intermittent claudication is pain in your leg that occurs when you walk or exercise and goes away when you rest. The pain can occur in one or both legs. What are the causes? Intermittent claudication is caused by the buildup of plaque within the major arteries in the body (atherosclerosis). The plaque, which makes arteries stiff and narrow, prevents enough blood from reaching your leg muscles. The pain occurs when you walk or exercise because your muscles need more blood when you are moving and exercising. What increases the risk? Risk factors include:  A family history of atherosclerosis.  A personal history of stroke or heart disease.  Older age.  Being inactive or overweight.  Smoking cigarettes.  Having another health condition such as: ? Diabetes. ? High blood pressure. ? High cholesterol.  What are the signs or symptoms? Your hip or leg may:  Ache.  Cramp.  Feel tight.  Feel weak.  Feel heavy.  Over time, you may feel pain in your calf, thigh, or hip. How is this diagnosed? Your health care provider may diagnose intermittent claudication based on your symptoms and medical  history. Your health care provider may also do tests to learn more about your condition. These may include:  Blood tests.  An ultrasound.  Imaging tests such as angiography, magnetic resonance angiography (MRA), and computed tomography angiography (CTA).  How is this treated? You may be treated for problems such as:  High blood pressure.  High cholesterol.  Diabetes.  Other treatments may include:  Lifestyle changes such as: ? Starting an exercise program. ? Losing weight. ? Quitting smoking.  Medicines to help restore blood flow through your legs.  Blood vessel surgery (angioplasty) to restore blood flow if your intermittent claudication is caused by severe peripheral artery disease.  Follow these instructions at home:  Manage any other health conditions you have.  Eat a diet low in saturated fats and calories to maintain a healthy weight.  Quit smoking, if you smoke.  Take medicines only as directed by your health care provider.  If your health care provider recommended an exercise program for you, follow it as directed. Your exercise program may involve: ? Walking three or more times a week. ? Walking until you have certain symptoms of intermittent claudication. ? Resting until symptoms go away. ? Gradually increasing walking time to about 50 minutes a day. Contact a health care provider if: Your condition is not getting better or is getting worse. Get help right away if:  You have chest pain.  You have difficulty breathing.  You develop arm weakness.  You have trouble speaking.  Your face begins to droop. This information is not intended to replace advice given  to you by your health care provider. Make sure you discuss any questions you have with your health care provider. Document Released: 06/26/2004 Document Revised: 01/30/2016 Document Reviewed: 11/30/2013 Elsevier Interactive Patient Education  2017 [employer] Inc[employer]  Peripheral Vascular  Disease Peripheral vascular disease (PVD) is a disease of the blood vessels that are not part of your heart and brain. A simple term for PVD is poor circulation. In most cases, PVD narrows the blood vessels that carry blood from your heart to the rest of your body. This can result in a decreased supply of blood to your arms, legs, and internal organs, like your stomach or kidneys. However, it most often affects a person's lower legs and feet. There are two types of PVD.  Organic PVD. This is the more common type. It is caused by damage to the structure of blood vessels.  Functional PVD. This is caused by conditions that make blood vessels contract and tighten (spasm).  Without treatment, PVD tends to get worse over time. PVD can also lead to acute ischemic limb. This is when an arm or limb suddenly has trouble getting enough blood. This is a medical emergency. What are the causes? Each type of PVD has many different causes. The most common cause of PVD is buildup of a fatty material (plaque) inside of your arteries (atherosclerosis). Small amounts of plaque can break off from the walls of the blood vessels and become lodged in a smaller artery. This blocks blood flow and can cause acute ischemic limb. Other common causes of PVD include:  Blood clots that form inside of blood vessels.  Injuries to blood vessels.  Diseases that cause inflammation of blood vessels or cause blood vessel spasms.  Health behaviors and health history that increase your risk of developing PVD.  What increases the risk? You may have a greater risk of PVD if you:  Have a family history of PVD.  Have certain medical conditions, including: ? High cholesterol. ? Diabetes. ? High blood pressure (hypertension). ? Coronary heart disease. ? Past problems with blood clots. ? Past injury, such as burns or a broken bone. These may have damaged blood vessels in your limbs. ? Buerger disease. This is caused by inflamed  blood vessels in your hands and feet. ? Some forms of arthritis. ? Rare birth defects that affect the arteries in your legs.  Use tobacco.  Do not get enough exercise.  Are obese.  Are age 65 or older.  What are the signs or symptoms? PVD may cause many different symptoms. Your symptoms depend on what part of your body is not getting enough blood. Some common signs and symptoms include:  Cramps in your lower legs. This may be a symptom of poor leg circulation (claudication).  Pain and weakness in your legs while you are physically active that goes away when you rest (intermittent claudication).  Leg pain when at rest.  Leg numbness, tingling, or weakness.  Coldness in a leg or foot, especially when compared with the other leg.  Skin or hair changes. These can include: ? Hair loss. ? Shiny skin. ? Pale or bluish skin. ? Thick toenails.  Inability to get or maintain an erection (erectile dysfunction).  People with PVD are more prone to developing ulcers and sores on their toes, feet, or legs. These may take longer than normal to heal. How is this diagnosed? Your health care provider may diagnose PVD from your signs and symptoms. The health care provider will also do  a physical exam. You may have tests to find out what is causing your PVD and determine its severity. Tests may include:  Blood pressure recordings from your arms and legs and measurements of the strength of your pulses (pulse volume recordings).  Imaging studies using sound waves to take pictures of the blood flow through your blood vessels (Doppler ultrasound).  Injecting a dye into your blood vessels before having imaging studies using: ? X-rays (angiogram or arteriogram). ? Computer-generated X-rays (CT angiogram). ? A powerful electromagnetic field and a computer (magnetic resonance angiogram or MRA).  How is this treated? Treatment for PVD depends on the cause of your condition and the severity of your  symptoms. It also depends on your age. Underlying causes need to be treated and controlled. These include long-lasting (chronic) conditions, such as diabetes, high cholesterol, and high blood pressure. You may need to first try making lifestyle changes and taking medicines. Surgery may be needed if these do not work. Lifestyle changes may include:  Quitting smoking.  Exercising regularly.  Following a low-fat, low-cholesterol diet.  Medicines may include:  Blood thinners to prevent blood clots.  Medicines to improve blood flow.  Medicines to improve your blood cholesterol levels.  Surgical procedures may include:  A procedure that uses an inflated balloon to open a blocked artery and improve blood flow (angioplasty).  A procedure to put in a tube (stent) to keep a blocked artery open (stent implant).  Surgery to reroute blood flow around a blocked artery (peripheral bypass surgery).  Surgery to remove dead tissue from an infected wound on the affected limb.  Amputation. This is surgical removal of the affected limb. This may be necessary in cases of acute ischemic limb that are not improved through medical or surgical treatments.  Follow these instructions at home:  Take medicines only as directed by your health care provider.  Do not use any tobacco products, including cigarettes, chewing tobacco, or electronic cigarettes. If you need help quitting, ask your health care provider.  Lose weight if you are overweight, and maintain a healthy weight as directed by your health care provider.  Eat a diet that is low in fat and cholesterol. If you need help, ask your health care provider.  Exercise regularly. Ask your health care provider to suggest some good activities for you.  Use compression stockings or other mechanical devices as directed by your health care provider.  Take good care of your feet. ? Wear comfortable shoes that fit well. ? Check your feet often for any cuts  or sores. Contact a health care provider if:  You have cramps in your legs while walking.  You have leg pain when you are at rest.  You have coldness in a leg or foot.  Your skin changes.  You have erectile dysfunction.  You have cuts or sores on your feet that are not healing. Get help right away if:  Your arm or leg turns cold and blue.  Your arms or legs become red, warm, swollen, painful, or numb.  You have chest pain or trouble breathing.  You suddenly have weakness in your face, arm, or leg.  You become very confused or lose the ability to speak.  You suddenly have a very bad headache or lose your vision. This information is not intended to replace advice given to you by your health care provider. Make sure you discuss any questions you have with your health care provider. Document Released: 10/01/2004 Document Revised: 01/30/2016 Document  Reviewed: 02/01/2014 Elsevier Interactive Patient Education  2017 [employer].

## 2017-03-31 NOTE — Telephone Encounter (Signed)
There is no FMLA forms on file for this patient if they have been completed I did not get a copy of them so they may have been sent to the scan center I am not sure I will ask medical records but depending on how long ago it was if they are already at scan center it could take 3-4 months to get them  Upload into his chart.  Med records have you seen any forms for this patient?

## 2017-04-01 ENCOUNTER — Ambulatory Visit (HOSPITAL_COMMUNITY)
Admission: RE | Admit: 2017-04-01 | Discharge: 2017-04-01 | Disposition: A | Discharge status: Home / Self Care | Payer: 59 | Source: Ambulatory Visit | Attending: Vascular Surgery | Admitting: Vascular Surgery

## 2017-04-01 DIAGNOSIS — I739 Peripheral vascular disease, unspecified: Secondary | ICD-10-CM

## 2017-04-01 DIAGNOSIS — E119 Type 2 diabetes mellitus without complications: Secondary | ICD-10-CM | POA: Diagnosis not present

## 2017-04-01 DIAGNOSIS — I2510 Atherosclerotic heart disease of native coronary artery without angina pectoris: Secondary | ICD-10-CM | POA: Diagnosis not present

## 2017-04-01 DIAGNOSIS — Z87891 Personal history of nicotine dependence: Secondary | ICD-10-CM | POA: Diagnosis not present

## 2017-04-01 DIAGNOSIS — I10 Essential (primary) hypertension: Secondary | ICD-10-CM

## 2017-04-01 DIAGNOSIS — F17200 Nicotine dependence, unspecified, uncomplicated: Secondary | ICD-10-CM | POA: Diagnosis not present

## 2017-04-01 DIAGNOSIS — M79605 Pain in left leg: Secondary | ICD-10-CM | POA: Diagnosis not present

## 2017-04-01 DIAGNOSIS — E1151 Type 2 diabetes mellitus with diabetic peripheral angiopathy without gangrene: Secondary | ICD-10-CM | POA: Diagnosis not present

## 2017-04-01 DIAGNOSIS — E785 Hyperlipidemia, unspecified: Secondary | ICD-10-CM

## 2017-04-01 DIAGNOSIS — M79604 Pain in right leg: Secondary | ICD-10-CM | POA: Diagnosis not present

## 2017-04-01 DIAGNOSIS — M5410 Radiculopathy, site unspecified: Secondary | ICD-10-CM

## 2017-04-01 LAB — LIPID PANEL
CHOLESTEROL TOTAL: 239 mg/dL — AB (ref 100–199)
Chol/HDL Ratio: 6.6 ratio — ABNORMAL HIGH (ref 0.0–5.0)
HDL: 36 mg/dL — ABNORMAL LOW (ref >39)
Triglycerides: 432 mg/dL — ABNORMAL HIGH (ref 0–149)

## 2017-04-01 LAB — COMPREHENSIVE METABOLIC PANEL
A/G RATIO: 1.3 (ref 1.2–2.2)
ALT: 28 IU/L (ref 0–44)
AST: 23 IU/L (ref 0–40)
Albumin: 4 g/dL (ref 3.6–4.8)
Alkaline Phosphatase: 131 IU/L — ABNORMAL HIGH (ref 39–117)
BILIRUBIN TOTAL: 0.3 mg/dL (ref 0.0–1.2)
BUN / CREAT RATIO: 21 (ref 10–24)
BUN: 20 mg/dL (ref 8–27)
CALCIUM: 9.2 mg/dL (ref 8.6–10.2)
CHLORIDE: 102 mmol/L (ref 96–106)
CO2: 22 mmol/L (ref 20–29)
Creatinine, Ser: 0.94 mg/dL (ref 0.76–1.27)
GFR calc Af Amer: 99 mL/min/{1.73_m2} (ref >59)
GFR calc non Af Amer: 85 mL/min/{1.73_m2} (ref >59)
GLOBULIN, TOTAL: 3 g/dL (ref 1.5–4.5)
GLUCOSE: 125 mg/dL — AB (ref 65–99)
Potassium: 4.6 mmol/L (ref 3.5–5.2)
Sodium: 140 mmol/L (ref 134–144)
TOTAL PROTEIN: 7 g/dL (ref 6.0–8.5)

## 2017-04-01 LAB — CBC
HEMATOCRIT: 43.8 % (ref 37.5–51.0)
Hemoglobin: 14.7 g/dL (ref 13.0–17.7)
MCH: 30.3 pg (ref 26.6–33.0)
MCHC: 33.6 g/dL (ref 31.5–35.7)
MCV: 90 fL (ref 79–97)
Platelets: 208 10*3/uL (ref 150–379)
RBC: 4.85 x10E6/uL (ref 4.14–5.80)
RDW: 14.2 % (ref 12.3–15.4)
WBC: 7 10*3/uL (ref 3.4–10.8)

## 2017-04-01 LAB — MICROALBUMIN / CREATININE URINE RATIO: Creatinine, Urine: 40.4 mg/dL

## 2017-04-01 NOTE — Telephone Encounter (Signed)
Paperwork scanned and faxed to HR dept and uploaded to patients MyChart

## 2017-04-09 ENCOUNTER — Encounter: Payer: Self-pay | Admitting: Family Medicine

## 2017-04-09 DIAGNOSIS — I779 Disorder of arteries and arterioles, unspecified: Secondary | ICD-10-CM | POA: Insufficient documentation

## 2017-04-14 ENCOUNTER — Telehealth: Payer: Self-pay

## 2017-04-14 NOTE — Telephone Encounter (Signed)
Spoke with patient and wife about [place] results. Pt made aware that there was a moderate blockage of the Rt leg arteries and a mild blockage of Lt leg. Advised to continue platelet therapy and limit exercise until seen by vascular. Wife stated that they have an apt with vascular Sept 4th. No further questions.

## 2017-04-14 NOTE — Telephone Encounter (Signed)
LVM to call back in regards to [place] results.

## 2017-04-20 ENCOUNTER — Ambulatory Visit (INDEPENDENT_AMBULATORY_CARE_PROVIDER_SITE_OTHER): Payer: 59 | Admitting: Cardiovascular Disease

## 2017-04-20 ENCOUNTER — Encounter: Payer: Self-pay | Admitting: Cardiovascular Disease

## 2017-04-20 VITALS — BP 146/76 | HR 64 | Ht 69.0 in | Wt 231.0 lb

## 2017-04-20 DIAGNOSIS — I2510 Atherosclerotic heart disease of native coronary artery without angina pectoris: Secondary | ICD-10-CM

## 2017-04-20 DIAGNOSIS — I739 Peripheral vascular disease, unspecified: Secondary | ICD-10-CM

## 2017-04-20 DIAGNOSIS — E785 Hyperlipidemia, unspecified: Secondary | ICD-10-CM | POA: Diagnosis not present

## 2017-04-20 DIAGNOSIS — I10 Essential (primary) hypertension: Secondary | ICD-10-CM | POA: Diagnosis not present

## 2017-04-20 NOTE — Progress Notes (Signed)
Cardiology Office Note   Date:  04/20/2017   ID:  [name], DOB [birth year], MRN [id number]  PCP:  Shawnee Knapp, MD  Cardiologist:  Dr. Stanford Breed  No chief complaint on file.     History of Present Illness: [name] is a 66 y.o. male who was referred by Dr. Brigitte Pulse for evaluation and management of peripheral arterial disease. He has known history of coronary artery disease status post multiple PCI's on subsequent CABG in 2007, essential hypertension, hyperlipidemia, borderline DM and COPD. he quit smoking one month ago. The patient started having bilateral calf claudication worse on the right side over the last year which has gradually progressed and currently happening after walking less than half a block and forces him to stop and rest for few minutes before he can resume. Even after he rests, he feels continuous heaviness and cramping especially on the right side. This has significantly affected his ability to work at [employer] He feels that if his symptoms do not improve, he will not be able to continue his work. He had an ABI done at [facility] which was moderately reduced on the right side at 0.65 and mildly reduced on the left at 0.82. He was placed on cilostazol and reports some improvement in the left calf pain but no change on the right side. He denies chest pain or shortness of breath.   Past Medical History:  Diagnosis Date  . Arthritis   . Asthma    occ bronchitis  . COPD (chronic obstructive pulmonary disease) ([place])   . Coronary artery disease    a. s/p multiple prior PCIs; b. s/p CABG 06/2006 (L-LAD, S-Int and OM, S-PDA);  c. ETT-Myoview 4/13: low risk, no ischemia, EF 53%  . GERD (gastroesophageal reflux disease)   . Hx of echocardiogram    Echo 10/2006: normal LVF  . Hyperlipidemia   . Hypertension   . Myocardial infarction ([place])   . Seasonal allergies     Past Surgical History:  Procedure Laterality Date  . ACNE CYST REMOVAL    . CARDIAC CATHETERIZATION     . COLONOSCOPY    . CORONARY ARTERY BYPASS GRAFT  2007  . CORONARY STENT PLACEMENT     x5 prior to cabg  . KNEE ARTHROSCOPY     rt knee  . SHOULDER ARTHROSCOPY  12/14/2011   removed bone spurs and repositioned muscle.   . TONSILLECTOMY       Current Outpatient Prescriptions  Medication Sig Dispense Refill  . albuterol (PROVENTIL HFA;VENTOLIN HFA) 108 (90 Base) MCG/ACT inhaler Inhale 2 puffs into the lungs every 4 (four) hours as needed for wheezing or shortness of breath (cough, shortness of breath or wheezing.). 1 Inhaler 0  . amLODipine (NORVASC) 10 MG tablet TAKE 1 TABLET (10 MG TOTAL) BY MOUTH DAILY. 90 tablet 3  . aspirin 81 MG tablet Take 81 mg by mouth daily.    . Bilberry 1000 MG CAPS Take by mouth.    . blood glucose meter kit and supplies KIT Dispense based on patient and insurance preference. Use up to four times daily as directed. (FOR ICD-9 250.00, 250.01). 1 each 0  . cetirizine (ZYRTEC) 10 MG tablet Take 10 mg by mouth daily.    . cilostazol (PLETAL) 100 MG tablet Take 1 tablet (100 mg total) by mouth 2 (two) times daily. Take 30  Minutes before or 2 hours after food 60 tablet 0  . co-enzyme Q-10 30 MG capsule Take  30 mg by mouth daily.     . diphenhydramine-acetaminophen (TYLENOL PM EXTRA STRENGTH) 25-500 MG TABS Take 2 tablets by mouth at bedtime.     Marland Kitchen lisinopril (PRINIVIL,ZESTRIL) 5 MG tablet Take 1 tablet (5 mg total) by mouth daily. 90 tablet 0  . LIVALO 1 MG TABS TAKE 1 TABLET BY MOUTH DAILY. 90 tablet 0  . Magnesium Citrate 200 MG TABS Take 1 tablet by mouth daily.    . magnesium oxide (MAG-OX) 400 MG tablet Take 400 mg by mouth daily.    . metoprolol succinate (TOPROL-XL) 50 MG 24 hr tablet TAKE 1 TABLET BY MOUTH DAILY. TAKE WITH OR IMMEDIATELY FOLLOWING A MEAL. 30 tablet 0  . Misc Natural Products (LUTEIN 20 PO) Take 1 capsule by mouth daily.     . Multiple Vitamins-Minerals (MULTIVITAMIN ADULTS PO) Take by mouth.    . naproxen sodium (ANAPROX) 220 MG tablet  Take 220 mg by mouth 2 (two) times daily with a meal. TAKE TWO TABS EVERY 12 HOURS FOR PAIN    . omeprazole (PRILOSEC) 20 MG capsule TAKE 1 CAPSULE (20 MG TOTAL) BY MOUTH DAILY. 30 capsule 0  . OVER THE COUNTER MEDICATION Take 500 mg by mouth daily. Turmeric     No current facility-administered medications for this visit.     Allergies:   Codeine    Social History:  The patient  reports that he quit smoking about 4 weeks ago. His smoking use included Cigarettes. He smoked 1.00 pack per day. He has never used smokeless tobacco. He reports that he drinks alcohol. He reports that he does not use drugs.   Family History:  The patient's family history includes Coronary artery disease in his father; Diabetes in his mother; Heart disease in his brother, father, sister, and sister.    ROS:  Please see the history of present illness.   Otherwise, review of systems are positive for none.   All other systems are reviewed and negative.    PHYSICAL EXAM: VS:  BP (!) 146/76 (BP Location: Right Arm, Cuff Size: Normal)   Pulse 64   Ht _0  (1.753 m)   Wt 231 lb (104.8 kg)   BMI 34.11 kg/m  , BMI Body mass index is 34.11 kg/m. GEN: Well nourished, well developed, in no acute distress  HEENT: normal  Neck: no JVD, carotid bruits, or masses Cardiac: RRR; no rubs, or gallops,no edema . There is one out of 6 systolic ejection murmur in the aortic area. Respiratory:  clear to auscultation bilaterally, normal work of breathing GI: soft, nontender, nondistended, + BS MS: no deformity or atrophy  Skin: warm and dry, no rash Neuro:  Strength and sensation are intact Psych: euthymic mood, full affect Vascular: Femoral pulses are normal bilaterally. Distal pulses are not palpable.  EKG:  EKG is ordered today. The ekg ordered today demonstrates normal sinus rhythm with sinus arrhythmia. Nonspecific T wave changes.   Recent Labs: 03/31/2017: ALT 28; BUN 20; Creatinine, Ser 0.94; Hemoglobin 14.7;  Platelets 208; Potassium 4.6; Sodium 140    Lipid Panel    Component Value Date/Time   CHOL 239 (H) 03/31/2017 1112   TRIG 432 (H) 03/31/2017 1112   HDL 36 (L) 03/31/2017 1112   CHOLHDL 6.6 (H) 03/31/2017 1112   CHOLHDL 5.5 (H) 11/19/2015 0806   VLDL 55 (H) 11/19/2015 0806   LDLCALC Comment 03/31/2017 1112   LDLDIRECT 91.0 06/16/2013 0906      Wt Readings from Last 3 Encounters:  04/20/17 231 lb (104.8 kg)  03/31/17 226 lb 9.6 oz (102.8 kg)  11/02/16 224 lb (101.6 kg)        PAD Screen 04/20/2017  Previous PAD dx? Yes  Previous surgical procedure? Yes  Dates of procedures 2006 unsure about which side it was placed  Pain with walking? Yes  Subsides with rest? Yes  Feet/toe relief with dangling? No  Painful, non-healing ulcers? No  Extremities discolored? No      ASSESSMENT AND PLAN:  1.  Peripheral arterial disease with severe right calf claudication which is currently lifestyle limiting. No significant improvement since his last visit. The patient feels that he is not able to do his work if symptoms do not change. Given this limitation, I recommend proceeding with abdominal aortogram, lower extremity runoff and possible endovascular intervention with the focus on the right leg. I discussed the procedure in details as well as risks and benefits.  2. Coronary artery disease involving native coronary arteries without angina: Continue medical therapy.  3. Essential hypertension: Blood pressure is mildly elevated.  4. Hyperlipidemia: Currently on Pitavastatin.    Disposition:   FU with me in 1 month  Signed,  Kathlyn Sacramento, MD  04/20/2017 5:29 PM    [facility]

## 2017-04-20 NOTE — Patient Instructions (Signed)
   [facility] CARDIOVASCULAR DIVISION [facility] [address] [place] [address] Dept: [phone] Loc: [phone]  BEE HEFEL  04/20/2017  You are scheduled for a Peripheral Angiogram on Wednesday, August 29 with Dr. Lorine Bears.  1. Please arrive at the [facility] (Main Entrance A) at [facility]: [address] [place], [place] [address] at 6:30 AM (two hours before your procedure to ensure your preparation). Free valet parking service is available.   Special note: Every effort is made to have your procedure done on time. Please understand that emergencies sometimes delay scheduled procedures.  2. Diet: Do not eat or drink anything after midnight prior to your procedure except sips of water to take medications.  3. Labs: You will need to have blood drawn on Monday, August 27 at [facility] [address], [place]  Open: 8am - 5pm (Lunch 12:30 - 1:30)   Phone: [phone]. You do not need to be fasting.  4. Medication instructions in preparation for your procedure:   On the morning of your procedure, take your Aspirin and Plavix/Clopidogrel and all morning medicines.  You may use sips of water.  5. Plan for one night stay--bring personal belongings. 6. Bring a current list of your medications and current insurance cards. 7. You MUST have a responsible person to drive you home. 8. Someone MUST be with you the first 24 hours after you arrive home or your discharge will be delayed. 9. Please wear clothes that are easy to get on and off and wear slip-on shoes.  Thank you for allowing [place] to care for you!   -- [facility]

## 2017-04-21 ENCOUNTER — Telehealth: Payer: Self-pay | Admitting: Cardiovascular Disease

## 2017-04-21 NOTE — Telephone Encounter (Signed)
Only opening on 8-22 was 2nd case @1030am  pt will be there @ 830am and have lab done 2 days before as directed, informed pt to follow same directions as planned as discussed.

## 2017-04-21 NOTE — Telephone Encounter (Signed)
New message    Pt is calling to see if he can move his appt up to the 22nd instead of the 29th. Please call.

## 2017-04-26 DIAGNOSIS — I739 Peripheral vascular disease, unspecified: Secondary | ICD-10-CM | POA: Diagnosis not present

## 2017-04-27 LAB — BASIC METABOLIC PANEL
BUN / CREAT RATIO: 18 (ref 10–24)
BUN: 19 mg/dL (ref 8–27)
CO2: 23 mmol/L (ref 20–29)
CREATININE: 1.04 mg/dL (ref 0.76–1.27)
Calcium: 9.6 mg/dL (ref 8.6–10.2)
Chloride: 102 mmol/L (ref 96–106)
GFR calc non Af Amer: 76 mL/min/{1.73_m2} (ref >59)
GFR, EST AFRICAN AMERICAN: 87 mL/min/{1.73_m2} (ref >59)
Glucose: 120 mg/dL — ABNORMAL HIGH (ref 65–99)
Potassium: 5.2 mmol/L (ref 3.5–5.2)
Sodium: 141 mmol/L (ref 134–144)

## 2017-04-27 LAB — PROTIME-INR
INR: 1 (ref 0.8–1.2)
Prothrombin Time: 10.1 s (ref 9.1–12.0)

## 2017-04-27 LAB — CBC
HEMOGLOBIN: 13.5 g/dL (ref 13.0–17.7)
Hematocrit: 40.7 % (ref 37.5–51.0)
MCH: 29.2 pg (ref 26.6–33.0)
MCHC: 33.2 g/dL (ref 31.5–35.7)
MCV: 88 fL (ref 79–97)
Platelets: 211 10*3/uL (ref 150–379)
RBC: 4.63 x10E6/uL (ref 4.14–5.80)
RDW: 13.9 % (ref 12.3–15.4)
WBC: 8.6 10*3/uL (ref 3.4–10.8)

## 2017-04-28 ENCOUNTER — Encounter (HOSPITAL_COMMUNITY)
Admission: RE | Disposition: A | Discharge status: Home / Self Care | Payer: Self-pay | Source: Ambulatory Visit | Attending: Cardiovascular Disease

## 2017-04-28 ENCOUNTER — Ambulatory Visit (HOSPITAL_COMMUNITY)
Admission: RE | Admit: 2017-04-28 | Discharge: 2017-04-28 | Disposition: A | Discharge status: Home / Self Care | Payer: 59 | Source: Ambulatory Visit | Attending: Cardiovascular Disease | Admitting: Cardiovascular Disease

## 2017-04-28 DIAGNOSIS — I70213 Atherosclerosis of native arteries of extremities with intermittent claudication, bilateral legs: Secondary | ICD-10-CM | POA: Diagnosis not present

## 2017-04-28 DIAGNOSIS — Z7982 Long term (current) use of aspirin: Secondary | ICD-10-CM | POA: Insufficient documentation

## 2017-04-28 DIAGNOSIS — M1990 Unspecified osteoarthritis, unspecified site: Secondary | ICD-10-CM | POA: Insufficient documentation

## 2017-04-28 DIAGNOSIS — Z955 Presence of coronary angioplasty implant and graft: Secondary | ICD-10-CM | POA: Insufficient documentation

## 2017-04-28 DIAGNOSIS — I739 Peripheral vascular disease, unspecified: Secondary | ICD-10-CM | POA: Diagnosis present

## 2017-04-28 DIAGNOSIS — E785 Hyperlipidemia, unspecified: Secondary | ICD-10-CM | POA: Insufficient documentation

## 2017-04-28 DIAGNOSIS — K219 Gastro-esophageal reflux disease without esophagitis: Secondary | ICD-10-CM | POA: Insufficient documentation

## 2017-04-28 DIAGNOSIS — Z951 Presence of aortocoronary bypass graft: Secondary | ICD-10-CM | POA: Insufficient documentation

## 2017-04-28 DIAGNOSIS — I10 Essential (primary) hypertension: Secondary | ICD-10-CM | POA: Diagnosis not present

## 2017-04-28 DIAGNOSIS — R7309 Other abnormal glucose: Secondary | ICD-10-CM | POA: Insufficient documentation

## 2017-04-28 DIAGNOSIS — I252 Old myocardial infarction: Secondary | ICD-10-CM | POA: Insufficient documentation

## 2017-04-28 DIAGNOSIS — I70211 Atherosclerosis of native arteries of extremities with intermittent claudication, right leg: Secondary | ICD-10-CM | POA: Insufficient documentation

## 2017-04-28 DIAGNOSIS — I714 Abdominal aortic aneurysm, without rupture: Secondary | ICD-10-CM | POA: Diagnosis not present

## 2017-04-28 DIAGNOSIS — I2510 Atherosclerotic heart disease of native coronary artery without angina pectoris: Secondary | ICD-10-CM | POA: Insufficient documentation

## 2017-04-28 DIAGNOSIS — Z87891 Personal history of nicotine dependence: Secondary | ICD-10-CM | POA: Diagnosis not present

## 2017-04-28 DIAGNOSIS — J449 Chronic obstructive pulmonary disease, unspecified: Secondary | ICD-10-CM | POA: Diagnosis not present

## 2017-04-28 HISTORY — PX: LOWER EXTREMITY ANGIOGRAPHY: CATH118251

## 2017-04-28 HISTORY — PX: ABDOMINAL AORTOGRAM: CATH118222

## 2017-04-28 SURGERY — LOWER EXTREMITY ANGIOGRAPHY
Anesthesia: LOCAL

## 2017-04-28 MED ORDER — SODIUM CHLORIDE 0.9% FLUSH: 3.0000 mL | INTRAVENOUS | Status: DC | PRN

## 2017-04-28 MED ORDER — IODIXANOL 320 MG/ML IV SOLN
INTRAVENOUS | Status: DC | PRN
  Administered 2017-04-28: 125 mL via INTRAVENOUS

## 2017-04-28 MED ORDER — FENTANYL CITRATE (PF) 100 MCG/2ML IJ SOLN
INTRAMUSCULAR | Status: DC | PRN
  Administered 2017-04-28: 50 ug via INTRAVENOUS

## 2017-04-28 MED ORDER — HEPARIN (PORCINE) IN NACL 2-0.9 UNIT/ML-% IJ SOLN
INTRAMUSCULAR | Status: AC
  Filled 2017-04-28: qty 1000

## 2017-04-28 MED ORDER — SODIUM CHLORIDE 0.9% FLUSH: 3.0000 mL | Freq: Two times a day (BID) | INTRAVENOUS | Status: DC

## 2017-04-28 MED ORDER — SODIUM CHLORIDE 0.9 % IV SOLN
INTRAVENOUS | Status: DC
  Administered 2017-04-28: 09:00:00 via INTRAVENOUS

## 2017-04-28 MED ORDER — MIDAZOLAM HCL 2 MG/2ML IJ SOLN
INTRAMUSCULAR | Status: AC
  Filled 2017-04-28: qty 2

## 2017-04-28 MED ORDER — SODIUM CHLORIDE 0.9 % IV SOLN
INTRAVENOUS | Status: AC
  Administered 2017-04-28: 12:00:00 via INTRAVENOUS

## 2017-04-28 MED ORDER — LIDOCAINE HCL (PF) 1 % IJ SOLN
INTRAMUSCULAR | Status: DC | PRN
  Administered 2017-04-28: 18 mL

## 2017-04-28 MED ORDER — MIDAZOLAM HCL 2 MG/2ML IJ SOLN
INTRAMUSCULAR | Status: DC | PRN
  Administered 2017-04-28: 1 mg via INTRAVENOUS

## 2017-04-28 MED ORDER — SODIUM CHLORIDE 0.9 % IV SOLN: 250.0000 mL | INTRAVENOUS | Status: DC | PRN

## 2017-04-28 MED ORDER — LIDOCAINE HCL (PF) 1 % IJ SOLN
INTRAMUSCULAR | Status: AC
  Filled 2017-04-28: qty 30

## 2017-04-28 MED ORDER — ASPIRIN 81 MG PO CHEW: 81.0000 mg | CHEWABLE_TABLET | ORAL | Status: DC

## 2017-04-28 MED ORDER — FENTANYL CITRATE (PF) 100 MCG/2ML IJ SOLN
INTRAMUSCULAR | Status: AC
  Filled 2017-04-28: qty 2

## 2017-04-28 MED ORDER — HEPARIN (PORCINE) IN NACL 2-0.9 UNIT/ML-% IJ SOLN
INTRAMUSCULAR | Status: AC | PRN
  Administered 2017-04-28: 1000 mL

## 2017-04-28 SURGICAL SUPPLY — 10 items
CATH ANGIO 5F PIGTAIL 65CM (CATHETERS) ×3 IMPLANT
COVER DOME SNAP 22 D (MISCELLANEOUS) ×3 IMPLANT
KIT PV (KITS) ×3 IMPLANT
SHEATH PINNACLE 5F 10CM (SHEATH) ×3 IMPLANT
STOPCOCK MORSE 400PSI 3WAY (MISCELLANEOUS) ×3 IMPLANT
SYRINGE MEDRAD AVANTA MACH 7 (SYRINGE) ×3 IMPLANT
TRANSDUCER W/STOPCOCK (MISCELLANEOUS) ×3 IMPLANT
TRAY PV CATH (CUSTOM PROCEDURE TRAY) ×3 IMPLANT
TUBING CIL FLEX 10 FLL-RA (TUBING) ×3 IMPLANT
WIRE HITORQ VERSACORE ST 145CM (WIRE) ×3 IMPLANT

## 2017-04-28 NOTE — H&P (View-Only) (Signed)
  Cardiology Office Note   Date:  04/20/2017   ID:  [name], DOB 05/07/1951, MRN [id number]  PCP:  Shaw, Eva N, MD  Cardiologist:  Dr. Crenshaw  No chief complaint on file.     History of Present Illness: [name] is a 66 y.o. male who was referred by Dr. Shaw for evaluation and management of peripheral arterial disease. He has known history of coronary artery disease status post multiple PCI's on subsequent CABG in 2007, essential hypertension, hyperlipidemia, borderline DM and COPD. he quit smoking one month ago. The patient started having bilateral calf claudication worse on the right side over the last year which has gradually progressed and currently happening after walking less than half a block and forces him to stop and rest for few minutes before he can resume. Even after he rests, he feels continuous heaviness and cramping especially on the right side. This has significantly affected his ability to work at [employer]. He feels that if his symptoms do not improve, he will not be able to continue his work. He had an ABI done at [facility] which was moderately reduced on the right side at 0.65 and mildly reduced on the left at 0.82. He was placed on cilostazol and reports some improvement in the left calf pain but no change on the right side. He denies chest pain or shortness of breath.   Past Medical History:  Diagnosis Date  . Arthritis   . Asthma    occ bronchitis  . COPD (chronic obstructive pulmonary disease) (HCC)   . Coronary artery disease    a. s/p multiple prior PCIs; b. s/p CABG 06/2006 (L-LAD, S-Int and OM, S-PDA);  c. ETT-Myoview 4/13: low risk, no ischemia, EF 53%  . GERD (gastroesophageal reflux disease)   . Hx of echocardiogram    Echo 10/2006: normal LVF  . Hyperlipidemia   . Hypertension   . Myocardial infarction (HCC)   . Seasonal allergies     Past Surgical History:  Procedure Laterality Date  . ACNE CYST REMOVAL    . CARDIAC CATHETERIZATION     . COLONOSCOPY    . CORONARY ARTERY BYPASS GRAFT  2007  . CORONARY STENT PLACEMENT     x5 prior to cabg  . KNEE ARTHROSCOPY     rt knee  . SHOULDER ARTHROSCOPY  12/14/2011   removed bone spurs and repositioned muscle.   . TONSILLECTOMY       Current Outpatient Prescriptions  Medication Sig Dispense Refill  . albuterol (PROVENTIL HFA;VENTOLIN HFA) 108 (90 Base) MCG/ACT inhaler Inhale 2 puffs into the lungs every 4 (four) hours as needed for wheezing or shortness of breath (cough, shortness of breath or wheezing.). 1 Inhaler 0  . amLODipine (NORVASC) 10 MG tablet TAKE 1 TABLET (10 MG TOTAL) BY MOUTH DAILY. 90 tablet 3  . aspirin 81 MG tablet Take 81 mg by mouth daily.    . Bilberry 1000 MG CAPS Take by mouth.    . blood glucose meter kit and supplies KIT Dispense based on patient and insurance preference. Use up to four times daily as directed. (FOR ICD-9 250.00, 250.01). 1 each 0  . cetirizine (ZYRTEC) 10 MG tablet Take 10 mg by mouth daily.    . cilostazol (PLETAL) 100 MG tablet Take 1 tablet (100 mg total) by mouth 2 (two) times daily. Take 30  Minutes before or 2 hours after food 60 tablet 0  . co-enzyme Q-10 30 MG capsule Take   30 mg by mouth daily.     . diphenhydramine-acetaminophen (TYLENOL PM EXTRA STRENGTH) 25-500 MG TABS Take 2 tablets by mouth at bedtime.     . lisinopril (PRINIVIL,ZESTRIL) 5 MG tablet Take 1 tablet (5 mg total) by mouth daily. 90 tablet 0  . LIVALO 1 MG TABS TAKE 1 TABLET BY MOUTH DAILY. 90 tablet 0  . Magnesium Citrate 200 MG TABS Take 1 tablet by mouth daily.    . magnesium oxide (MAG-OX) 400 MG tablet Take 400 mg by mouth daily.    . metoprolol succinate (TOPROL-XL) 50 MG 24 hr tablet TAKE 1 TABLET BY MOUTH DAILY. TAKE WITH OR IMMEDIATELY FOLLOWING A MEAL. 30 tablet 0  . Misc Natural Products (LUTEIN 20 PO) Take 1 capsule by mouth daily.     . Multiple Vitamins-Minerals (MULTIVITAMIN ADULTS PO) Take by mouth.    . naproxen sodium (ANAPROX) 220 MG tablet  Take 220 mg by mouth 2 (two) times daily with a meal. TAKE TWO TABS EVERY 12 HOURS FOR PAIN    . omeprazole (PRILOSEC) 20 MG capsule TAKE 1 CAPSULE (20 MG TOTAL) BY MOUTH DAILY. 30 capsule 0  . OVER THE COUNTER MEDICATION Take 500 mg by mouth daily. Turmeric     No current facility-administered medications for this visit.     Allergies:   Codeine    Social History:  The patient  reports that he quit smoking about 4 weeks ago. His smoking use included Cigarettes. He smoked 1.00 pack per day. He has never used smokeless tobacco. He reports that he drinks alcohol. He reports that he does not use drugs.   Family History:  The patient's family history includes Coronary artery disease in his father; Diabetes in his mother; Heart disease in his brother, father, sister, and sister.    ROS:  Please see the history of present illness.   Otherwise, review of systems are positive for none.   All other systems are reviewed and negative.    PHYSICAL EXAM: VS:  BP (!) 146/76 (BP Location: Right Arm, Cuff Size: Normal)   Pulse 64   Ht 5' 9" (1.753 m)   Wt 231 lb (104.8 kg)   BMI 34.11 kg/m  , BMI Body mass index is 34.11 kg/m. GEN: Well nourished, well developed, in no acute distress  HEENT: normal  Neck: no JVD, carotid bruits, or masses Cardiac: RRR; no rubs, or gallops,no edema . There is one out of 6 systolic ejection murmur in the aortic area. Respiratory:  clear to auscultation bilaterally, normal work of breathing GI: soft, nontender, nondistended, + BS MS: no deformity or atrophy  Skin: warm and dry, no rash Neuro:  Strength and sensation are intact Psych: euthymic mood, full affect Vascular: Femoral pulses are normal bilaterally. Distal pulses are not palpable.  EKG:  EKG is ordered today. The ekg ordered today demonstrates normal sinus rhythm with sinus arrhythmia. Nonspecific T wave changes.   Recent Labs: 03/31/2017: ALT 28; BUN 20; Creatinine, Ser 0.94; Hemoglobin 14.7;  Platelets 208; Potassium 4.6; Sodium 140    Lipid Panel    Component Value Date/Time   CHOL 239 (H) 03/31/2017 1112   TRIG 432 (H) 03/31/2017 1112   HDL 36 (L) 03/31/2017 1112   CHOLHDL 6.6 (H) 03/31/2017 1112   CHOLHDL 5.5 (H) 11/19/2015 0806   VLDL 55 (H) 11/19/2015 0806   LDLCALC Comment 03/31/2017 1112   LDLDIRECT 91.0 06/16/2013 0906      Wt Readings from Last 3 Encounters:    04/20/17 231 lb (104.8 kg)  03/31/17 226 lb 9.6 oz (102.8 kg)  11/02/16 224 lb (101.6 kg)        PAD Screen 04/20/2017  Previous PAD dx? Yes  Previous surgical procedure? Yes  Dates of procedures 2006 unsure about which side it was placed  Pain with walking? Yes  Subsides with rest? Yes  Feet/toe relief with dangling? No  Painful, non-healing ulcers? No  Extremities discolored? No      ASSESSMENT AND PLAN:  1.  Peripheral arterial disease with severe right calf claudication which is currently lifestyle limiting. No significant improvement since his last visit. The patient feels that he is not able to do his work if symptoms do not change. Given this limitation, I recommend proceeding with abdominal aortogram, lower extremity runoff and possible endovascular intervention with the focus on the right leg. I discussed the procedure in details as well as risks and benefits.  2. Coronary artery disease involving native coronary arteries without angina: Continue medical therapy.  3. Essential hypertension: Blood pressure is mildly elevated.  4. Hyperlipidemia: Currently on Pitavastatin.    Disposition:   FU with me in 1 month  Signed,  Lamonica Trueba, MD  04/20/2017 5:29 PM    [facility] 

## 2017-04-28 NOTE — Progress Notes (Signed)
Removal of Left arterial femoral sheath without complication. Manual compression applied for 25 minutes. Groin stable with no oozing, bruising or hematoma present. Distal pulses palpable. Bp: 131/53, SP02:97%, HR: 65. Sterile 4x4 gauze applied to insertion site with Tegaderm to secure dressing. Dressing dry and intact. Patient given post care instructions and was able to repeat post care. Patient states that he still has a small amount of lower back pain however, is better with movement of right leg to relieve pressure.

## 2017-04-28 NOTE — Discharge Instructions (Signed)

## 2017-04-28 NOTE — Interval H&P Note (Signed)
History and Physical Interval Note:  04/28/2017 10:49 AM  [name]  has presented today for surgery, with the diagnosis of pad  The various methods of treatment have been discussed with the patient and family. After consideration of risks, benefits and other options for treatment, the patient has consented to  Procedure(s): ABDOMINAL AORTOGRAM W/LOWER EXTREMITY (N/A) as a surgical intervention .  The patient's history has been reviewed, patient examined, no change in status, stable for surgery.  I have reviewed the patient's chart and labs.  Questions were answered to the patient's satisfaction.     Lorine Bears

## 2017-04-29 ENCOUNTER — Encounter (HOSPITAL_COMMUNITY): Payer: Self-pay | Admitting: Cardiovascular Disease

## 2017-05-05 ENCOUNTER — Other Ambulatory Visit: Payer: Self-pay | Admitting: Family Medicine

## 2017-05-06 ENCOUNTER — Encounter: Payer: 59 | Admitting: Vascular Surgery

## 2017-05-06 NOTE — Telephone Encounter (Signed)
SS refill req for Metoprolol. Last OV note discusses Amlodipine, Lisinopril. No mention of Metoprolol.  Sent to Dr. Clelia Croft to clarify

## 2017-05-11 ENCOUNTER — Ambulatory Visit: Payer: 59 | Admitting: Cardiovascular Disease

## 2017-05-17 MED FILL — METOPROLOL SUCC ER 50 MG TA: 50 | 90 days supply | Qty: 90 | Fill #0

## 2017-05-21 ENCOUNTER — Ambulatory Visit: Payer: 59 | Admitting: Cardiovascular Disease

## 2017-05-31 ENCOUNTER — Other Ambulatory Visit: Payer: Self-pay | Admitting: Family Medicine

## 2017-05-31 MED FILL — CILOSTAZOL 100 MG TABLET: 100 | 30 days supply | Qty: 60 | Fill #0

## 2017-06-11 ENCOUNTER — Encounter: Payer: Self-pay | Admitting: Cardiovascular Disease

## 2017-06-11 ENCOUNTER — Ambulatory Visit: Payer: 59 | Admitting: Cardiovascular Disease

## 2017-06-11 ENCOUNTER — Ambulatory Visit (INDEPENDENT_AMBULATORY_CARE_PROVIDER_SITE_OTHER): Payer: 59 | Admitting: Cardiovascular Disease

## 2017-06-11 VITALS — BP 140/70 | HR 60 | Ht 70.0 in | Wt 231.0 lb

## 2017-06-11 DIAGNOSIS — I10 Essential (primary) hypertension: Secondary | ICD-10-CM | POA: Diagnosis not present

## 2017-06-11 DIAGNOSIS — I2510 Atherosclerotic heart disease of native coronary artery without angina pectoris: Secondary | ICD-10-CM | POA: Diagnosis not present

## 2017-06-11 DIAGNOSIS — E785 Hyperlipidemia, unspecified: Secondary | ICD-10-CM | POA: Diagnosis not present

## 2017-06-11 DIAGNOSIS — I739 Peripheral vascular disease, unspecified: Secondary | ICD-10-CM | POA: Diagnosis not present

## 2017-06-11 NOTE — Patient Instructions (Addendum)
Medication Instructions:  Your physician recommends that you continue on your current medications as directed. Please refer to the Current Medication list given to you today.   Labwork: none  Testing/Procedures: none  Follow-Up: Your physician wants you to follow-up in: 3 months with Dr. Kirke Corin.  You will receive a reminder letter in the mail two months in advance. If you don't receive a letter, please call our office to schedule the follow-up appointment.   Any Other Special Instructions Will Be Listed Below (If Applicable). Referral to [facility] [address] [place] [phone]  You should receive a call from their office to set up an appointment.      If you need a refill on your cardiac medications before your next appointment, please call your pharmacy.

## 2017-06-11 NOTE — Progress Notes (Signed)
Cardiology Office Note   Date:  06/11/2017   ID:  ORLYN ODONOGHUE, DOB 03/19/1951, MRN [id number]  PCP:  Sherren Mocha, MD  Cardiologist:  Dr. [name]  Chief Complaint  Patient presents with  . other    Follow up from [facility] cath. Meds reviewed by the pt. verbally. Pt. c/o bilateral leg pain; mostly in the right. Denies chest pain or shortness of breath.       History of Present Illness: [name] is a 66 y.o. male who is here today for a follow-up visit regarding peripheral arterial disease.   He has known history of coronary artery disease status post multiple PCI's and subsequent CABG in 2007, essential hypertension, hyperlipidemia, borderline DM and COPD. he quit smoking recently. He was seen on August for bilateral calf claudication worse on the right side. He had an ABI done which was moderately reduced on the right side at 0.65 and mildly reduced on the left at 0.82.  He had partial relief with cilostazol.  I proceeded with angiography on August 22 which showed : 1. Small abdominal aortic aneurysm. Mild nonobstructive iliac disease 2. Right lower extremity: Long occlusion of the SFA close to its origin with reconstitution via collaterals from collaterals from the profunda in the distal SFA. One-vessel runoff below the knee via the peroneal artery. 3.  Left lower extremity: No significant SFA or popliteal disease with two-vessel runoff below the knee via the anterior and peroneal arteries. The anterior tibial artery has 80% proximal stenosis.  He reports severe right calf claudication in spite of treatment with cilostazol. He continues to work and he feels limited because of his claudication.  Past Medical History:  Diagnosis Date  . Arthritis   . Asthma    occ bronchitis  . COPD (chronic obstructive pulmonary disease) (HCC)   . Coronary artery disease    a. s/p multiple prior PCIs; b. s/p CABG 06/2006 (L-LAD, S-Int and OM, S-PDA);  c. ETT-Myoview 4/13: low risk, no  ischemia, EF 53%  . GERD (gastroesophageal reflux disease)   . Hx of echocardiogram    Echo 10/2006: normal LVF  . Hyperlipidemia   . Hypertension   . Myocardial infarction (HCC)   . Seasonal allergies     Past Surgical History:  Procedure Laterality Date  . ABDOMINAL AORTOGRAM N/A 04/28/2017   Procedure: ABDOMINAL AORTOGRAM;  Surgeon: Iran Ouch, MD;  Location: [facility] INVASIVE CV LAB;  Service: Cardiovascular;  Laterality: N/A;  . ACNE CYST REMOVAL    . CARDIAC CATHETERIZATION    . COLONOSCOPY    . CORONARY ARTERY BYPASS GRAFT  2007  . CORONARY STENT PLACEMENT     x5 prior to cabg  . KNEE ARTHROSCOPY     rt knee  . LOWER EXTREMITY ANGIOGRAPHY Bilateral 04/28/2017   Procedure: Lower Extremity Angiography;  Surgeon: Iran Ouch, MD;  Location: [facility] INVASIVE CV LAB;  Service: Cardiovascular;  Laterality: Bilateral;  . SHOULDER ARTHROSCOPY  12/14/2011   removed bone spurs and repositioned muscle.   . TONSILLECTOMY       Current Outpatient Prescriptions  Medication Sig Dispense Refill  . albuterol (PROVENTIL HFA;VENTOLIN HFA) 108 (90 Base) MCG/ACT inhaler Inhale 2 puffs into the lungs every 4 (four) hours as needed for wheezing or shortness of breath (cough, shortness of breath or wheezing.). 1 Inhaler 0  . amLODipine (NORVASC) 10 MG tablet TAKE 1 TABLET (10 MG TOTAL) BY MOUTH DAILY. 90 tablet 3  . aspirin EC 81  MG tablet Take 81 mg by mouth daily.    . Bilberry 1000 MG CAPS Take 1,000 mg by mouth daily.     . cetirizine (ZYRTEC) 10 MG tablet Take 10 mg by mouth daily.     . cilostazol (PLETAL) 100 MG tablet TAKE 1 TABLET BY MOUTH TWICE DAILY. TAKE 30 MINUTES BEFORE OR 2 HOURS AFTER FOOD. 60 tablet 0  . co-enzyme Q-10 30 MG capsule Take 30 mg by mouth daily.     . diphenhydramine-acetaminophen (TYLENOL PM EXTRA STRENGTH) 25-500 MG TABS Take 2 tablets by mouth at bedtime.     Marland Kitchen lisinopril (PRINIVIL,ZESTRIL) 5 MG tablet Take 1 tablet (5 mg total) by mouth daily. 90 tablet 0  .  LIVALO 1 MG TABS TAKE 1 TABLET BY MOUTH DAILY. 90 tablet 0  . LUTEIN PO Take 20 mg by mouth daily.     . Magnesium 250 MG TABS Take 250 mg by mouth daily.    . metoprolol succinate (TOPROL-XL) 50 MG 24 hr tablet TAKE 1 TABLET BY MOUTH DAILY. TAKE WITH OR IMMEDIATELY FOLLOWING A MEAL. 90 tablet 1  . Multiple Vitamins-Minerals (MULTIVITAMIN ADULTS PO) Take 1 tablet by mouth daily. VitaFusion    . naproxen sodium (ANAPROX) 220 MG tablet Take 220 mg by mouth 2 (two) times daily as needed (for pain.).     Marland Kitchen omeprazole (PRILOSEC) 20 MG capsule TAKE 1 CAPSULE (20 MG TOTAL) BY MOUTH DAILY. 30 capsule 0   No current facility-administered medications for this visit.     Allergies:   Codeine    Social History:  The patient  reports that he quit smoking about 2 months ago. His smoking use included Cigarettes. He smoked 1.00 pack per day. He has never used smokeless tobacco. He reports that he drinks alcohol. He reports that he does not use drugs.   Family History:  The patient's family history includes Coronary artery disease in his father; Diabetes in his mother; Heart disease in his brother, father, sister, and sister.    ROS:  Please see the history of present illness.   Otherwise, review of systems are positive for none.   All other systems are reviewed and negative.    PHYSICAL EXAM: VS:  BP 140/70 (BP Location: Left Arm, Patient Position: Sitting, Cuff Size: Normal)   Pulse 60   Ht 5\' 10"  (1.778 m)   Wt 231 lb (104.8 kg)   BMI 33.15 kg/m  , BMI Body mass index is 33.15 kg/m. GEN: Well nourished, well developed, in no acute distress  HEENT: normal  Neck: no JVD, carotid bruits, or masses Cardiac: RRR; no rubs, or gallops,no edema . 1/ 6 systolic ejection murmur in the aortic area. Respiratory:  clear to auscultation bilaterally, normal work of breathing GI: soft, nontender, nondistended, + BS MS: no deformity or atrophy  Skin: warm and dry, no rash Neuro:  Strength and sensation are  intact Psych: euthymic mood, full affect Vascular: Femoral pulses are normal bilaterally. Distal pulses are not palpable. No groin hematoma  EKG:  EKG is not ordered today.    Recent Labs: 03/31/2017: ALT 28 04/26/2017: BUN 19; Creatinine, Ser 1.04; Hemoglobin 13.5; Platelets 211; Potassium 5.2; Sodium 141    Lipid Panel    Component Value Date/Time   CHOL 239 (H) 03/31/2017 1112   TRIG 432 (H) 03/31/2017 1112   HDL 36 (L) 03/31/2017 1112   CHOLHDL 6.6 (H) 03/31/2017 1112   CHOLHDL 5.5 (H) 11/19/2015 0806   VLDL 55 (H)  11/19/2015 0806   LDLCALC Comment 03/31/2017 1112   LDLDIRECT 91.0 06/16/2013 0906      Wt Readings from Last 3 Encounters:  06/11/17 231 lb (104.8 kg)  04/28/17 230 lb (104.3 kg)  04/20/17 231 lb (104.8 kg)        PAD Screen 04/20/2017  Previous PAD dx? Yes  Previous surgical procedure? Yes  Dates of procedures 2006 unsure about which side it was placed  Pain with walking? Yes  Subsides with rest? Yes  Feet/toe relief with dangling? No  Painful, non-healing ulcers? No  Extremities discolored? No      ASSESSMENT AND PLAN:  1.  Peripheral arterial disease with severe right calf claudication : He has long occlusion of the right SFA with 1 vessel runoff below the knee. I discussed with him again management of claudication and suggested continued medical therapy and a walking program. However, he feels very limited by this and cannot perform his work with this level of pain. He reports inability to function with this level of discomfort. Due to that, I think the best option is right femoropopliteal bypass. I think this will give him the best long-term results.  2. Coronary artery disease involving native coronary arteries without angina: Continue medical therapy.  3. Essential hypertension: Blood pressure is mildly elevated.  4. Hyperlipidemia: Currently on Pitavastatin.  5. Preoperative cardiovascular evaluation: The patient has known history of  coronary artery disease with previous CABG. However, at the present time he has no anginal symptoms and he continues to be active. Functional capacity is about 4 METs. I don't think he requires further stress testing before surgery. He can proceed at low to moderate risk.   Disposition:   FU with me in 3 months  Signed,  Lorine Bears, MD  06/11/2017 2:35 PM    [facility]

## 2017-06-18 ENCOUNTER — Encounter: Payer: Self-pay | Admitting: Vascular Surgery

## 2017-06-18 ENCOUNTER — Encounter (HOSPITAL_COMMUNITY): Payer: 59

## 2017-06-18 ENCOUNTER — Ambulatory Visit (INDEPENDENT_AMBULATORY_CARE_PROVIDER_SITE_OTHER): Payer: 59 | Admitting: Vascular Surgery

## 2017-06-18 VITALS — BP 170/82 | HR 58 | Temp 97.8°F | Resp 20 | Ht 70.0 in | Wt 230.9 lb

## 2017-06-18 DIAGNOSIS — I714 Abdominal aortic aneurysm, without rupture, unspecified: Secondary | ICD-10-CM

## 2017-06-18 DIAGNOSIS — I70213 Atherosclerosis of native arteries of extremities with intermittent claudication, bilateral legs: Secondary | ICD-10-CM | POA: Diagnosis not present

## 2017-06-18 NOTE — Progress Notes (Signed)
Requested by:  Sherren Mocha, MD [address] [place], [place] [address]  Reason for consultation: bilateral leg angiogram   History of Present Illness   [name] is a 66 y.o. (02-Oct-1950) male who presents with chief complaint: R>L leg pain.  Onset of symptom occurred one year ago without obvious trigger.  Pain is described as cramping and heaviness, severity 3-6/10, and associated with walking.  His intermittent claudication start with walking <1/2 block.  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  Patient has attempted to treat this pain with rest and angiogram.  The patient has no rest pain symptoms also and no leg wounds/ulcers.  Atherosclerotic risk factors include: HLD, HTN, and prior recent smoker.  Past Medical History:  Diagnosis Date  . Arthritis   . Asthma    occ bronchitis  . COPD (chronic obstructive pulmonary disease) (HCC)   . Coronary artery disease    a. s/p multiple prior PCIs; b. s/p CABG 06/2006 (L-LAD, S-Int and OM, S-PDA);  c. ETT-Myoview 4/13: low risk, no ischemia, EF 53%  . GERD (gastroesophageal reflux disease)   . Hx of echocardiogram    Echo 10/2006: normal LVF  . Hyperlipidemia   . Hypertension   . Myocardial infarction (HCC)   . Seasonal allergies     Past Surgical History:  Procedure Laterality Date  . ABDOMINAL AORTOGRAM N/A 04/28/2017   Procedure: ABDOMINAL AORTOGRAM;  Surgeon: Iran Ouch, MD;  Location: [facility];  Service: Cardiovascular;  Laterality: N/A;  . ACNE CYST REMOVAL    . CARDIAC CATHETERIZATION    . COLONOSCOPY    . CORONARY ARTERY BYPASS GRAFT  2007  . CORONARY STENT PLACEMENT     x5 prior to cabg  . KNEE ARTHROSCOPY     rt knee  . LOWER EXTREMITY ANGIOGRAPHY Bilateral 04/28/2017   Procedure: Lower Extremity Angiography;  Surgeon: Iran Ouch, MD;  Location: [facility] INVASIVE CV LAB;  Service: Cardiovascular;   Laterality: Bilateral;  . SHOULDER ARTHROSCOPY  12/14/2011   removed bone spurs and repositioned muscle.   . TONSILLECTOMY      Social History   Social History  . Marital status: Married    Spouse name: N/A  . Number of children: N/A  . Years of education: N/A   Occupational History  . RETAIL    Social History Main Topics  . Smoking status: Former Smoker    Packs/day: 1.00    Types: Cigarettes    Quit date: 03/22/2017  . Smokeless tobacco: Never Used  . Alcohol use 0.0 oz/week     Comment: occasional - BEER OR WINE  . Drug use: No  . Sexual activity: Not on file   Other Topics Concern  . 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     Family History  Problem Relation Age of Onset  . Coronary artery disease Father        brothers, sisters  . Heart disease Father   . Diabetes Mother   . Heart disease Sister   . Heart disease Sister   . Heart disease Brother   . Anesthesia problems Neg Hx     Current Outpatient Prescriptions  Medication Sig Dispense Refill  . albuterol (PROVENTIL HFA;VENTOLIN HFA) 108 (90 Base) MCG/ACT inhaler  Inhale 2 puffs into the lungs every 4 (four) hours as needed for wheezing or shortness of breath (cough, shortness of breath or wheezing.). 1 Inhaler 0  . amLODipine (NORVASC) 10 MG tablet TAKE 1 TABLET (10 MG TOTAL) BY MOUTH DAILY. 90 tablet 3  . aspirin EC 81 MG tablet Take 81 mg by mouth daily.    . Bilberry 1000 MG CAPS Take 1,000 mg by mouth daily.     . cetirizine (ZYRTEC) 10 MG tablet Take 10 mg by mouth daily.     . cilostazol (PLETAL) 100 MG tablet TAKE 1 TABLET BY MOUTH TWICE DAILY. TAKE 30 MINUTES BEFORE OR 2 HOURS AFTER FOOD. 60 tablet 0  . co-enzyme Q-10 30 MG capsule Take 30 mg by mouth daily.     . diphenhydramine-acetaminophen (TYLENOL PM EXTRA STRENGTH) 25-500 MG TABS Take 2 tablets by mouth at bedtime.     Marland Kitchen lisinopril  (PRINIVIL,ZESTRIL) 5 MG tablet Take 1 tablet (5 mg total) by mouth daily. 90 tablet 0  . LIVALO 1 MG TABS TAKE 1 TABLET BY MOUTH DAILY. 90 tablet 0  . LUTEIN PO Take 20 mg by mouth daily.     . Magnesium 250 MG TABS Take 250 mg by mouth daily.    . metoprolol succinate (TOPROL-XL) 50 MG 24 hr tablet TAKE 1 TABLET BY MOUTH DAILY. TAKE WITH OR IMMEDIATELY FOLLOWING A MEAL. 90 tablet 1  . Multiple Vitamins-Minerals (MULTIVITAMIN ADULTS PO) Take 1 tablet by mouth daily. VitaFusion    . naproxen sodium (ANAPROX) 220 MG tablet Take 220 mg by mouth 2 (two) times daily as needed (for pain.).     Marland Kitchen omeprazole (PRILOSEC) 20 MG capsule TAKE 1 CAPSULE (20 MG TOTAL) BY MOUTH DAILY. 30 capsule 0   No current facility-administered medications for this visit.     Allergies  Allergen Reactions  . Codeine Nausea And Vomiting    REVIEW OF SYSTEMS (negative unless checked):   Cardiac:   Chest pain or chest pressure?  Shortness of breath upon activity?  Shortness of breath when lying flat?  Irregular heart rhythm?  Vascular:   Pain in calf, thigh, or hip brought on by walking?  Pain in feet at night that wakes you up from your sleep?  Blood clot in your veins?  Leg swelling?  Pulmonary:   Oxygen at home?  Productive cough?  Wheezing?  Neurologic:   Sudden weakness in arms or legs?  Sudden numbness in arms or legs?  Sudden onset of difficult speaking or slurred speech?  Temporary loss of vision in one eye?  Problems with dizziness?  Gastrointestinal:   Blood in stool?  Vomited blood?  Genitourinary:   Burning when urinating?  Blood in urine?  Psychiatric:   Major depression  Hematologic:   Bleeding problems?  Problems with blood clotting?  Dermatologic:   Rashes or ulcers?  Constitutional:   Fever or chills?  Ear/Nose/Throat:   Change in hearing?  Nose bleeds?  Sore throat?  Musculoskeletal:   Back pain?   Joint pain?  Muscle pain?   For VQI Use Only   PRE-ADM LIVING Home  AMB STATUS Ambulatory  CAD Sx History of MI, but no symptoms No MI within 6 months  PRIOR CHF None  STRESS TEST No   Physical Examination     Vitals:   06/18/17 1007 06/18/17 1014  BP: (!) 172/81 (!) 170/82  Pulse: (!) 58   Resp: 20   Temp: 97.8 F (36.6 C)   TempSrc:  Oral   SpO2: 97%   Weight: 230 lb 14.4 oz (104.7 kg)   Height: 5\' 10"  (1.778 m)    Body mass index is 33.13 kg/m.  General Alert, O x 3, WD, NAD  Head [place]/AT,    Ear/Nose/ Throat Hearing grossly intact, nares without erythema or drainage, oropharynx without Erythema or Exudate, Mallampati score: 3,   Eyes PERRLA, EOMI,    Neck Supple, mid-line trachea,    Pulmonary Sym exp, good B air movt, CTA B  Cardiac RRR, Nl S1, S2, no Murmurs, No rubs, No S3,S4  Vascular Vessel Right Left  Radial Palpable Palpable  Brachial Palpable Palpable  Carotid Palpable, No Bruit Palpable, No Bruit  Aorta Not palpable N/A  Femoral Palpable Palpable  Popliteal Palpable Prominently palpable  PT Not palpable Not palpable  DP Not palpable Not palpable    Gastro- intestinal soft, non-distended, non-tender to palpation, No guarding or rebound, no HSM, no masses, no CVAT B, No palpable prominent aortic pulse,    Musculo- skeletal M/S 5/5 throughout  , Extremities without ischemic changes  , No edema present, Varicosities present: B small, No Lipodermatosclerosis present  Neurologic Cranial nerves 2-12 intact , Pain and light touch intact in extremities , Motor exam as listed above  Psychiatric Judgement intact, Mood & affect appropriate for pt's clinical situation  Dermatologic See M/S exam for extremity exam, No rashes otherwise noted  Lymphatic  Palpable lymph nodes: None    Non-Invasive Vascular imaging   Aortogram, bilateral leg runoff (04/28/17) by Dr. Kirke Corin:    Based on my review of this patient angio, he is a candidate for R CFA to AK pop bypass  with runoff via peroneal artery.  L leg runoff appears to be intact with peroneal runoff also.   Outside Studies/Documentation   4 pages of outside documents were reviewed including: outpatient Cardiology chart.    Medical Decision Making   EULIE GODA is a 66 y.o. male who presents with: R>L occupation limiting intermittent claudication.   Based on this patient's history and physical exam, I recommend: R CFA to AK pop BPG.  This patient needs cardiac preop optimization and risk stratification.  He will also need BLE GSV mapping for evaluation for a conduit.  Reportedly R GSV was harvested previously.  I discussed in depth with the patient the nature of atherosclerosis, and emphasized the importance of maximal medical management including strict control of blood pressure, blood glucose, and lipid levels, antiplatelet agent, obtaining regular exercise, and cessation of smoking.    The patient is aware that without maximal medical management the underlying atherosclerotic disease process will progress, limiting the benefit of any interventions.  The patient is going to resume swimming as his exercise once he recovers from surgery. The patient is currently not on a statin.  I suspect Cardiology will restart him on statin upon follow-up.  The patient is currently on an anti-platelet: ASA.  Hopefully all these evaluation and studies can be completed in the next 2-3 weeks.  Shortly after follow-up pt will be scheduled for the OR.  Thank you for allowing [place] to participate in this patient's care.   Dale Sake, MD, FACS [facility] Office: [phone] Pager: [phone]  06/18/2017, 9:49 AM

## 2017-06-21 ENCOUNTER — Telehealth: Payer: Self-pay | Admitting: Cardiovascular Disease

## 2017-06-21 ENCOUNTER — Telehealth: Payer: Self-pay | Admitting: Vascular Surgery

## 2017-06-21 NOTE — Telephone Encounter (Signed)
Pt already had a GSV map sched for 06/25/17 at 1:00. Sched MD appt at 1:45. Lm on hm#.

## 2017-06-21 NOTE — Telephone Encounter (Signed)
   Chart reviewed as part of pre-operative protocol coverage. Given past medical history and time since last visit, based on ACC/AHA guidelines, [name] would be at acceptable risk for the planned procedure without further cardiovascular testing.   Patient was seen by Dr. Kirke Corin 06/11/17. Per note "don't think he requires further stress testing before surgery".   [place], [place] 06/21/2017, 2:49 PM

## 2017-06-21 NOTE — Telephone Encounter (Signed)
-----   Message from Sharee Pimple, RN sent at 06/21/2017  8:54 AM EDT ----- Regarding: see this week w/ preop labs   ----- Message ----- From: Fransisco Hertz, MD Sent: 06/21/2017   8:35 AM To: [address]  [name] [id number] Nov 03, 1950  Dr. Kirke Corin thinks patient can proceed.  Reschedule patient for follow up this week for BLE GSV mapping.  Will discuss proceeding with R fem-pop BPG at appt this Friday.

## 2017-06-21 NOTE — Telephone Encounter (Signed)
New message        [facility] Pre-operative Risk Assessment    Request for surgical clearance:  1. What type of surgery is being performed? Rt Sem pop bypass  2. When is this surgery scheduled?  Not scheduled  3. Are there any medications that need to be held prior to surgery and how long? none  4. Practice name and name of physician performing surgery?  Dr Bridgett Larsson    5. What is your office phone and fax number?  [phone] fax [phone]  6. Anesthesia type (None, local, MAC, general) ?  general   Howie Ill 06/21/2017, 11:47 AM  _________________________________________________________________   (provider comments below)

## 2017-06-22 NOTE — Addendum Note (Signed)
Addended by: Burton Apley A on: 06/22/2017 09:34 AM   Modules accepted: Orders

## 2017-06-22 NOTE — Progress Notes (Signed)
History of Present Illness   [name] is a 66 y.o. (01-Jun-1951) male  who presents with chief complaint: R>L leg pain.  Onset of symptom occurred one year ago without obvious trigger.  Pain is described as cramping and heaviness, severity 3-6/10, and associated with walking.  His intermittent claudication start with walking <1/2 block.  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  Patient has attempted to treat this pain with rest and angiogram.  The patient has no rest pain symptoms also and no leg wounds/ulcers.    Pt has recently been cleared by Cardiology to proceed with R CFA to AK pop bypass.  Pt returns today for preop counseling and BLE GSV maping  Atherosclerotic risk factors include: HLD, HTN, and prior recent smoker.   Past Medical History:  Diagnosis Date  . Arthritis   . Asthma    occ bronchitis  . COPD (chronic obstructive pulmonary disease) (HCC)   . Coronary artery disease    a. s/p multiple prior PCIs; b. s/p CABG 06/2006 (L-LAD, S-Int and OM, S-PDA);  c. ETT-Myoview 4/13: low risk, no ischemia, EF 53%  . GERD (gastroesophageal reflux disease)   . Hx of echocardiogram    Echo 10/2006: normal LVF  . Hyperlipidemia   . Hypertension   . Myocardial infarction (HCC)   . Seasonal allergies     Past Surgical History:  Procedure Laterality Date  . ABDOMINAL AORTOGRAM N/A 04/28/2017   Procedure: ABDOMINAL AORTOGRAM;  Surgeon: Iran Ouch, MD;  Location: [facility]VASIVE [facility];  Service: Cardiovascular;  Laterality: N/A;  . ACNE CYST REMOVAL    . CARDIAC CATHETERIZATION    . COLONOSCOPY    . CORONARY ARTERY BYPASS GRAFT  2007  . CORONARY STENT PLACEMENT     x5 prior to cabg  . KNEE ARTHROSCOPY     rt knee  . LOWER EXTREMITY ANGIOGRAPHY Bilateral 04/28/2017   Procedure: Lower Extremity Angiography;  Surgeon: Iran Ouch, MD;  Location: [facility] INVASIVE CV LAB;   Service: Cardiovascular;  Laterality: Bilateral;  . SHOULDER ARTHROSCOPY  12/14/2011   removed bone spurs and repositioned muscle.   . TONSILLECTOMY      Social History   Social History  . Marital status: Married    Spouse name: N/A  . Number of children: N/A  . Years of education: N/A   Occupational History  . RETAIL    Social History Main Topics  . Smoking status: Former Smoker    Packs/day: 1.00    Types: Cigarettes    Quit date: 03/22/2017  . Smokeless tobacco: Never Used  . Alcohol use 0.0 oz/week     Comment: occasional - BEER OR WINE  . Drug use: No  . Sexual activity: Not on file   Other Topics Concern  . 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    Family History  Problem Relation Age of Onset  . Coronary artery disease Father        brothers, sisters  . Heart disease Father   . Diabetes Mother   . Heart disease Sister   . Heart disease Sister   . Heart disease Brother   . Anesthesia problems Neg Hx     Current Outpatient Prescriptions  Medication Sig Dispense Refill  .  albuterol (PROVENTIL HFA;VENTOLIN HFA) 108 (90 Base) MCG/ACT inhaler Inhale 2 puffs into the lungs every 4 (four) hours as needed for wheezing or shortness of breath (cough, shortness of breath or wheezing.). 1 Inhaler 0  . amLODipine (NORVASC) 10 MG tablet TAKE 1 TABLET (10 MG TOTAL) BY MOUTH DAILY. 90 tablet 3  . aspirin EC 81 MG tablet Take 81 mg by mouth daily.    . Bilberry 1000 MG CAPS Take 1,000 mg by mouth daily.     . cetirizine (ZYRTEC) 10 MG tablet Take 10 mg by mouth daily.     . cilostazol (PLETAL) 100 MG tablet TAKE 1 TABLET BY MOUTH TWICE DAILY. TAKE 30 MINUTES BEFORE OR 2 HOURS AFTER FOOD. 60 tablet 0  . co-enzyme Q-10 30 MG capsule Take 30 mg by mouth daily.     . diphenhydramine-acetaminophen (TYLENOL PM EXTRA STRENGTH) 25-500 MG TABS Take 2 tablets by mouth at bedtime.     Marland Kitchen  lisinopril (PRINIVIL,ZESTRIL) 5 MG tablet Take 1 tablet (5 mg total) by mouth daily. 90 tablet 0  . LIVALO 1 MG TABS TAKE 1 TABLET BY MOUTH DAILY. 90 tablet 0  . LUTEIN PO Take 20 mg by mouth daily.     . Magnesium 250 MG TABS Take 250 mg by mouth daily.    . metoprolol succinate (TOPROL-XL) 50 MG 24 hr tablet TAKE 1 TABLET BY MOUTH DAILY. TAKE WITH OR IMMEDIATELY FOLLOWING A MEAL. 90 tablet 1  . Multiple Vitamins-Minerals (MULTIVITAMIN ADULTS PO) Take 1 tablet by mouth daily. VitaFusion    . naproxen sodium (ANAPROX) 220 MG tablet Take 220 mg by mouth 2 (two) times daily as needed (for pain.).     Marland Kitchen omeprazole (PRILOSEC) 20 MG capsule TAKE 1 CAPSULE (20 MG TOTAL) BY MOUTH DAILY. 30 capsule 0   No current facility-administered medications for this visit.      Allergies  Allergen Reactions  . Codeine Nausea And Vomiting    REVIEW OF SYSTEMS (negative unless checked):   Cardiac:   Chest pain or chest pressure?  Shortness of breath upon activity?  Shortness of breath when lying flat?  Irregular heart rhythm?  Vascular:   Pain in calf, thigh, or hip brought on by walking?  Pain in feet at night that wakes you up from your sleep?  Blood clot in your veins?  Leg swelling?  Pulmonary:   Oxygen at home?  Productive cough?  Wheezing?  Neurologic:   Sudden weakness in arms or legs?  Sudden numbness in arms or legs?  Sudden onset of difficult speaking or slurred speech?  Temporary loss of vision in one eye?  Problems with dizziness?  Gastrointestinal:   Blood in stool?  Vomited blood?  Genitourinary:   Burning when urinating?  Blood in urine?  Psychiatric:   Major depression  Hematologic:   Bleeding problems?  Problems with blood clotting?  Dermatologic:   Rashes or ulcers?  Constitutional:   Fever or chills?  Ear/Nose/Throat:   Change in hearing?  Nose bleeds?  Sore  throat?  Musculoskeletal:   Back pain?  Joint pain?  Muscle pain?   For VQI Use Only   PRE-ADM LIVING Home  AMB STATUS Ambulatory  CAD Sx History of MI, but no symptoms No MI within 6 months  PRIOR CHF None  STRESS TEST No   Physical Examination   Vitals:   06/25/17 1350  BP: 120/64  Pulse: (!) 55  Resp: 20  Temp: (!) 97.4 F (36.3 C)  TempSrc: Oral  SpO2: 97%  Weight: 230 lb (104.3 kg)  Height:  (1.778 m)   Body mass index is 33 kg/m.  General Alert, O x 3, WD, NAD  Head [place]/AT,    Ear/Nose/ Throat Hearing grossly intact, nares without erythema or drainage, oropharynx without Erythema or Exudate, Mallampati score: 3,   Eyes PERRLA, EOMI,    Neck Supple, mid-line trachea,    Pulmonary Sym exp, good B air movt, CTA B  Cardiac RRR, Nl S1, S2, no Murmurs, No rubs, No S3,S4  Vascular Vessel Right Left  Radial Palpable Palpable  Brachial Palpable Palpable  Carotid Palpable, No Bruit Palpable, No Bruit  Aorta Not palpable N/A  Femoral Palpable Palpable  Popliteal Palpable Prominently palpable  PT Not palpable Not palpable  DP Not palpable Not palpable    Gastro- intestinal soft, non-distended, non-tender to palpation, No guarding or rebound, no HSM, no masses, no CVAT B, No palpable prominent aortic pulse,    Musculo- skeletal M/S 5/5 throughout  , Extremities without ischemic changes  , No edema present, Varicosities present: B small, No Lipodermatosclerosis present  Neurologic Cranial nerves 2-12 intact , Pain and light touch intact in extremities , Motor exam as listed above  Psychiatric Judgement intact, Mood & affect appropriate for pt's clinical situation  Dermatologic See M/S exam for extremity exam, No rashes otherwise noted  Lymphatic  Palpable lymph nodes: None    Non-Invasive Vascular imaging   B GSV Mapping (06/25/2017) Adequate L GSV, inadequate R GSV (harvested)   Outside Studies/Documentation   4 pages of outside  documents were reviewed including: outpatient Cardiology chart.   Medical Decision Making   JAYMOND WAAGE is a 66 y.o. (09-Apr-1951) male who presents with: R>L occupation limiting intermittent claudication.   I have offered the patient R CFA to AK vs BK pop bypass on 22 OCT 18. The risk, benefits, and alternative for bypass operations were discussed with the patient.   The patient is aware the risks include but are not limited to: bleeding, infection, myocardial infarction, stroke, limb loss, nerve damage, limb edema, need for additional procedures in the future, wound complications, and inability to complete the bypass.  The patient is aware of these risks and agreed to proceed.  I discussed in depth with the patient the nature of atherosclerosis, and emphasized the importance of maximal medical management including strict control of blood pressure, blood glucose, and lipid levels, antiplatelet agent, obtaining regular exercise, and cessation of smoking.    The patient is aware that without maximal medical management the underlying atherosclerotic disease process will progress, limiting the benefit of any interventions.  The patient is going to resume swimming as his exercise once he recovers from surgery.  The patient is currently not on a statin.  Will start Lipitor 10 mg PO daily.  The patient is currently on an anti-platelet: ASA.   Thank you for allowing [place] to participate in this patient's care.   [name], MD, FACS [facility] Office: [phone] Pager: [phone]

## 2017-06-24 ENCOUNTER — Encounter: Payer: Self-pay | Admitting: Vascular Surgery

## 2017-06-25 ENCOUNTER — Encounter: Payer: Self-pay | Admitting: *Deleted

## 2017-06-25 ENCOUNTER — Ambulatory Visit (INDEPENDENT_AMBULATORY_CARE_PROVIDER_SITE_OTHER): Payer: 59 | Admitting: Vascular Surgery

## 2017-06-25 ENCOUNTER — Encounter: Payer: Self-pay | Admitting: Vascular Surgery

## 2017-06-25 ENCOUNTER — Other Ambulatory Visit: Payer: Self-pay | Admitting: *Deleted

## 2017-06-25 ENCOUNTER — Encounter (HOSPITAL_COMMUNITY): Payer: Self-pay | Admitting: *Deleted

## 2017-06-25 ENCOUNTER — Ambulatory Visit (INDEPENDENT_AMBULATORY_CARE_PROVIDER_SITE_OTHER)
Admission: RE | Admit: 2017-06-25 | Discharge: 2017-06-25 | Disposition: A | Discharge status: Home / Self Care | Payer: 59 | Source: Ambulatory Visit | Attending: Vascular Surgery | Admitting: Vascular Surgery

## 2017-06-25 VITALS — BP 120/64 | HR 55 | Temp 97.4°F | Resp 20 | Ht 70.0 in | Wt 230.0 lb

## 2017-06-25 DIAGNOSIS — Z87891 Personal history of nicotine dependence: Secondary | ICD-10-CM | POA: Diagnosis not present

## 2017-06-25 DIAGNOSIS — I252 Old myocardial infarction: Secondary | ICD-10-CM | POA: Diagnosis not present

## 2017-06-25 DIAGNOSIS — I2510 Atherosclerotic heart disease of native coronary artery without angina pectoris: Secondary | ICD-10-CM | POA: Diagnosis not present

## 2017-06-25 DIAGNOSIS — I70213 Atherosclerosis of native arteries of extremities with intermittent claudication, bilateral legs: Secondary | ICD-10-CM

## 2017-06-25 DIAGNOSIS — K219 Gastro-esophageal reflux disease without esophagitis: Secondary | ICD-10-CM | POA: Diagnosis not present

## 2017-06-25 DIAGNOSIS — I10 Essential (primary) hypertension: Secondary | ICD-10-CM | POA: Diagnosis not present

## 2017-06-25 DIAGNOSIS — Z951 Presence of aortocoronary bypass graft: Secondary | ICD-10-CM | POA: Diagnosis not present

## 2017-06-25 DIAGNOSIS — I739 Peripheral vascular disease, unspecified: Secondary | ICD-10-CM | POA: Diagnosis not present

## 2017-06-25 DIAGNOSIS — E785 Hyperlipidemia, unspecified: Secondary | ICD-10-CM | POA: Diagnosis not present

## 2017-06-25 DIAGNOSIS — J449 Chronic obstructive pulmonary disease, unspecified: Secondary | ICD-10-CM | POA: Diagnosis not present

## 2017-06-25 NOTE — Progress Notes (Signed)
Pt SDW-pre-op call completed by both pt and spouse, [name] (DPR), via speaker phone. Pt denies any acute cardiopulmonary issues. Pt under the care of Dr. Kirke Corin and Dr. [name], Cardiology. Pt denies having a chest x ray within the last year. Pt denies recent labs. Pt made aware to stop taking vitamins, fish oil, CO Q 10, Melatonin, Bilberry, Lutein and herbal medications. Do not take any NSAIDs ie: Ibuprofen, Advil, Naproxen (Aleve), Motrin, BC and Goody Powder. Pt was instructed by surgeons office to take last dose of Pletal today. Spouse verbalized understanding of all pre-op instructions.

## 2017-06-27 NOTE — Anesthesia Preprocedure Evaluation (Signed)
Anesthesia Evaluation  Patient identified by MRN, date of birth, ID band Patient awake    Reviewed: Allergy & Precautions, H&P , NPO status , Patient's Chart, lab work & pertinent test results  Airway Mallampati: II  TM Distance: <3 FB Neck ROM: Full    Dental no notable dental hx. (+) Edentulous Upper, Edentulous Lower   Pulmonary COPD, former smoker,    Pulmonary exam normal breath sounds clear to auscultation       Cardiovascular hypertension, + CAD, + Past MI, + CABG and + Peripheral Vascular Disease  Normal cardiovascular exam Rhythm:Regular Rate:Normal  EF 60%   Neuro/Psych negative neurological ROS  negative psych ROS   GI/Hepatic Neg liver ROS, GERD  Controlled,  Endo/Other  negative endocrine ROS  Renal/GU negative Renal ROS     Musculoskeletal  (+) Arthritis ,   Abdominal   Peds  Hematology negative hematology ROS (+)   Anesthesia Other Findings   Reproductive/Obstetrics negative OB ROS                             Anesthesia Physical  Anesthesia Plan  ASA: III  Anesthesia Plan: General   Post-op Pain Management:    Induction: Intravenous  PONV Risk Score and Plan: 2 and Ondansetron and Dexamethasone  Airway Management Planned: Oral ETT  Additional Equipment: Arterial line  Intra-op Plan:   Post-operative Plan: Possible Post-op intubation/ventilation  Informed Consent: I have reviewed the patients History and Physical, chart, labs and discussed the procedure including the risks, benefits and alternatives for the proposed anesthesia with the patient or authorized representative who has indicated his/her understanding and acceptance.   Dental advisory given  Plan Discussed with: CRNA  Anesthesia Plan Comments:         Anesthesia Quick Evaluation

## 2017-06-28 ENCOUNTER — Encounter (HOSPITAL_COMMUNITY): Payer: Self-pay | Admitting: *Deleted

## 2017-06-28 ENCOUNTER — Inpatient Hospital Stay (HOSPITAL_COMMUNITY): Payer: 59 | Admitting: Anesthesiology

## 2017-06-28 ENCOUNTER — Encounter (HOSPITAL_COMMUNITY)
Admission: RE | Disposition: A | Discharge status: Home Health | Payer: Self-pay | Source: Ambulatory Visit | Attending: Vascular Surgery

## 2017-06-28 ENCOUNTER — Inpatient Hospital Stay (HOSPITAL_COMMUNITY)
Admission: RE | Admit: 2017-06-28 | Discharge: 2017-07-02 | DRG: 254 | Disposition: A | Discharge status: Home Health | Payer: 59 | Source: Ambulatory Visit | Attending: Vascular Surgery | Admitting: Vascular Surgery

## 2017-06-28 DIAGNOSIS — Z951 Presence of aortocoronary bypass graft: Secondary | ICD-10-CM

## 2017-06-28 DIAGNOSIS — Z885 Allergy status to narcotic agent status: Secondary | ICD-10-CM

## 2017-06-28 DIAGNOSIS — E785 Hyperlipidemia, unspecified: Secondary | ICD-10-CM | POA: Diagnosis present

## 2017-06-28 DIAGNOSIS — I2510 Atherosclerotic heart disease of native coronary artery without angina pectoris: Secondary | ICD-10-CM | POA: Diagnosis present

## 2017-06-28 DIAGNOSIS — G8918 Other acute postprocedural pain: Secondary | ICD-10-CM | POA: Diagnosis present

## 2017-06-28 DIAGNOSIS — Z8249 Family history of ischemic heart disease and other diseases of the circulatory system: Secondary | ICD-10-CM | POA: Diagnosis not present

## 2017-06-28 DIAGNOSIS — I70213 Atherosclerosis of native arteries of extremities with intermittent claudication, bilateral legs: Secondary | ICD-10-CM | POA: Diagnosis not present

## 2017-06-28 DIAGNOSIS — K219 Gastro-esophageal reflux disease without esophagitis: Secondary | ICD-10-CM | POA: Diagnosis present

## 2017-06-28 DIAGNOSIS — Z7982 Long term (current) use of aspirin: Secondary | ICD-10-CM

## 2017-06-28 DIAGNOSIS — Z87891 Personal history of nicotine dependence: Secondary | ICD-10-CM | POA: Diagnosis not present

## 2017-06-28 DIAGNOSIS — R55 Syncope and collapse: Secondary | ICD-10-CM | POA: Diagnosis not present

## 2017-06-28 DIAGNOSIS — Z79899 Other long term (current) drug therapy: Secondary | ICD-10-CM | POA: Diagnosis not present

## 2017-06-28 DIAGNOSIS — Z955 Presence of coronary angioplasty implant and graft: Secondary | ICD-10-CM | POA: Diagnosis not present

## 2017-06-28 DIAGNOSIS — I70211 Atherosclerosis of native arteries of extremities with intermittent claudication, right leg: Secondary | ICD-10-CM | POA: Diagnosis not present

## 2017-06-28 DIAGNOSIS — J449 Chronic obstructive pulmonary disease, unspecified: Secondary | ICD-10-CM | POA: Diagnosis present

## 2017-06-28 DIAGNOSIS — I10 Essential (primary) hypertension: Secondary | ICD-10-CM | POA: Diagnosis not present

## 2017-06-28 DIAGNOSIS — I739 Peripheral vascular disease, unspecified: Secondary | ICD-10-CM | POA: Diagnosis not present

## 2017-06-28 DIAGNOSIS — I252 Old myocardial infarction: Secondary | ICD-10-CM

## 2017-06-28 HISTORY — DX: Bronchitis, not specified as acute or chronic: J40

## 2017-06-28 HISTORY — DX: Presence of dental prosthetic device (complete) (partial): Z97.2

## 2017-06-28 HISTORY — DX: Peripheral vascular disease, unspecified: I73.9

## 2017-06-28 HISTORY — DX: Presence of spectacles and contact lenses: Z97.3

## 2017-06-28 HISTORY — PX: FEMORAL-POPLITEAL BYPASS GRAFT: SHX937

## 2017-06-28 LAB — COMPREHENSIVE METABOLIC PANEL
ALBUMIN: 3.6 g/dL (ref 3.5–5.0)
ALT: 21 U/L (ref 17–63)
ANION GAP: 9 (ref 5–15)
AST: 17 U/L (ref 15–41)
Alkaline Phosphatase: 88 U/L (ref 38–126)
BILIRUBIN TOTAL: 0.7 mg/dL (ref 0.3–1.2)
BUN: 20 mg/dL (ref 6–20)
CO2: 24 mmol/L (ref 22–32)
Calcium: 9.1 mg/dL (ref 8.9–10.3)
Chloride: 103 mmol/L (ref 101–111)
Creatinine, Ser: 1.18 mg/dL (ref 0.61–1.24)
GFR calc Af Amer: >60 mL/min (ref >60)
GFR calc non Af Amer: >60 mL/min (ref >60)
Glucose, Bld: 122 mg/dL — ABNORMAL HIGH (ref 65–99)
POTASSIUM: 4.1 mmol/L (ref 3.5–5.1)
SODIUM: 136 mmol/L (ref 135–145)
TOTAL PROTEIN: 6.9 g/dL (ref 6.5–8.1)

## 2017-06-28 LAB — CBC
HCT: 40.8 % (ref 39.0–52.0)
Hemoglobin: 13.6 g/dL (ref 13.0–17.0)
MCH: 29.3 pg (ref 26.0–34.0)
MCHC: 33.3 g/dL (ref 30.0–36.0)
MCV: 87.9 fL (ref 78.0–100.0)
PLATELETS: 196 10*3/uL (ref 150–400)
RBC: 4.64 MIL/uL (ref 4.22–5.81)
RDW: 14.2 % (ref 11.5–15.5)
WBC: 8.5 10*3/uL (ref 4.0–10.5)

## 2017-06-28 LAB — TYPE AND SCREEN
ABO/RH(D): O POS
ANTIBODY SCREEN: NEGATIVE

## 2017-06-28 LAB — PROTIME-INR
INR: 0.97
Prothrombin Time: 12.8 seconds (ref 11.4–15.2)

## 2017-06-28 LAB — SURGICAL PCR SCREEN
MRSA, PCR: NEGATIVE
Staphylococcus aureus: NEGATIVE

## 2017-06-28 LAB — APTT: APTT: 31 s (ref 24–36)

## 2017-06-28 SURGERY — BYPASS GRAFT FEMORAL-POPLITEAL ARTERY
Anesthesia: General | Laterality: Right

## 2017-06-28 MED ORDER — PHENYLEPHRINE HCL 10 MG/ML IJ SOLN: INTRAVENOUS | Status: DC | PRN

## 2017-06-28 MED ORDER — ASPIRIN EC 81 MG PO TBEC
81.0000 mg | DELAYED_RELEASE_TABLET | Freq: Every day | ORAL | Status: DC
  Administered 2017-06-29 – 2017-07-02 (×4): 81 mg via ORAL
  Filled 2017-06-28 (×5): qty 1

## 2017-06-28 MED ORDER — OXYCODONE HCL 5 MG PO TABS
ORAL_TABLET | ORAL | Status: AC
  Filled 2017-06-28: qty 1

## 2017-06-28 MED ORDER — ENOXAPARIN SODIUM 40 MG/0.4ML ~~LOC~~ SOLN
40.0000 mg | SUBCUTANEOUS | Status: DC
  Administered 2017-06-29 – 2017-07-02 (×4): 40 mg via SUBCUTANEOUS
  Filled 2017-06-28 (×4): qty 0.4

## 2017-06-28 MED ORDER — MUPIROCIN 2 % EX OINT
TOPICAL_OINTMENT | CUTANEOUS | Status: AC
  Filled 2017-06-28: qty 22

## 2017-06-28 MED ORDER — OXYCODONE HCL 5 MG PO TABS
5.0000 mg | ORAL_TABLET | Freq: Once | ORAL | Status: AC | PRN
  Administered 2017-06-28: 5 mg via ORAL

## 2017-06-28 MED ORDER — ROCURONIUM BROMIDE 100 MG/10ML IV SOLN
INTRAVENOUS | Status: DC | PRN
  Administered 2017-06-28: 60 mg via INTRAVENOUS
  Administered 2017-06-28: 40 mg via INTRAVENOUS

## 2017-06-28 MED ORDER — ALUM & MAG HYDROXIDE-SIMETH 200-200-20 MG/5ML PO SUSP: 15.0000 mL | ORAL | Status: DC | PRN

## 2017-06-28 MED ORDER — DEXTROSE 5 % IV SOLN
1.5000 g | Freq: Two times a day (BID) | INTRAVENOUS | Status: AC
  Administered 2017-06-28 – 2017-06-29 (×2): 1.5 g via INTRAVENOUS
  Filled 2017-06-28 (×2): qty 1.5

## 2017-06-28 MED ORDER — HYDROMORPHONE HCL 1 MG/ML IJ SOLN
INTRAMUSCULAR | Status: AC
  Filled 2017-06-28: qty 1

## 2017-06-28 MED ORDER — HEMOSTATIC AGENTS (NO CHARGE) OPTIME
TOPICAL | Status: DC | PRN
  Administered 2017-06-28: 1 via TOPICAL

## 2017-06-28 MED ORDER — CHLORHEXIDINE GLUCONATE CLOTH 2 % EX PADS: 6.0000 | MEDICATED_PAD | Freq: Once | CUTANEOUS | Status: DC

## 2017-06-28 MED ORDER — 0.9 % SODIUM CHLORIDE (POUR BTL) OPTIME
TOPICAL | Status: DC | PRN
  Administered 2017-06-28 (×3): 1000 mL

## 2017-06-28 MED ORDER — SODIUM CHLORIDE 0.9 % IV SOLN
INTRAVENOUS | Status: DC
  Administered 2017-06-28: 16:00:00 via INTRAVENOUS

## 2017-06-28 MED ORDER — ONDANSETRON HCL 4 MG/2ML IJ SOLN
INTRAMUSCULAR | Status: DC | PRN
  Administered 2017-06-28: 4 mg via INTRAVENOUS

## 2017-06-28 MED ORDER — LORATADINE 10 MG PO TABS
10.0000 mg | ORAL_TABLET | Freq: Every day | ORAL | Status: DC
  Administered 2017-06-29 – 2017-07-02 (×4): 10 mg via ORAL
  Filled 2017-06-28 (×5): qty 1

## 2017-06-28 MED ORDER — HYDROMORPHONE HCL 1 MG/ML IJ SOLN
0.2500 mg | INTRAMUSCULAR | Status: DC | PRN
  Administered 2017-06-28 (×2): 0.5 mg via INTRAVENOUS

## 2017-06-28 MED ORDER — MIDAZOLAM HCL 5 MG/5ML IJ SOLN
INTRAMUSCULAR | Status: DC | PRN
  Administered 2017-06-28: 2 mg via INTRAVENOUS

## 2017-06-28 MED ORDER — PROMETHAZINE HCL 25 MG/ML IJ SOLN: 6.2500 mg | INTRAMUSCULAR | Status: DC | PRN

## 2017-06-28 MED ORDER — OXYCODONE HCL 5 MG/5ML PO SOLN: 5.0000 mg | Freq: Once | ORAL | Status: AC | PRN

## 2017-06-28 MED ORDER — PROPOFOL 10 MG/ML IV BOLUS
INTRAVENOUS | Status: AC
  Filled 2017-06-28: qty 20

## 2017-06-28 MED ORDER — MEPERIDINE HCL 25 MG/ML IJ SOLN: 6.2500 mg | INTRAMUSCULAR | Status: DC | PRN

## 2017-06-28 MED ORDER — LIDOCAINE HCL (CARDIAC) 20 MG/ML IV SOLN
INTRAVENOUS | Status: DC | PRN
  Administered 2017-06-28: 100 mg via INTRAVENOUS

## 2017-06-28 MED ORDER — POLYETHYLENE GLYCOL 3350 17 G PO PACK
17.0000 g | PACK | Freq: Every day | ORAL | Status: DC | PRN
  Administered 2017-06-29: 17 g via ORAL
  Filled 2017-06-28: qty 1

## 2017-06-28 MED ORDER — SODIUM CHLORIDE 0.9 % IV SOLN
INTRAVENOUS | Status: DC | PRN
  Administered 2017-06-28: 07:00:00

## 2017-06-28 MED ORDER — DEXTROSE 5 % IV SOLN
INTRAVENOUS | Status: AC
  Filled 2017-06-28: qty 1.5

## 2017-06-28 MED ORDER — MIDAZOLAM HCL 2 MG/2ML IJ SOLN
INTRAMUSCULAR | Status: AC
  Filled 2017-06-28: qty 2

## 2017-06-28 MED ORDER — PHENYLEPHRINE HCL 10 MG/ML IJ SOLN
INTRAVENOUS | Status: DC | PRN
  Administered 2017-06-28: 50 ug/min via INTRAVENOUS

## 2017-06-28 MED ORDER — LISINOPRIL 5 MG PO TABS
5.0000 mg | ORAL_TABLET | Freq: Every day | ORAL | Status: DC
  Administered 2017-06-29 – 2017-07-02 (×4): 5 mg via ORAL
  Filled 2017-06-28 (×5): qty 1

## 2017-06-28 MED ORDER — FENTANYL CITRATE (PF) 250 MCG/5ML IJ SOLN
INTRAMUSCULAR | Status: AC
  Filled 2017-06-28: qty 5

## 2017-06-28 MED ORDER — LABETALOL HCL 5 MG/ML IV SOLN
INTRAVENOUS | Status: DC | PRN
  Administered 2017-06-28 (×2): 5 mg via INTRAVENOUS

## 2017-06-28 MED ORDER — METOPROLOL SUCCINATE ER 50 MG PO TB24
50.0000 mg | ORAL_TABLET | Freq: Every day | ORAL | Status: DC
  Administered 2017-06-29 – 2017-07-02 (×4): 50 mg via ORAL
  Filled 2017-06-28 (×5): qty 1

## 2017-06-28 MED ORDER — HYDRALAZINE HCL 20 MG/ML IJ SOLN: 5.0000 mg | INTRAMUSCULAR | Status: DC | PRN

## 2017-06-28 MED ORDER — LUTEIN 20 MG PO CAPS: 20.0000 mg | ORAL_CAPSULE | Freq: Every day | ORAL | Status: DC

## 2017-06-28 MED ORDER — PRAVASTATIN SODIUM 20 MG PO TABS: 20.0000 mg | ORAL_TABLET | Freq: Every day | ORAL | Status: DC

## 2017-06-28 MED ORDER — HEPARIN SODIUM (PORCINE) 1000 UNIT/ML IJ SOLN
INTRAMUSCULAR | Status: DC | PRN
  Administered 2017-06-28: 11000 [IU] via INTRAVENOUS
  Administered 2017-06-28: 2000 [IU] via INTRAVENOUS

## 2017-06-28 MED ORDER — MUPIROCIN 2 % EX OINT
1.0000 "application " | TOPICAL_OINTMENT | Freq: Once | CUTANEOUS | Status: AC
  Administered 2017-06-28: 1 via TOPICAL

## 2017-06-28 MED ORDER — CEFUROXIME SODIUM 1.5 G IV SOLR
1.5000 g | INTRAVENOUS | Status: AC
  Administered 2017-06-28 (×2): 1.5 g via INTRAVENOUS
  Filled 2017-06-28: qty 1.5

## 2017-06-28 MED ORDER — ACETAMINOPHEN 650 MG RE SUPP: 325.0000 mg | RECTAL | Status: DC | PRN

## 2017-06-28 MED ORDER — DOCUSATE SODIUM 100 MG PO CAPS
100.0000 mg | ORAL_CAPSULE | Freq: Every day | ORAL | Status: DC
  Administered 2017-06-29 – 2017-07-02 (×4): 100 mg via ORAL
  Filled 2017-06-28 (×4): qty 1

## 2017-06-28 MED ORDER — LACTATED RINGERS IV SOLN
INTRAVENOUS | Status: DC | PRN
  Administered 2017-06-28 (×3): via INTRAVENOUS

## 2017-06-28 MED ORDER — SUGAMMADEX SODIUM 200 MG/2ML IV SOLN
INTRAVENOUS | Status: DC | PRN
  Administered 2017-06-28: 225 mg via INTRAVENOUS

## 2017-06-28 MED ORDER — PANTOPRAZOLE SODIUM 40 MG PO TBEC
40.0000 mg | DELAYED_RELEASE_TABLET | Freq: Every day | ORAL | Status: DC
  Administered 2017-06-29 – 2017-07-02 (×4): 40 mg via ORAL
  Filled 2017-06-28 (×5): qty 1

## 2017-06-28 MED ORDER — CILOSTAZOL 100 MG PO TABS
100.0000 mg | ORAL_TABLET | Freq: Two times a day (BID) | ORAL | Status: DC
  Administered 2017-06-28 – 2017-07-02 (×8): 100 mg via ORAL
  Filled 2017-06-28 (×9): qty 1

## 2017-06-28 MED ORDER — AMLODIPINE BESYLATE 10 MG PO TABS
10.0000 mg | ORAL_TABLET | Freq: Every day | ORAL | Status: DC
  Administered 2017-06-29 – 2017-07-02 (×4): 10 mg via ORAL
  Filled 2017-06-28 (×5): qty 1

## 2017-06-28 MED ORDER — POTASSIUM CHLORIDE CRYS ER 20 MEQ PO TBCR: 20.0000 meq | EXTENDED_RELEASE_TABLET | Freq: Every day | ORAL | Status: DC | PRN

## 2017-06-28 MED ORDER — ADULT MULTIVITAMIN W/MINERALS CH
ORAL_TABLET | Freq: Every day | ORAL | Status: DC
  Administered 2017-06-29 – 2017-07-02 (×4): 1 via ORAL
  Filled 2017-06-28 (×5): qty 1

## 2017-06-28 MED ORDER — FENTANYL CITRATE (PF) 100 MCG/2ML IJ SOLN
INTRAMUSCULAR | Status: DC | PRN
  Administered 2017-06-28 (×5): 50 ug via INTRAVENOUS
  Administered 2017-06-28: 150 ug via INTRAVENOUS
  Administered 2017-06-28 (×2): 50 ug via INTRAVENOUS
  Administered 2017-06-28: 25 ug via INTRAVENOUS
  Administered 2017-06-28 (×2): 50 ug via INTRAVENOUS
  Administered 2017-06-28: 25 ug via INTRAVENOUS
  Administered 2017-06-28 (×3): 50 ug via INTRAVENOUS

## 2017-06-28 MED ORDER — ONDANSETRON HCL 4 MG/2ML IJ SOLN: 4.0000 mg | Freq: Four times a day (QID) | INTRAMUSCULAR | Status: DC | PRN

## 2017-06-28 MED ORDER — MAGNESIUM SULFATE 2 GM/50ML IV SOLN
2.0000 g | Freq: Every day | INTRAVENOUS | Status: DC | PRN
  Filled 2017-06-28: qty 50

## 2017-06-28 MED ORDER — DEXAMETHASONE SODIUM PHOSPHATE 4 MG/ML IJ SOLN
INTRAMUSCULAR | Status: DC | PRN
  Administered 2017-06-28: 10 mg via INTRAVENOUS

## 2017-06-28 MED ORDER — ACETAMINOPHEN 325 MG PO TABS: 325.0000 mg | ORAL_TABLET | ORAL | Status: DC | PRN

## 2017-06-28 MED ORDER — ATORVASTATIN CALCIUM 10 MG PO TABS
10.0000 mg | ORAL_TABLET | Freq: Every day | ORAL | Status: DC
  Administered 2017-06-28 – 2017-07-01 (×4): 10 mg via ORAL
  Filled 2017-06-28 (×4): qty 1

## 2017-06-28 MED ORDER — PHENYLEPHRINE HCL 10 MG/ML IJ SOLN
INTRAMUSCULAR | Status: DC | PRN
  Administered 2017-06-28 (×2): 80 ug via INTRAVENOUS

## 2017-06-28 MED ORDER — OXYCODONE HCL 5 MG PO TABS
5.0000 mg | ORAL_TABLET | ORAL | Status: DC | PRN
  Administered 2017-06-28 – 2017-07-02 (×19): 10 mg via ORAL
  Filled 2017-06-28 (×20): qty 2

## 2017-06-28 MED ORDER — FLEET ENEMA 7-19 GM/118ML RE ENEM: 1.0000 | ENEMA | Freq: Once | RECTAL | Status: DC | PRN

## 2017-06-28 MED ORDER — PHENOL 1.4 % MT LIQD: 1.0000 | OROMUCOSAL | Status: DC | PRN

## 2017-06-28 MED ORDER — LABETALOL HCL 5 MG/ML IV SOLN: 10.0000 mg | INTRAVENOUS | Status: DC | PRN

## 2017-06-28 MED ORDER — SODIUM CHLORIDE 0.9 % IV SOLN: INTRAVENOUS | Status: DC

## 2017-06-28 MED ORDER — ALBUTEROL SULFATE (2.5 MG/3ML) 0.083% IN NEBU: 2.5000 mg | INHALATION_SOLUTION | RESPIRATORY_TRACT | Status: DC | PRN

## 2017-06-28 MED ORDER — GUAIFENESIN-DM 100-10 MG/5ML PO SYRP: 15.0000 mL | ORAL_SOLUTION | ORAL | Status: DC | PRN

## 2017-06-28 MED ORDER — PROPOFOL 10 MG/ML IV BOLUS
INTRAVENOUS | Status: DC | PRN
  Administered 2017-06-28: 150 mg via INTRAVENOUS
  Administered 2017-06-28: 50 mg via INTRAVENOUS

## 2017-06-28 MED ORDER — SODIUM CHLORIDE 0.9 % IV SOLN: 500.0000 mL | Freq: Once | INTRAVENOUS | Status: DC | PRN

## 2017-06-28 MED ORDER — BISACODYL 10 MG RE SUPP: 10.0000 mg | Freq: Every day | RECTAL | Status: DC | PRN

## 2017-06-28 MED ORDER — MORPHINE SULFATE (PF) 2 MG/ML IV SOLN
2.0000 mg | INTRAVENOUS | Status: DC | PRN
  Administered 2017-06-28 – 2017-07-02 (×14): 2 mg via INTRAVENOUS
  Filled 2017-06-28 (×15): qty 1

## 2017-06-28 MED ORDER — METOPROLOL TARTRATE 5 MG/5ML IV SOLN: 2.0000 mg | INTRAVENOUS | Status: DC | PRN

## 2017-06-28 MED ORDER — EPHEDRINE SULFATE 50 MG/ML IJ SOLN
INTRAMUSCULAR | Status: DC | PRN
  Administered 2017-06-28: 10 mg via INTRAVENOUS

## 2017-06-28 SURGICAL SUPPLY — 73 items
BANDAGE ESMARK 6X9 LF (GAUZE/BANDAGES/DRESSINGS) IMPLANT
BNDG ESMARK 6X9 LF (GAUZE/BANDAGES/DRESSINGS)
CANISTER SUCT 3000ML PPV (MISCELLANEOUS) ×3 IMPLANT
CANNULA VESSEL 3MM 2 BLNT TIP (CANNULA) ×3 IMPLANT
CLIP FOGARTY SPRING 6M (CLIP) ×3 IMPLANT
CLIP VESOCCLUDE MED 24/CT (CLIP) ×3 IMPLANT
CLIP VESOCCLUDE SM WIDE 24/CT (CLIP) ×3 IMPLANT
COVER PROBE W GEL 5X96 (DRAPES) ×3 IMPLANT
CUFF TOURNIQUET SINGLE 24IN (TOURNIQUET CUFF) IMPLANT
CUFF TOURNIQUET SINGLE 34IN LL (TOURNIQUET CUFF) IMPLANT
CUFF TOURNIQUET SINGLE 44IN (TOURNIQUET CUFF) IMPLANT
DERMABOND ADVANCED (GAUZE/BANDAGES/DRESSINGS) ×2
DERMABOND ADVANCED .7 DNX12 (GAUZE/BANDAGES/DRESSINGS) ×1 IMPLANT
DRAIN CHANNEL 15F RND FF W/TCR (WOUND CARE) ×3 IMPLANT
DRAPE C-ARM 42X72 X-RAY (DRAPES) IMPLANT
DRAPE HALF SHEET 40X57 (DRAPES) IMPLANT
DRSG COVADERM 4X6 (GAUZE/BANDAGES/DRESSINGS) IMPLANT
DRSG COVADERM 4X8 (GAUZE/BANDAGES/DRESSINGS) ×6 IMPLANT
ELECT REM PT RETURN 9FT ADLT (ELECTROSURGICAL) ×6
ELECTRODE REM PT RTRN 9FT ADLT (ELECTROSURGICAL) ×2 IMPLANT
EVACUATOR SILICONE 100CC (DRAIN) ×3 IMPLANT
GAUZE SPONGE 4X4 12PLY STRL LF (GAUZE/BANDAGES/DRESSINGS) ×3 IMPLANT
GAUZE SPONGE 4X4 16PLY XRAY LF (GAUZE/BANDAGES/DRESSINGS) ×3 IMPLANT
GLOVE BIO SURGEON STRL SZ 6.5 (GLOVE) ×4 IMPLANT
GLOVE BIO SURGEON STRL SZ7 (GLOVE) ×6 IMPLANT
GLOVE BIO SURGEONS STRL SZ 6.5 (GLOVE) ×2
GLOVE BIOGEL PI IND STRL 6.5 (GLOVE) ×5 IMPLANT
GLOVE BIOGEL PI IND STRL 7.5 (GLOVE) ×2 IMPLANT
GLOVE BIOGEL PI IND STRL 8.5 (GLOVE) ×1 IMPLANT
GLOVE BIOGEL PI INDICATOR 6.5 (GLOVE) ×10
GLOVE BIOGEL PI INDICATOR 7.5 (GLOVE) ×4
GLOVE BIOGEL PI INDICATOR 8.5 (GLOVE) ×2
GLOVE SS BIOGEL STRL SZ 8 (GLOVE) ×1 IMPLANT
GLOVE SUPERSENSE BIOGEL SZ 8 (GLOVE) ×2
GLOVE SURG SS PI 6.5 STRL IVOR (GLOVE) ×3 IMPLANT
GOWN STRL NON-REIN LRG LVL3 (GOWN DISPOSABLE) ×3 IMPLANT
GOWN STRL REUS W/ TWL LRG LVL3 (GOWN DISPOSABLE) ×3 IMPLANT
GOWN STRL REUS W/ TWL XL LVL3 (GOWN DISPOSABLE) ×1 IMPLANT
GOWN STRL REUS W/TWL LRG LVL3 (GOWN DISPOSABLE) ×6
GOWN STRL REUS W/TWL XL LVL3 (GOWN DISPOSABLE) ×2
HEMOSTAT SPONGE AVITENE ULTRA (HEMOSTASIS) ×3 IMPLANT
INSERT FOGARTY SM (MISCELLANEOUS) IMPLANT
KIT BASIN OR (CUSTOM PROCEDURE TRAY) ×3 IMPLANT
KIT ROOM TURNOVER OR (KITS) ×3 IMPLANT
MARKER GRAFT CORONARY BYPASS (MISCELLANEOUS) IMPLANT
NS IRRIG 1000ML POUR BTL (IV SOLUTION) ×9 IMPLANT
PACK PERIPHERAL VASCULAR (CUSTOM PROCEDURE TRAY) ×3 IMPLANT
PAD ARMBOARD 7.5X6 YLW CONV (MISCELLANEOUS) ×3 IMPLANT
SET MICROPUNCTURE 5F STIFF (MISCELLANEOUS) IMPLANT
SOLUTION BETADINE 4OZ (MISCELLANEOUS) ×3 IMPLANT
STAPLER VISISTAT 35W (STAPLE) ×9 IMPLANT
STOPCOCK 4 WAY LG BORE MALE ST (IV SETS) IMPLANT
SUT ETHILON 3 0 PS 1 (SUTURE) ×3 IMPLANT
SUT GORETEX 5 0 TT13 24 (SUTURE) IMPLANT
SUT GORETEX 6.0 TT13 (SUTURE) IMPLANT
SUT MNCRL AB 4-0 PS2 18 (SUTURE) ×6 IMPLANT
SUT PROLENE 5 0 C 1 24 (SUTURE) ×12 IMPLANT
SUT PROLENE 6 0 BV (SUTURE) ×9 IMPLANT
SUT PROLENE 7 0 BV 1 (SUTURE) ×3 IMPLANT
SUT PROLENE 7 0 BV1 MDA (SUTURE) ×3 IMPLANT
SUT SILK 2 0 FS (SUTURE) ×3 IMPLANT
SUT SILK 3 0 (SUTURE) ×2
SUT SILK 3-0 18XBRD TIE 12 (SUTURE) ×1 IMPLANT
SUT VIC AB 2-0 CT1 27 (SUTURE) ×6
SUT VIC AB 2-0 CT1 TAPERPNT 27 (SUTURE) ×3 IMPLANT
SUT VIC AB 3-0 SH 27 (SUTURE) ×12
SUT VIC AB 3-0 SH 27X BRD (SUTURE) ×6 IMPLANT
SUT VIC AB 4-0 PS2 27 (SUTURE) ×6 IMPLANT
TAPE UMBILICAL COTTON 1/8X30 (MISCELLANEOUS) ×3 IMPLANT
TRAY FOLEY W/METER SILVER 16FR (SET/KITS/TRAYS/PACK) ×3 IMPLANT
TUBING EXTENTION W/L.L. (IV SETS) IMPLANT
UNDERPAD 30X30 (UNDERPADS AND DIAPERS) ×3 IMPLANT
WATER STERILE IRR 1000ML POUR (IV SOLUTION) ×3 IMPLANT

## 2017-06-28 NOTE — Anesthesia Postprocedure Evaluation (Signed)
Anesthesia Post Note  Patient: [name]  Procedure(s) Performed: BYPASS GRAFT COMMON FEMORAL- ABOVE KNEE POPLITEAL ARTERY WITH LEFT GREATER SAPHENOUS VEIN (Right )     Patient location during evaluation: PACU Anesthesia Type: General Level of consciousness: sedated and patient cooperative Pain management: pain level controlled Vital Signs Assessment: post-procedure vital signs reviewed and stable Respiratory status: spontaneous breathing Cardiovascular status: stable Anesthetic complications: no    Last Vitals:  Vitals:   06/28/17 1530 06/28/17 1551  BP:  129/62  Pulse: 70 73  Resp: 16 (!) 9  Temp: 36.6 C 36.4 C  SpO2: 99% 100%    Last Pain:  Vitals:   06/28/17 1551  TempSrc: Oral  PainSc:                  [name]

## 2017-06-28 NOTE — Anesthesia Procedure Notes (Signed)
Procedure Name: Intubation Date/Time: 06/28/2017 7:44 AM Performed by: Tillman Abide Pre-anesthesia Checklist: Patient identified, Emergency Drugs available, Suction available and Patient being monitored Patient Re-evaluated:Patient Re-evaluated prior to induction Oxygen Delivery Method: Circle System Utilized Preoxygenation: Pre-oxygenation with 100% oxygen Induction Type: IV induction Ventilation: Mask ventilation without difficulty Tube type: Oral Tube size: 8.0 mm Number of attempts: 1 Airway Equipment and Method: Stylet and Oral airway Placement Confirmation: ETT inserted through vocal cords under direct vision,  positive ETCO2 and breath sounds checked- equal and bilateral Secured at: 22 cm Tube secured with: Tape Dental Injury: Teeth and Oropharynx as per pre-operative assessment

## 2017-06-28 NOTE — Progress Notes (Signed)
Lunch relief by S. Gregson RN 

## 2017-06-28 NOTE — Transfer of Care (Signed)
Immediate Anesthesia Transfer of Care Note  Patient: [name]  Procedure(s) Performed: BYPASS GRAFT COMMON FEMORAL- ABOVE KNEE POPLITEAL ARTERY WITH LEFT GREATER SAPHENOUS VEIN (Right )  Patient Location: PACU  Anesthesia Type:General  Level of Consciousness: oriented, drowsy, patient cooperative and responds to stimulation  Airway & Oxygen Therapy: Patient Spontanous Breathing  Post-op Assessment: Report given to RN and Post -op Vital signs reviewed and stable  Post vital signs: Reviewed and stable  Last Vitals:  Vitals:   06/28/17 0602  BP: (!) 156/73  Pulse: (!) 55  Resp: 19  Temp: 36.8 C  SpO2: 97%    Last Pain:  Vitals:   06/28/17 0602  TempSrc: Oral      Patients Stated Pain Goal: 2 (06/28/17 3976)  Complications: No apparent anesthesia complications   Resting eyes closed. RR even and unlabored. 02 via [place] 2L

## 2017-06-28 NOTE — H&P (View-Only) (Signed)
History of Present Illness   [name] is a 66 y.o. (01-Jun-1951) male  who presents with chief complaint: R>L leg pain.  Onset of symptom occurred one year ago without obvious trigger.  Pain is described as cramping and heaviness, severity 3-6/10, and associated with walking.  His intermittent claudication start with walking <1/2 block.  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  Patient has attempted to treat this pain with rest and angiogram.  The patient has no rest pain symptoms also and no leg wounds/ulcers.    Pt has recently been cleared by Cardiology to proceed with R CFA to AK pop bypass.  Pt returns today for preop counseling and BLE GSV maping  Atherosclerotic risk factors include: HLD, HTN, and prior recent smoker.   Past Medical History:  Diagnosis Date  . Arthritis   . Asthma    occ bronchitis  . COPD (chronic obstructive pulmonary disease) (HCC)   . Coronary artery disease    a. s/p multiple prior PCIs; b. s/p CABG 06/2006 (L-LAD, S-Int and OM, S-PDA);  c. ETT-Myoview 4/13: low risk, no ischemia, EF 53%  . GERD (gastroesophageal reflux disease)   . Hx of echocardiogram    Echo 10/2006: normal LVF  . Hyperlipidemia   . Hypertension   . Myocardial infarction (HCC)   . Seasonal allergies     Past Surgical History:  Procedure Laterality Date  . ABDOMINAL AORTOGRAM N/A 04/28/2017   Procedure: ABDOMINAL AORTOGRAM;  Surgeon: Iran Ouch, MD;  Location: [facility] INVASIVE CV LAB;  Service: Cardiovascular;  Laterality: N/A;  . ACNE CYST REMOVAL    . CARDIAC CATHETERIZATION    . COLONOSCOPY    . CORONARY ARTERY BYPASS GRAFT  2007  . CORONARY STENT PLACEMENT     x5 prior to cabg  . KNEE ARTHROSCOPY     rt knee  . LOWER EXTREMITY ANGIOGRAPHY Bilateral 04/28/2017   Procedure: Lower Extremity Angiography;  Surgeon: Iran Ouch, MD;  Location: [facility] INVASIVE CV LAB;   Service: Cardiovascular;  Laterality: Bilateral;  . SHOULDER ARTHROSCOPY  12/14/2011   removed bone spurs and repositioned muscle.   . TONSILLECTOMY      Social History   Social History  . Marital status: Married    Spouse name: N/A  . Number of children: N/A  . Years of education: N/A   Occupational History  . RETAIL    Social History Main Topics  . Smoking status: Former Smoker    Packs/day: 1.00    Types: Cigarettes    Quit date: 03/22/2017  . Smokeless tobacco: Never Used  . Alcohol use 0.0 oz/week     Comment: occasional - BEER OR WINE  . Drug use: No  . Sexual activity: Not on file   Other Topics Concern  . 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    Family History  Problem Relation Age of Onset  . Coronary artery disease Father        brothers, sisters  . Heart disease Father   . Diabetes Mother   . Heart disease Sister   . Heart disease Sister   . Heart disease Brother   . Anesthesia problems Neg Hx     Current Outpatient Prescriptions  Medication Sig Dispense Refill  .  albuterol (PROVENTIL HFA;VENTOLIN HFA) 108 (90 Base) MCG/ACT inhaler Inhale 2 puffs into the lungs every 4 (four) hours as needed for wheezing or shortness of breath (cough, shortness of breath or wheezing.). 1 Inhaler 0  . amLODipine (NORVASC) 10 MG tablet TAKE 1 TABLET (10 MG TOTAL) BY MOUTH DAILY. 90 tablet 3  . aspirin EC 81 MG tablet Take 81 mg by mouth daily.    . Bilberry 1000 MG CAPS Take 1,000 mg by mouth daily.     . cetirizine (ZYRTEC) 10 MG tablet Take 10 mg by mouth daily.     . cilostazol (PLETAL) 100 MG tablet TAKE 1 TABLET BY MOUTH TWICE DAILY. TAKE 30 MINUTES BEFORE OR 2 HOURS AFTER FOOD. 60 tablet 0  . co-enzyme Q-10 30 MG capsule Take 30 mg by mouth daily.     . diphenhydramine-acetaminophen (TYLENOL PM EXTRA STRENGTH) 25-500 MG TABS Take 2 tablets by mouth at bedtime.     Marland Kitchen.  lisinopril (PRINIVIL,ZESTRIL) 5 MG tablet Take 1 tablet (5 mg total) by mouth daily. 90 tablet 0  . LIVALO 1 MG TABS TAKE 1 TABLET BY MOUTH DAILY. 90 tablet 0  . LUTEIN PO Take 20 mg by mouth daily.     . Magnesium 250 MG TABS Take 250 mg by mouth daily.    . metoprolol succinate (TOPROL-XL) 50 MG 24 hr tablet TAKE 1 TABLET BY MOUTH DAILY. TAKE WITH OR IMMEDIATELY FOLLOWING A MEAL. 90 tablet 1  . Multiple Vitamins-Minerals (MULTIVITAMIN ADULTS PO) Take 1 tablet by mouth daily. VitaFusion    . naproxen sodium (ANAPROX) 220 MG tablet Take 220 mg by mouth 2 (two) times daily as needed (for pain.).     Marland Kitchen. omeprazole (PRILOSEC) 20 MG capsule TAKE 1 CAPSULE (20 MG TOTAL) BY MOUTH DAILY. 30 capsule 0   No current facility-administered medications for this visit.      Allergies  Allergen Reactions  . Codeine Nausea And Vomiting    REVIEW OF SYSTEMS (negative unless checked):   Cardiac:  [x]  Chest pain or chest pressure? []  Shortness of breath upon activity? []  Shortness of breath when lying flat? []  Irregular heart rhythm?  Vascular:  [x]  Pain in calf, thigh, or hip brought on by walking? []  Pain in feet at night that wakes you up from your sleep? []  Blood clot in your veins? []  Leg swelling?  Pulmonary:  []  Oxygen at home? []  Productive cough? []  Wheezing?  Neurologic:  []  Sudden weakness in arms or legs? []  Sudden numbness in arms or legs? []  Sudden onset of difficult speaking or slurred speech? []  Temporary loss of vision in one eye? []  Problems with dizziness?  Gastrointestinal:  []  Blood in stool? []  Vomited blood?  Genitourinary:  []  Burning when urinating? []  Blood in urine?  Psychiatric:  []  Major depression  Hematologic:  []  Bleeding problems? []  Problems with blood clotting?  Dermatologic:  []  Rashes or ulcers?  Constitutional:  []  Fever or chills?  Ear/Nose/Throat:  []  Change in hearing? []  Nose bleeds? []  Sore  throat?  Musculoskeletal:  []  Back pain? []  Joint pain? []  Muscle pain?   For VQI Use Only   PRE-ADM LIVING Home  AMB STATUS Ambulatory  CAD Sx History of MI, but no symptoms No MI within 6 months  PRIOR CHF None  STRESS TEST No   Physical Examination   Vitals:   06/25/17 1350  BP: 120/64  Pulse: (!) 55  Resp: 20  Temp: (!) 97.4 F (36.3 C)  TempSrc: Oral  SpO2: 97%  Weight: 230 lb (104.3 kg)  Height: 5' 10" (1.778 m)   Body mass index is 33 kg/m.  General Alert, O x 3, WD, NAD  Head [name]/AT,    Ear/Nose/ Throat Hearing grossly intact, nares without erythema or drainage, oropharynx without Erythema or Exudate, Mallampati score: 3,   Eyes PERRLA, EOMI,    Neck Supple, mid-line trachea,    Pulmonary Sym exp, good B air movt, CTA B  Cardiac RRR, Nl S1, S2, no Murmurs, No rubs, No S3,S4  Vascular Vessel Right Left  Radial Palpable Palpable  Brachial Palpable Palpable  Carotid Palpable, No Bruit Palpable, No Bruit  Aorta Not palpable N/A  Femoral Palpable Palpable  Popliteal Palpable Prominently palpable  PT Not palpable Not palpable  DP Not palpable Not palpable    Gastro- intestinal soft, non-distended, non-tender to palpation, No guarding or rebound, no HSM, no masses, no CVAT B, No palpable prominent aortic pulse,    Musculo- skeletal M/S 5/5 throughout  , Extremities without ischemic changes  , No edema present, Varicosities present: B small, No Lipodermatosclerosis present  Neurologic Cranial nerves 2-12 intact , Pain and light touch intact in extremities , Motor exam as listed above  Psychiatric Judgement intact, Mood & affect appropriate for pt's clinical situation  Dermatologic See M/S exam for extremity exam, No rashes otherwise noted  Lymphatic  Palpable lymph nodes: None    Non-Invasive Vascular imaging   B GSV Mapping (06/25/2017) Adequate L GSV, inadequate R GSV (harvested)   Outside Studies/Documentation   4 pages of outside  documents were reviewed including: outpatient Cardiology chart.   Medical Decision Making   [name] is a 66 y.o. (09/30/1950) male who presents with: R>L occupation limiting intermittent claudication.   I have offered the patient R CFA to AK vs BK pop bypass on 22 OCT 18. The risk, benefits, and alternative for bypass operations were discussed with the patient.   The patient is aware the risks include but are not limited to: bleeding, infection, myocardial infarction, stroke, limb loss, nerve damage, limb edema, need for additional procedures in the future, wound complications, and inability to complete the bypass.  The patient is aware of these risks and agreed to proceed.  I discussed in depth with the patient the nature of atherosclerosis, and emphasized the importance of maximal medical management including strict control of blood pressure, blood glucose, and lipid levels, antiplatelet agent, obtaining regular exercise, and cessation of smoking.    The patient is aware that without maximal medical management the underlying atherosclerotic disease process will progress, limiting the benefit of any interventions.  The patient is going to resume swimming as his exercise once he recovers from surgery.  The patient is currently not on a statin.  Will start Lipitor 10 mg PO daily.  The patient is currently on an anti-platelet: ASA.   Thank you for allowing us to participate in this patient's care.   Dale Dewald, MD, FACS [facility] [place] Office: [phone] Pager: [phone]  

## 2017-06-28 NOTE — Op Note (Addendum)
OPERATIVE NOTE   PROCEDURE: 1. Right common femoral artery to above-the-knee popliteal artery bypass with non-reversed left greater saphenous vein   PRE-OPERATIVE DIAGNOSIS: occupation limiting intermittent claudication   POST-OPERATIVE DIAGNOSIS: same as above   SURGEON: Leonides SakeBrian Chen, MD  ASSISTANT(S): Debbora PrestoSteve Eureste, CSA  ANESTHESIA: general  ESTIMATED BLOOD LOSS: 50 cc  FINDING(S): 1.  Calcified right common femoral artery with soft spot in distal common femoral artery  2.  Diseased above-the-knee popliteal artery with distal calcification 3.  Palpable popliteal artery artery after bypass 4.  Palpable dorsalis pedis artery and anterior tibial artery > posterior tibial artery at end of case 5.  Somewhat inflamed vein conduit  SPECIMEN(S):  none  INDICATIONS:   [name] is a 66 y.o. male who presents with occupation limiting intermittent claudication.  Based on angiography, I offered the patient a right common femoral artery to above-the-knee vs below-the-knee popliteal artery bypass with left greater saphenous vein. The risk, benefits, and alternative for bypass operations were discussed with the patient.  The patient is aware the risks include but are not limited to: bleeding, infection, myocardial infarction, stroke, limb loss, nerve damage, need for additional procedures in the future, wound complications, and inability to complete the bypass.  The patient is aware of these risks and agreed to proceed.  DESCRIPTION: After full informed written consent was obtained, the patient was brought back to the operating room and placed supine upon the operating table.  Prior to induction, the patient was given intravenous antibiotics.  After obtaining adequate anesthesia, the patient was prepped and draped in the standard fashion for a femoral to popliteal bypass operation.  Attention was turned to the right groin.  A longitudinal incision was made over the right common femoral  artery.  Using blunt dissection and electrocautery, the artery was dissected out from the inguinal ligament down to the femoral bifurcation.  The superficial femoral artery, profunda femoral artery, and external iliac artery were dissected out and vessel loops applied.  Circumflex branches were also dissected and controlled with silk ties.  This common femoral artery was found on exam to be calcified except for a soft window distally.  At this point, attention was turned to the distal thigh.  An longitudinal incision was made over Hunter's canal.  Using blunt dissection and electrocautery, a plane was developed through the subcutaneous tissue and fascia down to Hunter's canal.  I had to dissect away the crossing nerve to gain exposure of the above-the-knee popliteal artery.  The above-the-knee popliteal artery was dissected away from its adjacent veins.  I ligated several of these vein to get exposure.  This popliteal artery was found on exam to be calcified with a window of soft tissue.    At this point, the patient's left greater saphenous vein was identified under Sonosite guidance.  Two skip incisions was made over the greater saphenous vein from the saphenofemoral junction down to level of the knee.  The vein conduit was found to be adequate with size: 3-5 mm.  There extensive inflammation surrounding the vein though the vein itself did not appear sclerotic.  Side branches of greater saphenous vein were tied off with 4-0 silk or clipped with small titanium clips.  The saphenofemoral junction was clamped and the greater saphenous vein was transected.  The saphenofemoral junction was oversewn in a double layer with a running stitch of 5-0 prolene.  The distal extent of this conduit was tied off at the level of the knee  and the conduit transected proximally.  The harvest vein conduit was soaked in heparinized saline solution.  At this point, I tunneled from the above-the-knee popliteal exposure to the right  groin with a Gore tunneler.  The patient was given 11000 units of Heparin intravenously, which was a therapeutic bolus. An additional 2000 units of Heparin was administered every hour after initial bolus to maintain anticoagulation.  In total, 46962 units of Heparin was administrated to achieve and maintain a therapeutic level of anticoagulation.  After waiting 3 minutes, I clamped the right proximal common femoral artery and distal common femoral artery.  I made an arteriotomy and extended it proximally and distally.    The proximal extent of the vein conduit was spatulated to the geometry of the arteriotomy.  The conduit was sewn to the common femoral artery in an end-to-side configuration with a running stitch of 5-0 Prolene.  Prior to completing this anastomosis, I allowed the proximal and distal common femoral artery to backbleed.  There was no clot noted.  I completed the anastomosis in the usual fashion.  I clamped the distal vein and then passed valvulotomy twice through the vein conduit, lysing valve bluntly.  There was good pulsatile bleeding from the end of the conduit after this maneuver.  I had to repair a couple of areas in the vein with 7-0 Prolene stitches.  There were a few areas in the proximal anastomosis that had to be repaired with 5-0 Prolene stitches.  I marked the anterior orientation of the vein conduit.    The vein conduit was sewn to the inner cannula of the tunneler.  I clamped the vein bypass proximally with a Fogarty clamp and then passed the bypass through the tunnel, taking care to maintain the orientation of the conduit.  The distal end of the conduit was clamped.  I removed the metal tunnel, leaving the vein conduit in place.  I released the Fogarty clamp to verify no twists in the bypass.  There was an excellent pulse in the bypass.  I reapplied the Fogary clamp proximally on the vein bypass.  The above-the-knee popliteal artery was then placed under tension proximally and  distally with vessel loops.   In this process, the calcification in the distal extent of the above-the-knee popliteal artery became apparent.  An arteriotomy was made with a 11-blade and were extended proximally and distally with a Pott's scissor.  This arterial lumen demonstrated obvious atherosclerotic plaque in an eccentric fashion.  I dissected out the above-the-knee popliteal artery more proximally as this segment was softer.  I extended the arteriotomy more proximally.  I clamped the artery proximally to get better control.  There was vigorous bleeding from the proximal end when I unclamped.    The distal end of the vein conduit was spatulated, shortening the conduit in the process.  The conduit was sewn to the to the artery in an end-to-side configuration with a running stitch of 5-0 Prolene.  Prior to completing the anastomosis, I backbled both end of the artery.  There was: no clots evident.  The conduit was allowed to bleed in an antegrade fashion.  There was: no clots evident.  I also easily passed a 3 mm dilator distally with space to spare.  I completed this anastomosis in the usual fashion.  I released all vessel loops and clamps.  There was immediately a pulse in this bypass and above-the-knee popliteal artery artery.  Distally I could feel an anterior tibial artery and posterior  tibial artery pulses.  This was verified with continuous doppler and compression of the bypass graft.  At this point, all incisions were washed out.  Bleeding points were controlled with electrocautery, and suture repair of active bleeding points in the suture line.  No further bleeding was present.  I packed all incisions with Avitene.  The vein harvest incisions were washed out and no further bleeding noted.  The two vein harvest incisions were reapproximated with a double layer of 3-0 Vicryl.  The skin in both incisions was then stapled due to the depth of the vein and extensive fat present.  A sterile Coverall  bandage was placed over the vein harvest incisions.  Attention was turned to the right groin, the Avitene was removed.  There was no active bleeding.  I repaired this groin with a double layer of 2-0 Vicryl, a double layer of 3-0 Vicryl, and a running subcuticular of 4-0 Monocryl in the skin layer.   The skin was cleaned, dried, and reinforced with Dermabond.  I then turned my attention to the above-the-knee popliteal artery exposure.  There was bleeding from the heel of the anastomosis.  I placed a single stitch in the heel with a 6-0 Prolene.  This appeared to control the bleeding.  I washed out this incision.  No active bleeding was present.  I elected to place a 15 fluted circular JP drain due to the depth of the anastomosis.  I past the drain trocar through the distal subcutaneous tissue.  I pulled the drain to appropriate length and secured the drain with a 3-0 Nylon tied to skin and drain.  I sharply shortened the drain and laid the drain into the dependent portion of this exposure.  The above-the-knee exposure was repaired with a double layer of 2-0 Vicryl, a single layer of 3-0 Vicryl, and a running subcuticular of 4-0 Monocryl in the skin layer.   All skin incisions were cleaned, dried, and reinforced with Dermabond.    The drain was placed under bulb suction.  Distally, I could easily feel a dorsalis pedis artery and posterior tibial artery pulses.   COMPLICATIONS: none  CONDITION: stable   [name], MD, [facility] [place] Office: [phone] Pager: [phone]  06/28/2017, 1:17 PM

## 2017-06-28 NOTE — Progress Notes (Signed)
Toprol xl held due to hr of 55.

## 2017-06-28 NOTE — Anesthesia Procedure Notes (Addendum)
Arterial Line Insertion Start/End10/22/2018 6:58 AM, 06/28/2017 7:06 AM Performed by: Izola Price, CRNA  Preanesthetic checklist: patient identified, IV checked, site marked, risks and benefits discussed, surgical consent, monitors and equipment checked, pre-op evaluation and timeout performed Right, radial was placed Catheter size: 20 G Hand hygiene performed , maximum sterile barriers used  and Seldinger technique used Allen's test indicative of satisfactory collateral circulation Attempts: 2 Procedure performed without using ultrasound guided technique. Following insertion, Biopatch and dressing applied. Patient tolerated the procedure well with no immediate complications.

## 2017-06-28 NOTE — Interval H&P Note (Signed)
History and Physical Interval Note:  06/28/2017 7:11 AM  [name]  has presented today for surgery, with the diagnosis of peripheral vascular disease  The various methods of treatment have been discussed with the patient and family. After consideration of risks, benefits and other options for treatment, the patient has consented to  Procedure(s): BYPASS GRAFT COMMON FEMORAL- ABOVE KNEE POPLITEAL ARTERY WITH LEFT GREATER SAPHENOUS VEIN (Right) as a surgical intervention .  The patient's history has been reviewed, patient examined, no change in status, stable for surgery.  I have reviewed the patient's chart and labs.  Questions were answered to the patient's satisfaction.     [name]

## 2017-06-29 ENCOUNTER — Encounter (HOSPITAL_COMMUNITY): Payer: Medicare Other

## 2017-06-29 ENCOUNTER — Encounter (HOSPITAL_COMMUNITY): Payer: Self-pay | Admitting: Vascular Surgery

## 2017-06-29 LAB — LIPID PANEL
Cholesterol: 185 mg/dL (ref 0–200)
HDL: 34 mg/dL — ABNORMAL LOW (ref >40)
LDL CALC: 114 mg/dL — AB (ref 0–99)
TRIGLYCERIDES: 183 mg/dL — AB (ref <150)
Total CHOL/HDL Ratio: 5.4 RATIO
VLDL: 37 mg/dL (ref 0–40)

## 2017-06-29 LAB — BASIC METABOLIC PANEL
Anion gap: 7 (ref 5–15)
BUN: 16 mg/dL (ref 6–20)
CALCIUM: 8.5 mg/dL — AB (ref 8.9–10.3)
CO2: 25 mmol/L (ref 22–32)
CREATININE: 0.98 mg/dL (ref 0.61–1.24)
Chloride: 102 mmol/L (ref 101–111)
GFR calc non Af Amer: >60 mL/min (ref >60)
Glucose, Bld: 150 mg/dL — ABNORMAL HIGH (ref 65–99)
Potassium: 4.3 mmol/L (ref 3.5–5.1)
SODIUM: 134 mmol/L — AB (ref 135–145)

## 2017-06-29 LAB — CBC
HEMATOCRIT: 34.7 % — AB (ref 39.0–52.0)
Hemoglobin: 11.6 g/dL — ABNORMAL LOW (ref 13.0–17.0)
MCH: 29.4 pg (ref 26.0–34.0)
MCHC: 33.4 g/dL (ref 30.0–36.0)
MCV: 87.8 fL (ref 78.0–100.0)
Platelets: 187 10*3/uL (ref 150–400)
RBC: 3.95 MIL/uL — ABNORMAL LOW (ref 4.22–5.81)
RDW: 14.1 % (ref 11.5–15.5)
WBC: 11.2 10*3/uL — ABNORMAL HIGH (ref 4.0–10.5)

## 2017-06-29 NOTE — Progress Notes (Addendum)
[facility] [place]  Subjective  - Incisions healing well.   Objective (!) 115/58 75 98.5 F (36.9 C) (Oral) 13 95%  Intake/Output Summary (Last 24 hours) at 06/29/17 0727 Last data filed at 06/29/17 0700  Gross per 24 hour  Intake          3571.25 ml  Output             1640 ml  Net          1931.25 ml    Right LE palpable DP, doppler DP/PT/peroneal Groins soft without hematoma JP in place right LE 10 cc out put Heart RRR Lungs non labored breathing  Assessment/Planning: POD # 1 PROCEDURE: Right common femoral artery to below-the-knee popliteal artery bypass with non-reversed left greater saphenous vein   PT encourage mobility Will maintain JP for a few days   Clinton Gallant [facility] 06/29/2017 7:27 AM --  Laboratory Lab Results:  Recent Labs  06/28/17 0635 06/29/17 0139  WBC 8.5 11.2*  HGB 13.6 11.6*  HCT 40.8 34.7*  PLT 196 187   BMET  Recent Labs  06/28/17 0635 06/29/17 0139  NA 136 134*  K 4.1 4.3  CL 103 102  CO2 24 25  GLUCOSE 122* 150*  BUN 20 16  CREATININE 1.18 0.98  CALCIUM 9.1 8.5*    COAG Lab Results  Component Value Date   INR 0.97 06/28/2017   INR 1.0 04/26/2017   INR 0.93 12/11/2011   No results found for: PTT  Addendum  I have independently interviewed and examined the patient, and I agree with the physician assistant's findings.  Palpable DP with faintly palpable PT.  All incision c/d/i without obvious hematoma.  Lipid Panel     Component Value Date/Time   CHOL 185 06/29/2017 0139   CHOL 239 (H) 03/31/2017 1112   TRIG 183 (H) 06/29/2017 0139   HDL 34 (L) 06/29/2017 0139   HDL 36 (L) 03/31/2017 1112   CHOLHDL 5.4 06/29/2017 0139   VLDL 37 06/29/2017 0139   LDLCALC 114 (H) 06/29/2017 0139   LDLCALC Comment 03/31/2017 1112   LDLDIRECT 91.0 06/16/2013 0906   - PT/OT - Ambulate - Started Lipitor last night - HLIV - Ok to transition to Telemetry status - Home once pain  better   [name], MD, FACS [facility] [place] Office: [phone] Pager: [phone]  06/29/2017, 10:15 AM

## 2017-06-29 NOTE — Evaluation (Signed)
Physical Therapy Evaluation Patient Details Name: [name] MRN: [id number] DOB: 10-27-1950 Today's Date: 06/29/2017   History of Present Illness  Pt is a 66 y.o. male s/p R common femoral artery to below-the-knee popliteal artery bypass with non-reversed L greater saphenous vein  secondary to intermittent claudication and R>L LE pain. PMH significant for arthritis, asthma, COPS, coronary artery disease, GERD, hyperlipidemia, hypertension, and myocardial infarction.  Clinical Impression  Patient is s/p above surgery resulting in functional limitations due to the deficits listed below (see PT Problem List). Pt mobility is severely limited by R LE pain. Pt is currently modAx2 for bed mobility, transfers and ambulation of 4 feet with RW.  Patient will benefit from skilled PT to increase their independence and safety with mobility to allow discharge to the venue listed below.       Follow Up Recommendations Home health PT    Equipment Recommendations  Rolling walker with 5" wheels    Recommendations for Other Services       Precautions / Restrictions Precautions Precautions: Fall Restrictions Weight Bearing Restrictions: No      Mobility  Bed Mobility Overal bed mobility: Needs Assistance Bed Mobility: Sit to Supine       Sit to supine: Mod assist   General bed mobility comments: Mod assist to lift B LE onto bed.   Transfers Overall transfer level: Needs assistance Equipment used: Rolling walker (2 wheeled) Transfers: Sit to/from Stand Sit to Stand: Mod assist;+2 physical assistance         General transfer comment: Mod assist +2 for lifting and steadying. Pt reports that once he "gets going" he feels better up on his feet.   Ambulation/Gait Ambulation/Gait assistance: Mod assist;+2 physical assistance Ambulation Distance (Feet): 4 Feet Assistive device: Rolling walker (2 wheeled) Gait Pattern/deviations: Step-to pattern;Shuffle;Decreased step length -  right;Decreased step length - left Gait velocity: slowed Gait velocity interpretation: Below normal speed for age/gender General Gait Details: pt able to take 3 shuffling steps forward and 3 shuffling steps back to bed, vc for increased UE support to offweight painful R LE          Balance Overall balance assessment: Needs assistance Sitting-balance support: Feet supported;Bilateral upper extremity supported;Single extremity supported Sitting balance-Leahy Scale: Fair Sitting balance - Comments: Using B UE support due to pain   Standing balance support: Bilateral upper extremity supported;During functional activity Standing balance-Leahy Scale: Poor Standing balance comment: Heavily reliant on B UE support.                              Pertinent Vitals/Pain Pain Assessment: 0-10 Pain Score: 9  Pain Location: R LE Pain Descriptors / Indicators: Grimacing;Guarding;Operative site guarding Pain Intervention(s): Limited activity within patient's tolerance;Monitored during session;Repositioned;Patient requesting pain meds-RN notified    Home Living Family/patient expects to be discharged to:: Private residence Living Arrangements: Spouse/significant other Available Help at Discharge: Family;Available PRN/intermittently (wife works at [employer] ) Type of Home: Mobile home Home Access: Stairs to enter Entrance Stairs-Rails: Right;Left;Can reach both Secretary/administrator of Steps: 3 Home Layout: One level Home Equipment: Shower seat;Grab bars - tub/shower;Grab bars - toilet      Prior Function Level of Independence: Independent         Comments: Drives. Completely independent. Works in Engineer, technical sales.      Hand Dominance   Dominant Hand: Right    Extremity/Trunk Assessment   Upper Extremity Assessment Upper Extremity  Assessment: Defer to OT evaluation    Lower Extremity Assessment Lower Extremity Assessment: RLE deficits/detail RLE  Deficits / Details: R LE ROM limited by surgical pain, strength grossly 4/5       Communication      Cognition Arousal/Alertness: Awake/alert Behavior During Therapy: WFL for tasks assessed/performed Overall Cognitive Status: Within Functional Limits for tasks assessed                                        General Comments General comments (skin integrity, edema, etc.): VSS throughout session        Assessment/Plan    PT Assessment Patient needs continued PT services  PT Problem List Decreased strength;Decreased range of motion;Decreased activity tolerance;Decreased balance;Decreased mobility;Decreased knowledge of use of DME;Cardiopulmonary status limiting activity;Pain       PT Treatment Interventions Gait training;DME instruction;Stair training;Functional mobility training;Therapeutic activities;Therapeutic exercise;Balance training;Patient/family education    PT Goals (Current goals can be found in the Care Plan section)  Acute Rehab PT Goals Patient Stated Goal: to get back to working PT Goal Formulation: With patient Time For Goal Achievement: 07/13/17 Potential to Achieve Goals: Fair    Frequency Min 3X/week     Co-evaluation PT/OT/SLP Co-Evaluation/Treatment: Yes Reason for Co-Treatment: For patient/therapist safety PT goals addressed during session: Balance;Mobility/safety with mobility OT goals addressed during session: ADL's and self-care       AM-PAC PT "6 Clicks" Daily Activity  Outcome Measure Difficulty turning over in bed (including adjusting bedclothes, sheets and blankets)?: A Little Difficulty moving from lying on back to sitting on the side of the bed? : Unable Difficulty sitting down on and standing up from a chair with arms (e.g., wheelchair, bedside commode, etc,.)?: Unable Help needed moving to and from a bed to chair (including a wheelchair)?: A Lot Help needed walking in hospital room?: A Lot Help needed climbing 3-5  steps with a railing? : Total 6 Click Score: 10    End of Session Equipment Utilized During Treatment: Gait belt Activity Tolerance: Patient limited by pain Patient left: in bed;with call bell/phone within reach;with nursing/sitter in room;with SCD's reapplied (SCD L LE) Nurse Communication: Mobility status PT Visit Diagnosis: Unsteadiness on feet (R26.81);Other abnormalities of gait and mobility (R26.89);Muscle weakness (generalized) (M62.81);Difficulty in walking, not elsewhere classified (R26.2);Pain Pain - Right/Left: Right Pain - part of body: Leg    Time: 4098-11911240-1252 PT Time Calculation (min) (ACUTE ONLY): 12 min   Charges:   PT Evaluation $PT Eval Moderate Complexity: 1 Mod     PT G Codes:        Sahvanna Mcmanigal B. Beverely RisenVan Fleet PT, DPT Acute Rehabilitation  [phone] Pager [phone]    Elon Alaslizabeth B Van Fleet 06/29/2017, 3:37 PM

## 2017-06-29 NOTE — Care Management Note (Signed)
Case Management Note  Patient Details  Name: [name] MRN: [id number] Date of Birth: 1951-01-15  Subjective/Objective:                 Patient from home with wife, has been  cleared by Cardiology to proceed with R CFA to AK pop bypass.  Pt returns today for preop counseling and BLE GSV mapping.    Action/Plan:  CM will continue to follow for DC planning.  Expected Discharge Date:                  Expected Discharge Plan:  Home/Self Care  In-House Referral:     Discharge planning Services  CM Consult  Post Acute Care Choice:    Choice offered to:     DME Arranged:    DME Agency:     HH Arranged:    HH Agency:     Status of Service:  In process, will continue to follow  If discussed at Long Length of Stay Meetings, dates discussed:    Additional Comments:  Lawerance Sabal, RN 06/29/2017, 8:29 AM

## 2017-06-29 NOTE — Progress Notes (Signed)
Foley catheter and A line have been removed.

## 2017-06-29 NOTE — Evaluation (Signed)
Occupational Therapy Evaluation Patient Details Name: [name] MRN: [id number] DOB: 05/06/1951 Today's Date: 06/29/2017    History of Present Illness Pt is a 66 y.o. male s/p R common femoral artery to below-the-knee popliteal artery bypass with non-reversed L greater saphenous vein  secondary to intermittent claudication and R>L LE pain. PMH significant for arthritis, asthma, COPS, coronary artery disease, GERD, hyperlipidemia, hypertension, and myocardial infarction.   Clinical Impression   PTA, pt was independent with ADL and functional mobility and was working. He currently requires mod assist +2 for toilet transfers taking a few steps with RW and max assist for LB ADL. He was limited by significant pain this session as well as decreased activity tolerance for ADL. He would benefit from continued OT services while admitted to improve independence and safety with ADL and functional mobility prior to returning home. Feel pt will make good progress once pain is under control and do not anticipate need for OT follow-up post-acute D/C.    Follow Up Recommendations  No OT follow up;Supervision/Assistance - 24 hour    Equipment Recommendations  3 in 1 bedside commode    Recommendations for Other Services       Precautions / Restrictions Precautions Precautions: Fall Restrictions Weight Bearing Restrictions: No      Mobility Bed Mobility Overal bed mobility: Needs Assistance Bed Mobility: Sit to Supine       Sit to supine: Mod assist   General bed mobility comments: Mod assist to lift B LE onto bed.   Transfers Overall transfer level: Needs assistance Equipment used: Rolling walker (2 wheeled) Transfers: Sit to/from Stand Sit to Stand: Mod assist;+2 physical assistance         General transfer comment: Mod assist +2 for lifting and steadying. Pt reports that once he "gets going" he feels better up on his feet.     Balance Overall balance assessment: Needs  assistance Sitting-balance support: Feet supported;Bilateral upper extremity supported;Single extremity supported Sitting balance-Leahy Scale: Fair Sitting balance - Comments: Using B UE support due to pain   Standing balance support: Bilateral upper extremity supported;During functional activity Standing balance-Leahy Scale: Poor Standing balance comment: Heavily reliant on B UE support.                            ADL either performed or assessed with clinical judgement   ADL Overall ADL's : Needs assistance/impaired Eating/Feeding: Set up;Sitting   Grooming: Set up;Sitting;Wash/dry face   Upper Body Bathing: Set up;Sitting   Lower Body Bathing: Maximal assistance;Sit to/from stand Lower Body Bathing Details (indicate cue type and reason): mod assist +2 for sit<> stand Upper Body Dressing : Set up;Sitting   Lower Body Dressing: Maximal assistance;Sit to/from stand Lower Body Dressing Details (indicate cue type and reason): mod assist +2 for sit<>stand Toilet Transfer: Moderate assistance;+2 for physical assistance Toilet Transfer Details (indicate cue type and reason): Taking a few steps forward and backward from bed for simulated toilet transfer.  Toileting- Clothing Manipulation and Hygiene: Maximal assistance;Sit to/from stand       Functional mobility during ADLs: Moderate assistance;+2 for physical assistance General ADL Comments: Pt limited by significant pain but very motivated to return to PLOF.      Vision Baseline Vision/History: Wears glasses Wears Glasses: At all times Patient Visual Report: No change from baseline Vision Assessment?: No apparent visual deficits     Perception     Praxis      Pertinent  Vitals/Pain Pain Assessment: 0-10 Pain Score: 9  Pain Location: R LE Pain Descriptors / Indicators: Grimacing;Guarding;Operative site guarding Pain Intervention(s): Limited activity within patient's tolerance;Monitored during  session;Repositioned;Patient requesting pain meds-RN notified     Hand Dominance Right   Extremity/Trunk Assessment Upper Extremity Assessment Upper Extremity Assessment: Overall WFL for tasks assessed   Lower Extremity Assessment Lower Extremity Assessment: Defer to PT evaluation       Communication     Cognition Arousal/Alertness: Awake/alert Behavior During Therapy: WFL for tasks assessed/performed Overall Cognitive Status: Within Functional Limits for tasks assessed                                     General Comments  VSS throughout session.     Exercises     Shoulder Instructions      Home Living Family/patient expects to be discharged to:: Private residence Living Arrangements: Spouse/significant other Available Help at Discharge: Family;Available PRN/intermittently (wife works at [employer] ) Type of Home: Mobile home Home Access: Stairs to enter [employer] of Steps: 3 Entrance Stairs-Rails: Right;Left;Can reach both Home Layout: One level     Bathroom Shower/Tub: Producer, television/film/video:  (comfort height)     Home Equipment: Shower seat;Grab bars - tub/shower;Grab bars - toilet          Prior Functioning/Environment Level of Independence: Independent        Comments: Drives. Completely independent. Works in Engineer, technical sales.         OT Problem List: Decreased strength;Decreased activity tolerance;Impaired balance (sitting and/or standing);Decreased safety awareness;Decreased knowledge of use of DME or AE;Decreased knowledge of precautions;Pain      OT Treatment/Interventions: Self-care/ADL training;Therapeutic exercise;Energy conservation;DME and/or AE instruction;Therapeutic activities;Patient/family education;Balance training    OT Goals(Current goals can be found in the care plan section) Acute Rehab OT Goals Patient Stated Goal: to get back to working OT Goal Formulation: With  patient Time For Goal Achievement: 07/13/17 Potential to Achieve Goals: Good ADL Goals Pt Will Perform Grooming: with supervision;standing Pt Will Perform Lower Body Dressing: with supervision;sit to/from stand (with or without AE) Pt Will Transfer to Toilet: with supervision;ambulating;bedside commode (BSC over toilet) Pt Will Perform Toileting - Clothing Manipulation and hygiene: with supervision;sit to/from stand Pt Will Perform Tub/Shower Transfer: Shower transfer;with min assist;ambulating;shower seat;rolling walker  OT Frequency: Min 2X/week   Barriers to D/C:            Co-evaluation PT/OT/SLP Co-Evaluation/Treatment: Yes Reason for Co-Treatment: For patient/therapist safety;To address functional/ADL transfers   OT goals addressed during session: ADL's and self-care      AM-PAC PT "6 Clicks" Daily Activity     Outcome Measure Help from another person eating meals?: None Help from another person taking care of personal grooming?: None Help from another person toileting, which includes using toliet, bedpan, or urinal?: A Lot Help from another person bathing (including washing, rinsing, drying)?: A Lot Help from another person to put on and taking off regular upper body clothing?: None Help from another person to put on and taking off regular lower body clothing?: A Lot 6 Click Score: 18   End of Session Equipment Utilized During Treatment: Rolling walker Nurse Communication: Mobility status  Activity Tolerance: Patient tolerated treatment well Patient left: in bed;with call bell/phone within reach;with nursing/sitter in room  OT Visit Diagnosis: Unsteadiness on feet (R26.81);Pain Pain - Right/Left: Right Pain - part of  body: Leg                Time: [id number] OT Time Calculation (min): 19 min Charges:  OT General Charges $OT Visit: 1 Visit OT Evaluation $OT Eval Moderate Complexity: 1 Mod G-Codes:     Dale Sectionharity A Kent Riendeau, MS OTR/L  Pager: [phone]   Wanona Stare A  Hezzie Karim 06/29/2017, 2:44 PM

## 2017-06-30 NOTE — Care Management Note (Signed)
Case Management Note  Patient Details  Name: DANTE KEETCH MRN: [id number] Date of Birth: 10-12-1950  Subjective/Objective:                 Spoke w patient's wife over the phone. She stated she is familiar wit [facility] from prior use and would like to use them again at DC. Referral accepted by Vaughan Basta for [facility] PT RN and DME hospital bed w trapeze, RW and 3/1. [facility] will be in contact with patient to set up delivery.    Action/Plan:   Expected Discharge Date:                  Expected Discharge Plan:  Home/Self Care  In-House Referral:     Discharge planning Services  CM Consult  Post Acute Care Choice:  Durable Medical Equipment, Home Health Choice offered to:  Spouse  DME Arranged:  3-N-1, Walker rolling, Hospital bed, Trapeze DME Agency:  [facility]  HH Arranged:  RN, PT [facility] Agency:  [facility]  Status of Service:  In process, will continue to follow  If discussed at Long Length of Stay Meetings, dates discussed:    Additional Comments:  Lawerance Sabal, RN 06/30/2017, 2:37 PM

## 2017-06-30 NOTE — Consult Note (Signed)
   [facility] CM Inpatient Consult   06/30/2017  [name] August 24, 1951 [id number]    Went to bedside to visit [name] on behalf of [facility] Care Management program for [facility] employees/dependents with [facility] insurance. His wife, [employer] employee, was present as well.   Explained [employer]. [name] denies having any [employer] [facility] Well[facility] needs at this time.   Agreeable to post hospital discharge call. Confirmed best contact number as [phone].  Wife states patient will have home health arranged upon discharge and will need equipment such as bedside commode and walker. States they have used [facility] in the past.   Will make (covering) inpatient RNCM aware of bedside conversation with both patient and wife.    Raiford Noble, MSN-Ed, RN,BSN [facility] Liaison [phone]

## 2017-06-30 NOTE — Care Management (Signed)
    Durable Medical Equipment        Start     Ordered   06/30/17 0735  For home use only DME 3 n 1  Once     06/30/17 0735   06/30/17 0735  For home use only DME Walker rolling  Once    Question:  Patient needs a walker to treat with the following condition  Answer:  PAD (peripheral artery disease) (HCC)   06/30/17 0735   06/30/17 0734  For home use only DME Hospital bed  Once    Question Answer Comment  Patient has (list medical condition): s/p right LE by pass, left LE vein harvest   The above medical condition requires: Patient requires the ability to reposition frequently   Head must be elevated greater than: Other see comments   Bed type Semi-electric      06/30/17 0735

## 2017-06-30 NOTE — Progress Notes (Addendum)
     Subjective  - Pain with ambulation through out the whole right LE.  Difficult to walk.   Objective 132/69 86 97.9 F (36.6 C) (Oral) 14 92%  Intake/Output Summary (Last 24 hours) at 06/30/17 0730 Last data filed at 06/30/17 0500  Gross per 24 hour  Intake              630 ml  Output             1255 ml  Net             -625 ml    Doppler DP/PT/peroneal B LE Groins soft without hematoma JP right LE 30 cc last 24 hours Heart RRR Lungs non labord breathing  Assessment/Planning: POD # 2 PROCEDURE: Rightcommon femoral artery to below-the-knee popliteal artery bypass with non-reversed left greater saphenous vein   PT recommending HH PT, rolling walker, 3 in 1 and patient requesting hospital bed. Will maintain JP for a few days    Clinton Gallant [facility] 06/30/2017 7:30 AM --  Laboratory Lab Results:  Recent Labs  06/28/17 0635 06/29/17 0139  WBC 8.5 11.2*  HGB 13.6 11.6*  HCT 40.8 34.7*  PLT 196 187   BMET  Recent Labs  06/28/17 0635 06/29/17 0139  NA 136 134*  K 4.1 4.3  CL 103 102  CO2 24 25  GLUCOSE 122* 150*  BUN 20 16  CREATININE 1.18 0.98  CALCIUM 9.1 8.5*    COAG Lab Results  Component Value Date   INR 0.97 06/28/2017   INR 1.0 04/26/2017   INR 0.93 12/11/2011   No results found for: PTT  Addendum  I have independently interviewed and examined the patient, and I agree with the physician assistant's findings.  Home in next few days once pain better controlled.  Keep JP in place until D/C  [name], MD, [facility] [place] Office: [phone] Pager: [phone]  06/30/2017, 7:54 AM

## 2017-07-01 ENCOUNTER — Inpatient Hospital Stay (HOSPITAL_COMMUNITY): Payer: 59

## 2017-07-01 DIAGNOSIS — I70213 Atherosclerosis of native arteries of extremities with intermittent claudication, bilateral legs: Secondary | ICD-10-CM

## 2017-07-01 NOTE — Progress Notes (Signed)
Occupational Therapy Treatment Patient Details Name: [name] MRN: [id number] DOB: 13-May-1951 Today's Date: 07/01/2017    History of present illness Pt is a 66 y.o. male s/p R common femoral artery to below-the-knee popliteal artery bypass with non-reversed L greater saphenous vein  secondary to intermittent claudication and R>L LE pain. PMH significant for arthritis, asthma, COPS, coronary artery disease, GERD, hyperlipidemia, hypertension, and myocardial infarction.   OT comments  This 66 yo male admitted and underwent above presents to acute OT with all education completed and no further questions from pt about basic ADLs. We will D/C from acute OT.  Follow Up Recommendations  No OT follow up;Supervision/Assistance - 24 hour    Equipment Recommendations  3 in 1 bedside commode       Precautions / Restrictions Precautions Precautions: Fall Restrictions Weight Bearing Restrictions: No       Mobility Bed Mobility Overal bed mobility: Needs Assistance Bed Mobility: Supine to Sit     Supine to sit: Min guard;HOB elevated (use of rail) Sit to supine: Min assist;HOB elevated (A for RLE)   General bed mobility comments: pt used rail and HOB elevated slightly; cues for sequencing; increased time and effor and min guard for safety  Transfers Overall transfer level: Needs assistance Equipment used: Rolling walker (2 wheeled) Transfers: Sit to/from Stand Sit to Stand: Min guard;From elevated surface         General transfer comment: Pt ambulated 80 feet with minguard A and RW    Balance Overall balance assessment: Needs assistance Sitting-balance support: No upper extremity supported;Feet supported Sitting balance-Leahy Scale: Good     Standing balance support: Bilateral upper extremity supported;During functional activity Standing balance-Leahy Scale: Poor Standing balance comment: heavy reliance on RW                           ADL either performed  or assessed with clinical judgement   ADL Overall ADL's : Needs assistance/impaired                         Toilet Transfer: Min guard;Regular Toilet;RW;Grab bars Statistician Details (indicate cue type and reason): and use of door jam on left side           General ADL Comments: We discussed that wife will A with LBD prn until pt can do for himself. Educated pt that stepping backwards into shower stall may go better so that he doesn't have to try to get turned around in shower with RW.      Vision Patient Visual Report: No change from baseline            Cognition Arousal/Alertness: Awake/alert Behavior During Therapy: WFL for tasks assessed/performed Overall Cognitive Status: Within Functional Limits for tasks assessed                                                General Comments HR up to 132 with mobility    Pertinent Vitals/ Pain       Pain Assessment: 0-10 Pain Score: 5  (with putting more weight through legs and less through arms) Faces Pain Scale: Hurts even more Pain Location: R LE Pain Descriptors / Indicators: Guarding;Sore Pain Intervention(s): Limited activity within patient's tolerance;Monitored during session;Repositioned  Frequency  Min 2X/week        Progress Toward Goals  OT Goals(current goals can now be found in the care plan section)  Progress towards OT goals:  (all education completed)     Plan Discharge plan remains appropriate       AM-PAC PT "6 Clicks" Daily Activity     Outcome Measure   Help from another person eating meals?: None Help from another person taking care of personal grooming?: A Little Help from another person toileting, which includes using toliet, bedpan, or urinal?: A Little Help from another person bathing (including washing, rinsing, drying)?: A Lot Help from another person to put on and taking off regular upper body clothing?: A Little Help from another person to put  on and taking off regular lower body clothing?: Total 6 Click Score: 16    End of Session Equipment Utilized During Treatment: Rolling walker;Gait belt  OT Visit Diagnosis: Unsteadiness on feet (R26.81);Pain Pain - Right/Left: Right Pain - part of body: Leg   Activity Tolerance Patient tolerated treatment well   Patient Left in bed;with call bell/phone within reach (bed alarm not on when I entered)   Nurse Communication          Time: [id number] OT Time Calculation (min): 26 min  Charges: OT General Charges $OT Visit: 1 Visit OT Treatments $Self Care/Home Management : 23-37 mins  Ignacia PalmaCathy Breckon Reeves, OTR/L [phone] 07/01/2017

## 2017-07-01 NOTE — Progress Notes (Signed)
ABI's have been completed. Preliminary results can be found in chart review -> CV Proc  07/01/17 11:55 AM Olen Cordial RVT

## 2017-07-01 NOTE — Progress Notes (Addendum)
[facility]  Subjective  - Doing OK over all.   Objective (!) 144/63 78 98.4 F (36.9 C) (Oral) 15 96%  Intake/Output Summary (Last 24 hours) at 07/01/17 0726 Last data filed at 07/01/17 0600  Gross per 24 hour  Intake              600 ml  Output             1160 ml  Net             -560 ml    Doppler DP/PT B Groins soft Incisions healing well JP in place right LE popliteal area 10 cc out put last 24 hours  Assessment/Planning: POD # 3 PROCEDURE: Rightcommon femoral artery to below-the-knee popliteal artery bypass with non-reversed left greater saphenous vein   PT/3 in 1 and rolling walker ordered for home. Plan discharge tomorrow once increased mobility and pain is controlled.  Clinton Gallant [facility] 07/01/2017 7:26 AM --  Laboratory Lab Results:  Recent Labs  06/29/17 0139  WBC 11.2*  HGB 11.6*  HCT 34.7*  PLT 187   BMET  Recent Labs  06/29/17 0139  NA 134*  K 4.3  CL 102  CO2 25  GLUCOSE 150*  BUN 16  CREATININE 0.98  CALCIUM 8.5*    COAG Lab Results  Component Value Date   INR 0.97 06/28/2017   INR 1.0 04/26/2017   INR 0.93 12/11/2011   No results found for: PTT  Addendum  I have independently interviewed and examined the patient, and I agree with the physician assistant's findings.  On exam R DP pulse not palpable but AT has faint pulse.  Calf is warm.  ABI reviewed.  ABI suggest interval occlusion of the bypass.  - Remove JP drain and bandage - obtain R arterial duplex today - No need to emergently take patient to OR today in event bypass is occluded as no evidence of limb ischemia - Intraop: diseased AK pop and questionable (some evidence of perivenous inflammation) vein conduit might be culprit     [name], MD, FACS [facility] [place] Office: [phone] Pager: [phone]  07/01/2017, 2:09 PM

## 2017-07-01 NOTE — Progress Notes (Signed)
Physical Therapy Treatment Patient Details Name: [name] MRN: [id number] DOB: 07/27/1951 Today's Date: 07/01/2017    History of Present Illness Pt is a 66 y.o. male s/p R common femoral artery to below-the-knee popliteal artery bypass with non-reversed L greater saphenous vein  secondary to intermittent claudication and R>L LE pain. PMH significant for arthritis, asthma, COPS, coronary artery disease, GERD, hyperlipidemia, hypertension, and myocardial infarction.    PT Comments    Patient is making progress toward mobility goals. Pt tolerated ambulating 9380ft with min A and +2 for chair follow. Pt demonstrated difficulty with L foot clearance and tends to shuffle due to pain with R LE weightbearing. Pt needs to work on increased gait distance, foot clearance, and stairs next session.    Follow Up Recommendations  Home health PT     Equipment Recommendations  Rolling walker with 5" wheels    Recommendations for Other Services       Precautions / Restrictions Precautions Precautions: Fall    Mobility  Bed Mobility Overal bed mobility: Needs Assistance Bed Mobility: Supine to Sit     Supine to sit: Min guard     General bed mobility comments: pt used rail and HOB elevated slightly; cues for sequencing; increased time and effor and min guard for safety  Transfers Overall transfer level: Needs assistance Equipment used: Rolling walker (2 wheeled) Transfers: Sit to/from Stand Sit to Stand: Min assist;From elevated surface         General transfer comment: assist to power up into standing and to steady upon standing due to pain in R LE with weightbearing  Ambulation/Gait Ambulation/Gait assistance: Min assist;+2 safety/equipment Ambulation Distance (Feet): 80 Feet Assistive device: Rolling walker (2 wheeled) Gait Pattern/deviations: Step-to pattern;Decreased step length - left;Decreased stance time - right;Decreased dorsiflexion - left;Decreased dorsiflexion -  right;Antalgic Gait velocity: decreased   General Gait Details: cues for posture, sequencing, and technique to offload R LE; pt with decreased L foot clearance    Stairs            Wheelchair Mobility    Modified Rankin (Stroke Patients Only)       Balance Overall balance assessment: Needs assistance Sitting-balance support: Feet supported;Bilateral upper extremity supported Sitting balance-Leahy Scale: Fair     Standing balance support: Bilateral upper extremity supported;During functional activity Standing balance-Leahy Scale: Poor Standing balance comment: heavy reliance on RW                            Cognition Arousal/Alertness: Awake/alert Behavior During Therapy: WFL for tasks assessed/performed Overall Cognitive Status: Within Functional Limits for tasks assessed                                        Exercises      General Comments General comments (skin integrity, edema, etc.): HR up to 132 with mobility      Pertinent Vitals/Pain Pain Assessment: Faces Faces Pain Scale: Hurts even more Pain Location: R LE Pain Descriptors / Indicators: Grimacing;Guarding;Sore Pain Intervention(s): Limited activity within patient's tolerance;Monitored during session;Premedicated before session;Repositioned    Home Living                      Prior Function            PT Goals (current goals can now be found in the care  plan section) Acute Rehab PT Goals PT Goal Formulation: With patient Time For Goal Achievement: 07/13/17 Potential to Achieve Goals: Fair Progress towards PT goals: Progressing toward goals    Frequency    Min 3X/week      PT Plan Current plan remains appropriate    Co-evaluation              AM-PAC PT "6 Clicks" Daily Activity  Outcome Measure  Difficulty turning over in bed (including adjusting bedclothes, sheets and blankets)?: A Little Difficulty moving from lying on back to sitting  on the side of the bed? : Unable Difficulty sitting down on and standing up from a chair with arms (e.g., wheelchair, bedside commode, etc,.)?: Unable Help needed moving to and from a bed to chair (including a wheelchair)?: A Little Help needed walking in hospital room?: A Little Help needed climbing 3-5 steps with a railing? : Total 6 Click Score: 12    End of Session Equipment Utilized During Treatment: Gait belt Activity Tolerance: Patient tolerated treatment well Patient left: with call bell/phone within reach;in chair Nurse Communication: Mobility status PT Visit Diagnosis: Unsteadiness on feet (R26.81);Other abnormalities of gait and mobility (R26.89);Muscle weakness (generalized) (M62.81);Difficulty in walking, not elsewhere classified (R26.2);Pain Pain - Right/Left: Right Pain - part of body: Leg     Time: 6160-7371 PT Time Calculation (min) (ACUTE ONLY): 23 min  Charges:  $Gait Training: 8-22 mins $Therapeutic Activity: 8-22 mins                    G Codes:       Erline Levine, PTA Pager: [phone]     Carolynne Edouard 07/01/2017, 12:27 PM

## 2017-07-02 ENCOUNTER — Other Ambulatory Visit (HOSPITAL_COMMUNITY): Payer: 59

## 2017-07-02 ENCOUNTER — Encounter (HOSPITAL_COMMUNITY): Payer: Medicare Other

## 2017-07-02 ENCOUNTER — Encounter (HOSPITAL_COMMUNITY): Payer: 59

## 2017-07-02 ENCOUNTER — Ambulatory Visit: Payer: 59 | Admitting: Vascular Surgery

## 2017-07-02 ENCOUNTER — Inpatient Hospital Stay (HOSPITAL_COMMUNITY): Payer: 59

## 2017-07-02 DIAGNOSIS — I70213 Atherosclerosis of native arteries of extremities with intermittent claudication, bilateral legs: Secondary | ICD-10-CM

## 2017-07-02 MED ORDER — OXYCODONE HCL 5 MG PO TABS: 5.0000 mg | ORAL_TABLET | Freq: Four times a day (QID) | ORAL | 0 refills | Status: DC | PRN

## 2017-07-02 NOTE — Progress Notes (Signed)
PT Cancellation Note  Patient Details Name: [name] MRN: [id number] DOB: Jan 22, 1951   Cancelled Treatment:    Reason Eval/Treat Not Completed: Patient at procedure or test/unavailable Pt being transported off unit for duplex US. PT will check on pt later as time allows.    Derek Mound, PTA Pager: [phone]   07/02/2017, 10:47 AM

## 2017-07-02 NOTE — Care Management (Signed)
1236 07-02-17 Tomi Bamberger, RN, BSN [phone] CM did reach out to [facility] for [facility] in regards to [facility] Bed Delivery. Per [employer] will not pay for DME until an actual d/c order is placed for home. Pt does not have an order at this time, however CM did not want pt to have a delay in d/c due to non-delivery of DME. Staff RN Luther Parody is reaching out to Physician in regards to d/c day. If pt is ready for d/c today- please call Clydie Braun with [facility] @ [phone] and if after 5:00 pm please call the [facility] office at [phone]. No further needs at this time. CM will continue to monitor.

## 2017-07-02 NOTE — Progress Notes (Addendum)
[facility] [place]  Subjective  - Doing better, ambulated more.   Objective (!) 132/98 86 98.7 F (37.1 C) (Oral) 14 95%  Intake/Output Summary (Last 24 hours) at 07/02/17 0711 Last data filed at 07/02/17 0300  Gross per 24 hour  Intake              358 ml  Output             1630 ml  Net            -1272 ml   +-------+---------------+----------------+ ABI/TBIToday's ABI/TBIPrevious ABI/TBI +-------+---------------+----------------+ Right 0.52      0.65       +-------+---------------+----------------+ Left  0.65      0.82       +-------+---------------+----------------+  Doppler DP/PT right, Left DP/AT/peroneal Incisions healing well, JP site clean and dry Heart RRR Lungs non labored breathing  Assessment/Planning: POD # 4PROCEDURE: Rightcommon femoral artery to below-the-knee popliteal artery bypass with non-reversed left greater saphenous vein   ABI lower than expected.  Plan to duplex right LE by pass Discharge plan will pend on duplex.  Clinton Gallant [facility] 07/02/2017 7:11 AM   Addendum  I have independently interviewed and examined the patient, and I agree with the physician assistant's findings.  R foot looks better today with faintly palpable DP today.  Bypass graft appears to be patent with biphasic flow.  Unfortunately, I can't get the study to pull up on Syngo.  ABI and bypass duplex are NOT consistent with each other.  Pt has known significant R AK pop calcification and stenosis but easily was able to pass 3-4 mm dilators through distal lumen.    - Discussed conflict between the two non-invasive studies with patient - Two options offered to patient: 1.  Go home and repeat ABI in 2 weeks.  If abnormal, will repeat angiogram and possibly immediately intervene. 2.  Go back to OR tomorrow for R leg angiogram and possible immediate surgical revision.  The patient elected Option 1.  Will arrange  follow up for patient in 2 weeks with BLE ABI.   [name], MD, FACS [facility] [place] Office: [phone] Pager: [phone]  07/02/2017, 4:56 PM   --  Laboratory Lab Results: No results for input(s): WBC, HGB, HCT, PLT in the last 72 hours. BMET No results for input(s): NA, K, CL, CO2, GLUCOSE, BUN, CREATININE, CALCIUM in the last 72 hours.  COAG Lab Results  Component Value Date   INR 0.97 06/28/2017   INR 1.0 04/26/2017   INR 0.93 12/11/2011   No results found for: PTT

## 2017-07-02 NOTE — Progress Notes (Signed)
Right lower extremity bypass graft duplex has been completed. Preliminary results can be found in chart review -> CV Proc  07/02/17 11:29 AM Olen Cordial RVT

## 2017-07-02 NOTE — Progress Notes (Signed)
Physical Therapy Treatment Patient Details Name: [name] MRN: [id number] DOB: [birth year] Today's Date: 07/02/2017    History of Present Illness Pt is a 66 y.o. male s/p R common femoral artery to below-the-knee popliteal artery bypass with non-reversed L greater saphenous vein  secondary to intermittent claudication and R>L LE pain. PMH significant for arthritis, asthma, COPS, coronary artery disease, GERD, hyperlipidemia, hypertension, and myocardial infarction.    PT Comments    Patient continues to progress toward mobility goals. Pt tolerated increased gait distance and with improved gait mechanics and was able to safely negotiate steps this session. Current plan remains appropriate.    Follow Up Recommendations  Home health PT     Equipment Recommendations  Rolling walker with 5" wheels    Recommendations for Other Services       Precautions / Restrictions Precautions Precautions: Fall    Mobility  Bed Mobility Overal bed mobility: Needs Assistance Bed Mobility: Supine to Sit     Supine to sit: Supervision     General bed mobility comments: supervision for safety; pt able to mobilize bilat LE with less difficutly this session  Transfers Overall transfer level: Needs assistance Equipment used: Rolling walker (2 wheeled) Transfers: Sit to/from Stand Sit to Stand: Min guard         General transfer comment: min guard for safety; cues for safe hand placement  Ambulation/Gait Ambulation/Gait assistance: Supervision;Min guard Ambulation Distance (Feet): 200 Feet Assistive device: Rolling walker (2 wheeled) Gait Pattern/deviations: Decreased step length - left;Decreased stance time - right;Antalgic;Step-through pattern Gait velocity: decreased   General Gait Details: cues for posture and proximity of RW; pt with improved abiliaty to advance R LE and safer L foot clearance    Stairs Stairs: Yes   Stair Management: One rail Left;Step to  pattern;Forwards Number of Stairs: 2 General stair comments: cues for sequencing and technique  Wheelchair Mobility    Modified Rankin (Stroke Patients Only)       Balance Overall balance assessment: Needs assistance Sitting-balance support: No upper extremity supported;Feet supported Sitting balance-Leahy Scale: Good     Standing balance support: Bilateral upper extremity supported;During functional activity Standing balance-Leahy Scale: Poor                              Cognition Arousal/Alertness: Awake/alert Behavior During Therapy: WFL for tasks assessed/performed Overall Cognitive Status: Within Functional Limits for tasks assessed                                        Exercises      General Comments        Pertinent Vitals/Pain Pain Assessment: Faces Faces Pain Scale: Hurts little more Pain Location: R LE Pain Descriptors / Indicators: Guarding;Sore Pain Intervention(s): Limited activity within patient's tolerance;Monitored during session;Premedicated before session;Repositioned    Home Living                      Prior Function            PT Goals (current goals can now be found in the care plan section) Acute Rehab PT Goals PT Goal Formulation: With patient Time For Goal Achievement: 07/13/17 Potential to Achieve Goals: Fair Progress towards PT goals: Progressing toward goals    Frequency    Min 3X/week      PT Plan  Current plan remains appropriate    Co-evaluation              AM-PAC PT "6 Clicks" Daily Activity  Outcome Measure  Difficulty turning over in bed (including adjusting bedclothes, sheets and blankets)?: A Little Difficulty moving from lying on back to sitting on the side of the bed? : A Lot Difficulty sitting down on and standing up from a chair with arms (e.g., wheelchair, bedside commode, etc,.)?: Unable Help needed moving to and from a bed to chair (including a wheelchair)?: A  Little Help needed walking in hospital room?: A Little Help needed climbing 3-5 steps with a railing? : A Little 6 Click Score: 15    End of Session Equipment Utilized During Treatment: Gait belt Activity Tolerance: Patient tolerated treatment well Patient left: with call bell/phone within reach;in chair Nurse Communication: Mobility status PT Visit Diagnosis: Unsteadiness on feet (R26.81);Other abnormalities of gait and mobility (R26.89);Muscle weakness (generalized) (M62.81);Difficulty in walking, not elsewhere classified (R26.2);Pain Pain - Right/Left: Right Pain - part of body: Leg     Time: 1210-1243 PT Time Calculation (min) (ACUTE ONLY): 33 min  Charges:  $Gait Training: 23-37 mins                    G Codes:       Erline LevineKellyn Jazarah Capili, PTA Pager: [phone]     Carolynne EdouardKellyn R Sameul Tagle 07/02/2017, 2:24 PM

## 2017-07-02 NOTE — Progress Notes (Signed)
Discussed with the patient and all questioned fully answered. He will call me if any problems arise. Pt given paper Rx for oxycodone - pt verbalized understanding that oxycodone was not due until 8:27pm. Called [facility] to tell them pt had a discharge order for today for bed to be delivered. IV removed. Telemetry removed, CCMD notified.  Leonidas Romberg, RN

## 2017-07-05 ENCOUNTER — Other Ambulatory Visit: Payer: Self-pay

## 2017-07-05 ENCOUNTER — Other Ambulatory Visit: Payer: Self-pay | Admitting: *Deleted

## 2017-07-05 ENCOUNTER — Telehealth: Payer: Self-pay | Admitting: Vascular Surgery

## 2017-07-05 ENCOUNTER — Ambulatory Visit: Payer: 59 | Admitting: Family Medicine

## 2017-07-05 ENCOUNTER — Telehealth: Payer: Self-pay

## 2017-07-05 DIAGNOSIS — Z48812 Encounter for surgical aftercare following surgery on the circulatory system: Secondary | ICD-10-CM

## 2017-07-05 MED ORDER — OXYCODONE-ACETAMINOPHEN 5-325 MG PO TABS: 1.0000 | ORAL_TABLET | Freq: Four times a day (QID) | ORAL | 0 refills | Status: DC | PRN

## 2017-07-05 MED FILL — AMLODIPINE BESYLATE 10 MG T: 10 | 90 days supply | Qty: 90 | Fill #1

## 2017-07-05 MED FILL — OXYCODONE W/APAP 5/325 TAB: 5-325 | 8 days supply | Qty: 30 | Fill #0

## 2017-07-05 NOTE — Patient Outreach (Signed)
[facility]) Care Management  07/05/2017  [name] 11-Dec-1950 [id number]  Patient was discharged on 07/02/17 after undergoing femoral- popliteal  bypass graft using left greater saphenous vein on 06/28/17 by Dr. [name]. Transition of care call was done earlier today by primary care provider office so will not call patient again today. Bary Richard RN,CCM,CDE [facility] Care Management Coordinator [facility] [employer] [phone] Office Fax [phone]

## 2017-07-05 NOTE — Telephone Encounter (Signed)
-----   Message from Sharee Pimple, RN sent at 07/03/2017 10:54 AM EDT ----- Regarding: 2 weeks w/ ABI   ----- Message ----- From: Lars Mage, PA-C Sent: 07/02/2017   4:59 PM To: Vvs Charge Pool  F/U with Dr. Imogene Burn in 2 weeks needs ABI's s/p right fem-pop

## 2017-07-05 NOTE — Telephone Encounter (Signed)
Transition Care Management Follow-up Telephone Call  Talked with patient's wife [name]  Date discharged?07/02/17   How have you been since you were released from the hospital? Per patient's wife ([name]) Patient is still having right calf pain even after the surgery. Specialist has prescribed some pain medication to help aid with this.    Do you understand why you were in the hospital? yes   Do you understand the discharge instructions? yes   Where were you discharged to? Home   Items Reviewed:  Medications reviewed: yes  Allergies reviewed: yes  Dietary changes reviewed: yes  Referrals reviewed: yes   Functional Questionnaire:   Activities of Daily Living (ADLs):   He states they are independent in the following: ambulation, bathing and hygiene, feeding, continence, grooming, toileting, dressing  States they require assistance with the following: none   Any transportation issues/concerns?: no   Any patient concerns? No, Wants to follow up with PCP to discuss progress   Confirmed importance and date/time of follow-up visits scheduled yes  Provider Appointment booked with Dr. Norberto Sorenson on 07/08/17 @ 3 pm.   Confirmed with patient if condition begins to worsen call PCP or go to the ER.  Patient was given the office number and encouraged to call back with question or concerns.  : yes

## 2017-07-05 NOTE — Telephone Encounter (Signed)
Sched lab 07/21/17 at 4:00 and MD 07/23/17 at 9:00. Spoke to pt's wife.

## 2017-07-05 NOTE — Discharge Summary (Signed)
[facility] Discharge Summary   Patient ID:  [name] MRN: [id number] DOB/AGE: 65-Jun-1952 65 y.o.  Admit date: 06/28/2017 Discharge date: 07/02/2017 Date of Surgery: 06/28/2017 Surgeon: Surgeon(s): Fransisco Hertz, MD  Admission Diagnosis: Post-op pain [G89.18]  Discharge Diagnoses:  Post-op pain [G89.18]  Secondary Diagnoses: Past Medical History:  Diagnosis Date  . Arthritis   . Asthma    occ bronchitis  . Bronchitis   . COPD (chronic obstructive pulmonary disease) (HCC)   . Coronary artery disease    a. s/p multiple prior PCIs; b. s/p CABG 06/2006 (L-LAD, S-Int and OM, S-PDA);  c. ETT-Myoview 4/13: low risk, no ischemia, EF 53%  . GERD (gastroesophageal reflux disease)   . Hx of echocardiogram    Echo 10/2006: normal LVF  . Hyperlipidemia   . Hypertension   . Myocardial infarction (HCC)   . Peripheral vascular disease (HCC)   . Seasonal allergies   . Wears dentures   . Wears glasses     Procedure(s): BYPASS GRAFT COMMON FEMORAL- ABOVE KNEE POPLITEAL ARTERY WITH LEFT GREATER SAPHENOUS VEIN  Discharged Condition: good  HPI: [name] is a 66 y.o. (November 01, 1950) male  who presents with chief complaint: R>Lleg pain. Onset of symptom occurred one year ago without obvious trigger. Pain is described as cramping and heaviness, severity 3-6/10, and associated with walking. His intermittent claudication start with walking <1/2 block. 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 The patient has norest pain symptoms also and noleg wounds/ulcers.   Pt has recently been cleared by Cardiology to proceed with R CFA to AK pop bypass.  Pt returns today for preop counseling and BLE GSV maping   Hospital Course:  DEMERIUS Wolfe is a 66 y.o. male is S/P right Procedure(s): BYPASS GRAFT COMMON  FEMORAL- ABOVE KNEE POPLITEAL ARTERY WITH LEFT GREATER SAPHENOUS VEIN  Post op problems included mobility and pain.  He had a palpable DP on the right, this was lost and decreased ABI.  We ordered an arterial duple of the by pass which showed Bypass graft appears to be patent with biphasic flow.   Pt has known significant R AK pop calcification and stenosis but easily was able to pass 3-4 mm dilators through distal lumen.    - Discussed conflict between the two non-invasive studies with patient - Two options offered to patient: 1.  Go home and repeat ABI in 2 weeks.  If abnormal, will repeat angiogram and possibly immediately intervene. 2.  Go back to OR tomorrow for R leg angiogram and possible immediate surgical revision.  The patient elected Option 1.  Will arrange follow up for patient in 2 weeks with BLE ABI.    Significant Diagnostic Studies: CBC Lab Results  Component Value Date   WBC 11.2 (H) 06/29/2017   HGB 11.6 (L) 06/29/2017   HCT 34.7 (L) 06/29/2017   MCV 87.8 06/29/2017   PLT 187 06/29/2017    BMET    Component Value Date/Time   NA 134 (L) 06/29/2017 0139   NA 141 04/26/2017 1406   K 4.3 06/29/2017 0139   CL 102 06/29/2017 0139   CO2 25 06/29/2017 0139   GLUCOSE 150 (H) 06/29/2017 0139   BUN 16 06/29/2017 0139   BUN 19 04/26/2017 1406   CREATININE 0.98 06/29/2017 0139   CREATININE 0.82 06/04/2016  1131   CALCIUM 8.5 (L) 06/29/2017 0139   GFRNONAA >60 06/29/2017 0139   GFRNONAA >89 10/03/2014 1042   GFRAA >60 06/29/2017 0139   GFRAA >89 10/03/2014 1042   COAG Lab Results  Component Value Date   INR 0.97 06/28/2017   INR 1.0 04/26/2017   INR 0.93 12/11/2011     Disposition:  Discharge to :Home Discharge Instructions    AMB Referral to [facility]    Complete by:  As directed    Please assign UMR member for post discharge call.Currently at [facility]. Raiford Noble, MSN-Ed, RN,BSN-THN [facility] Liaison-[phone]   Reason  for consult:  Please assign UMR member for post discharge call   Expected date of contact:  1-3 days (reserved for hospital discharges)   Call MD for:  redness, tenderness, or signs of infection (pain, swelling, bleeding, redness, odor or green/yellow discharge around incision site)    Complete by:  As directed    Call MD for:  severe or increased pain, loss or decreased feeling  in affected limb(s)    Complete by:  As directed    Call MD for:  temperature >100.5    Complete by:  As directed    Discharge instructions    Complete by:  As directed    You may shower in 24 hours, keep incision dry between bathing.   Driving Restrictions    Complete by:  As directed    No driving for 1 week   Lifting restrictions    Complete by:  As directed    No lifting for 3-4 weeks   Resume previous diet    Complete by:  As directed      Allergies as of 07/02/2017      Reactions   Codeine Nausea And Vomiting      Medication List    TAKE these medications   albuterol 108 (90 Base) MCG/ACT inhaler Commonly known as:  PROVENTIL HFA;VENTOLIN HFA Inhale 2 puffs into the lungs every 4 (four) hours as needed for wheezing or shortness of breath (cough, shortness of breath or wheezing.).   amLODipine 10 MG tablet Commonly known as:  NORVASC TAKE 1 TABLET (10 MG TOTAL) BY MOUTH DAILY.   aspirin EC 81 MG tablet Take 81 mg by mouth daily.   cetirizine 10 MG tablet Commonly known as:  ZYRTEC Take 10 mg by mouth daily.   cilostazol 100 MG tablet Commonly known as:  PLETAL TAKE 1 TABLET BY MOUTH TWICE DAILY. TAKE 30 MINUTES BEFORE OR 2 HOURS AFTER FOOD.   diphenhydramine-acetaminophen 25-500 MG Tabs tablet Commonly known as:  TYLENOL PM Take 2 tablets by mouth at bedtime.   lisinopril 5 MG tablet Commonly known as:  PRINIVIL,ZESTRIL Take 1 tablet (5 mg total) by mouth daily.   LIVALO 1 MG Tabs Generic drug:  Pitavastatin Calcium Take 1 tablet by mouth daily.   LUTEIN PO Take 20 mg by  mouth daily.   Melatonin 5 MG Chew Chew 1 each by mouth at bedtime.   metoprolol succinate 50 MG 24 hr tablet Commonly known as:  TOPROL-XL TAKE 1 TABLET BY MOUTH DAILY. TAKE WITH OR IMMEDIATELY FOLLOWING A MEAL.   MULTIVITAMIN ADULTS PO Take 1 tablet by mouth daily. VitaFusion   naproxen sodium 220 MG tablet Commonly known as:  ALEVE Take 220 mg by mouth 2 (two) times daily as needed (for pain.).   omeprazole 20 MG capsule Commonly known as:  PRILOSEC TAKE 1 CAPSULE (20 MG  TOTAL) BY MOUTH DAILY.   oxyCODONE 5 MG immediate release tablet Commonly known as:  Oxy IR/ROXICODONE Take 1-2 tablets (5-10 mg total) by mouth every 6 (six) hours as needed for moderate pain.      Verbal and written Discharge instructions given to the patient. Wound care per Discharge AVS Follow-up Information    Fransisco Hertzhen, Shima Compere L, MD Follow up in 2 week(s).   Specialties:  Vascular Surgery, Cardiology Why:  office will call Contact information: [address] [place] [address] [phone]           Signed: Clinton GallantCOLLINS, EMMA [facility] 07/05/2017, 11:13 AM   Addendum  Mignon PineMichael L Siracusa is a 66 y.o. (11/16/1950) male who underwent R fem-pop BPG w/ L NR GSV.  Intra-operative findings were only remarkable for L vein conduit noteable for perivenous inflammation and heavily calcified .above-the-knee popliteal artery.  His post-operative course was unremarkable except some abnormal non-invasive studies post-operatively.  The ABI were consistent with occlusion of femoropopliteal bypass but the arterial duplex demonstrated a patent bypass with distal stenosis.  I offered the patient right leg angiography followed by intervention as needed.  He declined and will follow up in the office in 2 weeks for repeat studies.   Leonides SakeBrian Manjinder Breau, MD, FACS [facility] [place] Office: [phone] Pager: [phone]  07/05/2017, 8:28 PM   - For VQI Registry use --- Instructions: Press F2 to tab  through selections.  Delete question if not applicable.   Post-op:  Wound infection: No  Graft infection: No  Transfusion: No  If yes, 0 units given New Arrhythmia: No Ipsilateral amputation: [x ] no, [ ]  Minor, [ ]  BKA, [ ]  AKA Discharge patency: [x ] Primary, [ ]  Primary assisted, [ ]  Secondary, [ ]  Occluded Patency judged by: [x ] Dopper only, [ ]  Palpable graft pulse, [ ]  Palpable distal pulse, [ ]  ABI inc. > 0.15, [ ]  Duplex +-------+---------------+----------------+ ABI/TBIToday's ABI/TBIPrevious ABI/TBI +-------+---------------+----------------+ Right 0.52      0.65       +-------+---------------+----------------+ Left  0.65      0.82       +-------+---------------+----------------+ D/C Ambulatory Status: Ambulatory with Assistance  Complications: MI: [x ] No, [ ]  Troponin only, [ ]  EKG or Clinical CHF: No Resp failure: [x ] none, [ ]  Pneumonia, [ ]  Ventilator Chg in renal function: [x ] none, [ ]  Inc. Cr > 0.5, [ ]  Temp. Dialysis, [ ]  Permanent dialysis Stroke: [x ] None, [ ]  Minor, [ ]  Major Return to OR: No  Reason for return to OR: [ ]  Bleeding, [ ]  Infection, [ ]  Thrombosis, [ ]  Revision  Discharge medications: Statin use:  Yes ASA use:  Yes Plavix use:  No  for medical reason   Beta blocker use: No  for medical reason   Coumadin use: No  for medical reason

## 2017-07-06 ENCOUNTER — Telehealth: Payer: Self-pay | Admitting: *Deleted

## 2017-07-06 NOTE — Telephone Encounter (Signed)
Huntley Dec, home health nurse for [facility] called requesting orders for visits, twice weekly for 1 week and once weekly for 7 weeks.  A verbal order was given.  Huntley Dec states that patients incisions are good, no drainage present and no fever.  Huntley Dec will call when in the home either Thursday or Friday.

## 2017-07-08 ENCOUNTER — Ambulatory Visit (INDEPENDENT_AMBULATORY_CARE_PROVIDER_SITE_OTHER): Payer: 59 | Admitting: Family Medicine

## 2017-07-08 ENCOUNTER — Encounter: Payer: Self-pay | Admitting: Family Medicine

## 2017-07-08 VITALS — BP 118/54 | HR 66 | Temp 98.3°F | Resp 16 | Ht 70.0 in | Wt 228.6 lb

## 2017-07-08 DIAGNOSIS — I10 Essential (primary) hypertension: Secondary | ICD-10-CM | POA: Diagnosis not present

## 2017-07-08 DIAGNOSIS — D6489 Other specified anemias: Secondary | ICD-10-CM

## 2017-07-08 DIAGNOSIS — Z5181 Encounter for therapeutic drug level monitoring: Secondary | ICD-10-CM | POA: Diagnosis not present

## 2017-07-08 DIAGNOSIS — G8918 Other acute postprocedural pain: Secondary | ICD-10-CM

## 2017-07-08 DIAGNOSIS — D692 Other nonthrombocytopenic purpura: Secondary | ICD-10-CM | POA: Diagnosis not present

## 2017-07-08 DIAGNOSIS — E785 Hyperlipidemia, unspecified: Secondary | ICD-10-CM

## 2017-07-08 DIAGNOSIS — Z95828 Presence of other vascular implants and grafts: Secondary | ICD-10-CM

## 2017-07-08 DIAGNOSIS — E119 Type 2 diabetes mellitus without complications: Secondary | ICD-10-CM | POA: Diagnosis not present

## 2017-07-08 LAB — POCT CBC
Granulocyte percent: 80.3 %G — AB (ref 37–80)
HEMATOCRIT: 37.2 % — AB (ref 43.5–53.7)
HEMOGLOBIN: 12.3 g/dL — AB (ref 14.1–18.1)
Lymph, poc: 1.4 (ref 0.6–3.4)
MCH: 29.2 pg (ref 27–31.2)
MCHC: 33.1 g/dL (ref 31.8–35.4)
MCV: 88.3 fL (ref 80–97)
MID (cbc): 0.2 (ref 0–0.9)
MPV: 6.5 fL (ref 0–99.8)
POC GRANULOCYTE: 6.5 (ref 2–6.9)
POC LYMPH PERCENT: 17.2 %L (ref 10–50)
POC MID %: 2.5 % (ref 0–12)
Platelet Count, POC: 383 10*3/uL (ref 142–424)
RBC: 4.21 M/uL — AB (ref 4.69–6.13)
RDW, POC: 13.4 %
WBC: 8.1 10*3/uL (ref 4.6–10.2)

## 2017-07-08 LAB — POCT GLYCOSYLATED HEMOGLOBIN (HGB A1C): Hemoglobin A1C: 6.7

## 2017-07-08 MED ORDER — CEPHALEXIN 500 MG PO CAPS: 500.0000 mg | ORAL_CAPSULE | Freq: Four times a day (QID) | ORAL | 0 refills | Status: DC

## 2017-07-08 MED ORDER — PITAVASTATIN CALCIUM 2 MG PO TABS: 2.0000 mg | ORAL_TABLET | Freq: Every day | ORAL | 1 refills | Status: DC

## 2017-07-08 MED ORDER — TRIAMCINOLONE ACETONIDE 0.1 % EX CREA: 1.0000 "application " | TOPICAL_CREAM | Freq: Four times a day (QID) | CUTANEOUS | 0 refills | Status: DC | PRN

## 2017-07-08 MED FILL — CEPHALEXIN 500 MG CAPSULE: 500 | 5 days supply | Qty: 20 | Fill #0

## 2017-07-08 MED FILL — LIVALO 2 MG TABLET: 2 | 90 days supply | Qty: 90 | Fill #0

## 2017-07-08 MED FILL — TRIAMCINOLONE ACETONIDE 0.1: 0.1 | 30 days supply | Qty: 454 | Fill #0

## 2017-07-08 NOTE — Progress Notes (Addendum)
Subjective:    Patient ID: [name], male    DOB: 1951-04-30, 66 y.o.   MRN: [id number]  Chief Complaint  Patient presents with  . Hospitalization Follow-up    HPI   [name] is a 66 yo male here to follow-up after his hospitalization from 10/22-26 from the bypass graft of his right common femoral done 10/22. Unfortunately continues to have problems with mobility and pain postoperatively and his right dorsalis pedis pulse was lost and decreased ABI. Right arterial duplex showed the bypass was patent with biphasic but right AKA popliteal was noted to have significant stenosis rather than immediate surgical vision patient elected to go home in the ABI in 2 weeks with angiogram abnormality persisted.    He developed a rash 6d ago after he left the hospital.  He scratched/itched his right lower leg yesterday and today he has purpura all over. The day he came home from the hosp his leg was itching around the incision - he kept pressing on it through the incision but the following day he had diffuse purpura with blisters and No signs of easy bleeding. He has bruising from the IV in the hosp.  The incisions feel like they are healing well though they feel very tight and he still has the staples in - hoping they will come out at his appt in a wk.  HTN: Last started lisinopril 10 at visit 3 mos ago, on amlodipine 10.  HLD: restarted livalo '1mg'$  in June.  Last lipid panel stil above goal LDL <70 - his was 1145.  He's on livalo 1 mg daily but his lipid panel had got dramatically worse with LDL increase from 79 to 147 and non HDL from 134 to 227. His fasting blood sugar had also returned high at 137. He did have a history of preDM with A1c increase from 6 to 6.3 one year prior. He was asked to come in today as he called in to say he would be out of town for the next 4 months and is in need of medication refills.   DM: Him and his wife eats the same things, but they're not currently cooking the same  thing or eating together. She's trying the keto diet. DMII: Diagnosed .   Lab Results  Component Value Date   HGBA1C 6.8 03/31/2017   HGBA1C 6.3 02/21/2016   HGBA1C 6.0 02/22/2015   CBGs: fasting a.m. ; after meal  ; No hypoglycemic episodes.  Meter type:  Diet:  Exercising:  DM Med Regimen: Prior changes:   eGFR:  Baseline Cr:  Last checked . Microalb: Done 03/30/17. Normal. On acei Lipids:  LDL ,  non-HDL .  Last levels done  - were improving from prior. On statin - livalo.  Taking asa 81 qd.  Optho: Seen annually by - last exam  Feet: Monofilament exam done . Denies any no problems.  Not seen by podiatry prior.  Immunizations:  Influenza:  Pneumovax-23:  Past Medical History:  Diagnosis Date  . Arthritis   . Asthma    occ bronchitis  . Bronchitis   . COPD (chronic obstructive pulmonary disease) ([place])   . Coronary artery disease    a. s/p multiple prior PCIs; b. s/p CABG 06/2006 (L-LAD, S-Int and OM, S-PDA);  c. ETT-Myoview 4/13: low risk, no ischemia, EF 53%  . GERD (gastroesophageal reflux disease)   . Hx of echocardiogram    Echo 10/2006: normal LVF  . Hyperlipidemia   . Hypertension   .  Myocardial infarction ([place])   . Peripheral vascular disease ([place])   . Seasonal allergies   . Wears dentures   . Wears glasses    Past Surgical History:  Procedure Laterality Date  . ACNE CYST REMOVAL    . CARDIAC CATHETERIZATION    . COLONOSCOPY    . CORONARY ARTERY BYPASS GRAFT  2007  . CORONARY STENT PLACEMENT     x5 prior to cabg  . KNEE ARTHROSCOPY     rt knee  . MULTIPLE TOOTH EXTRACTIONS    . TONSILLECTOMY     Current Outpatient Medications on File Prior to Visit  Medication Sig Dispense Refill  . albuterol (PROVENTIL HFA;VENTOLIN HFA) 108 (90 Base) MCG/ACT inhaler Inhale 2 puffs into the lungs every 4 (four) hours as needed for wheezing or shortness of breath (cough, shortness of breath or wheezing.). 1 Inhaler 0  . amLODipine (NORVASC) 10 MG tablet TAKE 1  TABLET (10 MG TOTAL) BY MOUTH DAILY. 90 tablet 3  . aspirin EC 81 MG tablet Take 81 mg by mouth daily.    . cetirizine (ZYRTEC) 10 MG tablet Take 10 mg by mouth daily.     . cilostazol (PLETAL) 100 MG tablet TAKE 1 TABLET BY MOUTH TWICE DAILY. TAKE 30 MINUTES BEFORE OR 2 HOURS AFTER FOOD. 60 tablet 0  . diphenhydramine-acetaminophen (TYLENOL PM) 25-500 MG TABS tablet Take 2 tablets by mouth at bedtime.    Marland Kitchen lisinopril (PRINIVIL,ZESTRIL) 5 MG tablet Take 1 tablet (5 mg total) by mouth daily. 90 tablet 0  . LUTEIN PO Take 20 mg by mouth daily.     . Melatonin 5 MG CHEW Chew 1 each by mouth at bedtime.    . metoprolol succinate (TOPROL-XL) 50 MG 24 hr tablet TAKE 1 TABLET BY MOUTH DAILY. TAKE WITH OR IMMEDIATELY FOLLOWING A MEAL. 90 tablet 1  . Multiple Vitamins-Minerals (MULTIVITAMIN ADULTS PO) Take 1 tablet by mouth daily. VitaFusion    . naproxen sodium (ANAPROX) 220 MG tablet Take 220 mg by mouth 2 (two) times daily as needed (for pain.).     Marland Kitchen omeprazole (PRILOSEC) 20 MG capsule TAKE 1 CAPSULE (20 MG TOTAL) BY MOUTH DAILY. 30 capsule 0  . oxyCODONE (OXY IR/ROXICODONE) 5 MG immediate release tablet Take 1-2 tablets (5-10 mg total) by mouth every 6 (six) hours as needed for moderate pain. 20 tablet 0   No current facility-administered medications on file prior to visit.    Allergies  Allergen Reactions  . Codeine Nausea And Vomiting   Family History  Problem Relation Age of Onset  . Coronary artery disease Father        brothers, sisters  . Heart disease Father   . Diabetes Mother   . Heart disease Sister   . Heart disease Sister   . Heart disease Brother   . Anesthesia problems Neg Hx    Social History   Socioeconomic History  . Marital status: Married    Spouse name: None  . Number of children: None  . Years of education: None  . Highest education level: None  Social Needs  . Financial resource strain: None  . Food insecurity - worry: None  . Food insecurity - inability:  None  . Transportation needs - medical: None  . Transportation needs - non-medical: None  Occupational History  . Occupation: RETAIL  Tobacco Use  . Smoking status: Former Smoker    Packs/day: 1.00    Types: Cigarettes    Last attempt to quit: 03/22/2017  Years since quitting: 0.3  . Smokeless tobacco: Never Used  Substance and Sexual Activity  . Alcohol use: Yes    Alcohol/week: 0.0 oz    Comment: occasional - BEER OR WINE  . Drug use: No  . Sexual activity: None  Other Topics Concern  . 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    Depression screen [facility] 2/9 07/08/2017 03/31/2017 11/02/2016 02/21/2016 09/12/2015  Decreased Interest 0 0 0 0 0  Down, Depressed, Hopeless 0 0 0 0 0  PHQ - 2 Score 0 0 0 0 0      Review of Systems See hpi    Objective:   Physical Exam  Constitutional: He is oriented to person, place, and time. He appears well-developed and well-nourished. No distress.  HENT:  Head: Normocephalic and atraumatic.  Eyes: Conjunctivae are normal. Pupils are equal, round, and reactive to light. No scleral icterus.  Neck: Normal range of motion. Neck supple. No thyromegaly present.  Cardiovascular: Normal rate, regular rhythm, normal heart sounds and intact distal pulses.  Pulmonary/Chest: Effort normal and breath sounds normal. No respiratory distress.  Musculoskeletal: He exhibits no edema.  Lymphadenopathy:    He has no cervical adenopathy.  Neurological: He is alert and oriented to person, place, and time.  Skin: Skin is warm and dry. Rash noted. Rash is macular and vesicular. He is not diaphoretic.     Psychiatric: He has a normal mood and affect. His behavior is normal.       Results for orders placed or performed in visit on 07/08/17  POCT CBC  Result Value Ref Range   WBC 8.1 4.6 - 10.2 K/uL   Lymph, poc 1.4 0.6 - 3.4   POC LYMPH PERCENT 17.2 10 - 50 %L   MID (cbc)  0.2 0 - 0.9   POC MID % 2.5 0 - 12 %M   POC Granulocyte 6.5 2 - 6.9   Granulocyte percent 80.3 (A) 37 - 80 %G   RBC 4.21 (A) 4.69 - 6.13 M/uL   Hemoglobin 12.3 (A) 14.1 - 18.1 g/dL   HCT, POC 37.2 (A) 43.5 - 53.7 %   MCV 88.3 80 - 97 fL   MCH, POC 29.2 27 - 31.2 pg   MCHC 33.1 31.8 - 35.4 g/dL   RDW, POC 13.4 %   Platelet Count, POC 383 142 - 424 K/uL   MPV 6.5 0 - 99.8 fL  POCT glycosylated hemoglobin (Hb A1C)  Result Value Ref Range   Hemoglobin A1C 6.7    BP (!) 118/54   Pulse 66   Temp 98.3 F (36.8 C)   Resp 16   Ht '5\' 10"'$  (1.778 m)   Wt 228 lb 9.6 oz (103.7 kg)   SpO2 98%   BMI 32.80 kg/m   Assessment & Plan:   Cbc,  Glucose?  1. Post-op pain   2. Status post bypass graft of extremity - Rt CFA to AK pop bypass  3. Type 2 diabetes mellitus without complication, without long-term current use of insulin ([place])   4. Hyperlipidemia LDL goal <70   5. Essential hypertension   6. Medication monitoring encounter   7. Anemia due to other cause, not classified   8. Purpura ([place])      Orders Placed This Encounter  Procedures  . Basic metabolic panel    Order Specific Question:   Has the patient fasted?  Answer:   No  . Sedimentation Rate  . Protime-INR  . APTT  . POCT CBC  . POCT glycosylated hemoglobin (Hb A1C)    Meds ordered this encounter  Medications  . Pitavastatin Calcium 2 MG TABS    Sig: Take 1 tablet (2 mg total) by mouth daily.    Dispense:  90 tablet    Refill:  1  . triamcinolone cream (KENALOG) 0.1 %    Sig: Apply 1 application topically 4 (four) times daily as needed.    Dispense:  453 g    Refill:  0  . cephALEXin (KEFLEX) 500 MG capsule    Sig: Take 1 capsule (500 mg total) by mouth 4 (four) times daily.    Dispense:  20 capsule    Refill:  0    Delman Cheadle, M.D.  Primary Care at [facility] [address] [place], [place] [address] [phone] phone [phone] fax  07/15/17 6:29 AM

## 2017-07-08 NOTE — Patient Instructions (Addendum)
I suspect this is an allergic reaction. If it worsens at all, I would wonder if you should have the staples out - if it could be poss that you are reacting to the metal somehow - so start applying the triamcinolone cream but NOT to incision or open skin. Make sure you are not being exposed to new detergents or soaps. Just to be on the safe side, we can cover you with several days of antibiotics to ensure infection is not playing a role due to the severity and the risk of such a complication so start the keflex several times a day for 5 days. If it is worsening at all, return immediately.    IF you received an x-ray today, you will receive an invoice from [facility]. Please contact [facility] at [phone] with questions or concerns regarding your invoice.   IF you received labwork today, you will receive an invoice from [facility]. Please contact [facility] at [phone] with questions or concerns regarding your invoice.   Our billing staff will not be able to assist you with questions regarding bills from these companies.  You will be contacted with the lab results as soon as they are available. The fastest way to get your results is to activate your My Chart account. Instructions are located on the last page of this paperwork. If you have not heard from us regarding the results in 2 weeks, please contact this office.      Contact Dermatitis Dermatitis is redness, soreness, and swelling (inflammation) of the skin. Contact dermatitis is a reaction to certain substances that touch the skin. There are two types of contact dermatitis:  Irritant contact dermatitis. This type is caused by something that irritates your skin, such as dry hands from washing them too much. This type does not require previous exposure to the substance for a reaction to occur. This type is more common.  Allergic contact dermatitis. This type is caused by a substance that you are allergic to, such as a  nickel allergy or poison ivy. This type only occurs if you have been exposed to the substance (allergen) before. Upon a repeat exposure, your body reacts to the substance. This type is less common.  What are the causes? Many different substances can cause contact dermatitis. Irritant contact dermatitis is most commonly caused by exposure to:  Makeup.  Soaps.  Detergents.  Bleaches.  Acids.  Metal salts, such as nickel.  Allergic contact dermatitis is most commonly caused by exposure to:  Poisonous plants.  Chemicals.  Jewelry.  Latex.  Medicines.  Preservatives in products, such as clothing.  What increases the risk? This condition is more likely to develop in:  People who have jobs that expose them to irritants or allergens.  People who have certain medical conditions, such as asthma or eczema.  What are the signs or symptoms? Symptoms of this condition may occur anywhere on your body where the irritant has touched you or is touched by you. Symptoms include:  Dryness or flaking.  Redness.  Cracks.  Itching.  Pain or a burning feeling.  Blisters.  Drainage of small amounts of blood or clear fluid from skin cracks.  With allergic contact dermatitis, there may also be swelling in areas such as the eyelids, mouth, or genitals. How is this diagnosed? This condition is diagnosed with a medical history and physical exam. A patch skin test may be performed to help determine the cause. If the condition is related to your job, you  may need to see an occupational medicine specialist. How is this treated? Treatment for this condition includes figuring out what caused the reaction and protecting your skin from further contact. Treatment may also include:  Steroid creams or ointments. Oral steroid medicines may be needed in more severe cases.  Antibiotics or antibacterial ointments, if a skin infection is present.  Antihistamine lotion or an antihistamine taken by  mouth to ease itching.  A bandage (dressing).  Follow these instructions at home: Skin Care  Moisturize your skin as needed.  Apply cool compresses to the affected areas.  Try taking a bath with: ? Epsom salts. Follow the instructions on the packaging. You can get these at your local pharmacy or grocery store. ? Baking soda. Pour a small amount into the bath as directed by your health care provider. ? Colloidal oatmeal. Follow the instructions on the packaging. You can get this at your local pharmacy or grocery store.  Try applying baking soda paste to your skin. Stir water into baking soda until it reaches a paste-like consistency.  Do not scratch your skin.  Bathe less frequently, such as every other day.  Bathe in lukewarm water. Avoid using hot water. Medicines  Take or apply over-the-counter and prescription medicines only as told by your health care provider.  If you were prescribed an antibiotic medicine, take or apply your antibiotic as told by your health care provider. Do not stop using the antibiotic even if your condition starts to improve. General instructions  Keep all follow-up visits as told by your health care provider. This is important.  Avoid the substance that caused your reaction. If you do not know what caused it, keep a journal to try to track what caused it. Write down: ? What you eat. ? What cosmetic products you use. ? What you drink. ? What you wear in the affected area. This includes jewelry.  If you were given a dressing, take care of it as told by your health care provider. This includes when to change and remove it. Contact a health care provider if:  Your condition does not improve with treatment.  Your condition gets worse.  You have signs of infection such as swelling, tenderness, redness, soreness, or warmth in the affected area.  You have a fever.  You have new symptoms. Get help right away if:  You have a severe headache, neck  pain, or neck stiffness.  You vomit.  You feel very sleepy.  You notice red streaks coming from the affected area.  Your bone or joint underneath the affected area becomes painful after the skin has healed.  The affected area turns darker.  You have difficulty breathing. This information is not intended to replace advice given to you by your health care provider. Make sure you discuss any questions you have with your health care provider. Document Released: 08/21/2000 Document Revised: 01/30/2016 Document Reviewed: 01/09/2015 Elsevier Interactive Patient Education  2018 [employer].

## 2017-07-09 ENCOUNTER — Ambulatory Visit: Payer: 59 | Admitting: Physician Assistant

## 2017-07-09 DIAGNOSIS — D692 Other nonthrombocytopenic purpura: Secondary | ICD-10-CM | POA: Diagnosis not present

## 2017-07-09 LAB — BASIC METABOLIC PANEL
BUN/Creatinine Ratio: 16 (ref 10–24)
BUN: 16 mg/dL (ref 8–27)
CALCIUM: 9.2 mg/dL (ref 8.6–10.2)
CHLORIDE: 99 mmol/L (ref 96–106)
CO2: 23 mmol/L (ref 20–29)
Creatinine, Ser: 0.97 mg/dL (ref 0.76–1.27)
GFR calc Af Amer: 94 mL/min/{1.73_m2} (ref >59)
GFR calc non Af Amer: 82 mL/min/{1.73_m2} (ref >59)
GLUCOSE: 111 mg/dL — AB (ref 65–99)
POTASSIUM: 4.9 mmol/L (ref 3.5–5.2)
SODIUM: 138 mmol/L (ref 134–144)

## 2017-07-10 LAB — SEDIMENTATION RATE: Sed Rate: 26 mm/hr (ref 0–30)

## 2017-07-10 LAB — APTT: aPTT: 31 s (ref 24–33)

## 2017-07-10 LAB — PROTIME-INR
INR: 1 (ref 0.8–1.2)
PROTHROMBIN TIME: 10.3 s (ref 9.1–12.0)

## 2017-07-12 ENCOUNTER — Telehealth: Payer: Self-pay | Admitting: *Deleted

## 2017-07-12 NOTE — Telephone Encounter (Signed)
Patient called c/o only 2 pain pills left out of Rx or Percocet 30 given 10/29. Claims "leg hurts like crazy" after 8 hours. States most pain is at staples. Informed patient to elevate leg as much as possible and would need to bee seen for any more pain medication. Told him I would check for appt. Availability  and call him back.

## 2017-07-12 NOTE — Telephone Encounter (Signed)
No answer left msg for patient to call me back.

## 2017-07-12 NOTE — Telephone Encounter (Signed)
Patient called back stated " I was out when you called". Patient states absolutely no sign or symptoms of infection,incision is healing well.  just pain at staple line. Suggested elevating legs to relieve pain and non narcotic medication for pain relief. Patient confirmed he does have a home health nurse visit I asked him to have that nurse look at and could call us if there are any problems and suggested he may be  doing too much walking too soon. He has a follow up appt. With Dr.  Imogene Burn next week.

## 2017-07-16 ENCOUNTER — Other Ambulatory Visit: Payer: Self-pay | Admitting: *Deleted

## 2017-07-16 ENCOUNTER — Other Ambulatory Visit: Payer: Self-pay | Admitting: Family Medicine

## 2017-07-16 DIAGNOSIS — K219 Gastro-esophageal reflux disease without esophagitis: Secondary | ICD-10-CM

## 2017-07-17 NOTE — Progress Notes (Signed)
    Postoperative Visit   History of Present Illness   [name] is a 66 y.o. year old male who presents for postoperative follow-up for: R CFA to AK pop bypass (06/28/17).  The patient's wounds are healed.  The patient notes great improvement lower extremity symptoms.  The patient is able to complete their activities of daily living.  The patient's current symptoms are: irritation at staples.  There was some conflicting non-invasive studies in the hospital post-op, so pt had a repeat ABI yesterday   For VQI Use Only   PRE-ADM LIVING: Home  AMB STATUS: Ambulatory   Physical Examination   Vitals:   07/23/17 0904 07/23/17 0907  BP: (!) 152/77 (!) 161/81  Pulse: 73 70  Resp: 18   Temp: (!) 97.5 F (36.4 C)   TempSrc: Oral   SpO2: 98%   Weight: 225 lb (102.1 kg)   Height: 5\' 10"  (1.778 m)     RLE: Incisions are healed, pedal pulses are faintly palpable, staples in AK pop incision, irritation at staples  LLE: vein harvest incision, staples in place, irritation at staples   Non-invasive Vascular Imaging   ABI (07/21/17)  R:   ABI: 0.85 (0.65),   PT: bi  DP: bi  TBI:  0.46  L:   ABI: 0.80 (0.82),   PT: bi  DP: bi  TBI: 0.43    Medical Decision Making   [name] is a 66 y.o. year old male who presents s/p R CFA to AK pop bypass.   The patient's bypass incisions are healing appropriately great improvement of of pre-operative symptoms. I discussed in depth with the patient the nature of atherosclerosis, and emphasized the importance of maximal medical management including strict control of blood pressure, blood glucose, and lipid levels, obtaining regular exercise, and cessation of smoking.  The patient is aware that without maximal medical management the underlying atherosclerotic disease process will progress, limiting the benefit of any interventions. The patient's surveillance will included ABI and bypass duplex studies which will be  completed in: 3 months, at which time the patient will be re-evaluated.   I emphasized the importance of routine surveillance of the patient's bypass, as the vascular surgery literature emphasize the improved patency possible with assisted primary patency procedures versus secondary patency procedures. The patient agrees to participate in their maximal medical care and routine surveillance. Thank you for allowing [place] to participate in this patient's care.   [name], MD, FACS [facility] [place] Office: [phone] Pager: [phone]

## 2017-07-21 ENCOUNTER — Ambulatory Visit (HOSPITAL_COMMUNITY)
Admission: RE | Admit: 2017-07-21 | Discharge: 2017-07-21 | Disposition: A | Discharge status: Home / Self Care | Payer: 59 | Source: Ambulatory Visit | Attending: Family | Admitting: Family

## 2017-07-21 DIAGNOSIS — Z48812 Encounter for surgical aftercare following surgery on the circulatory system: Secondary | ICD-10-CM | POA: Diagnosis not present

## 2017-07-21 DIAGNOSIS — I10 Essential (primary) hypertension: Secondary | ICD-10-CM | POA: Insufficient documentation

## 2017-07-21 DIAGNOSIS — Z87891 Personal history of nicotine dependence: Secondary | ICD-10-CM | POA: Insufficient documentation

## 2017-07-21 DIAGNOSIS — Z95828 Presence of other vascular implants and grafts: Secondary | ICD-10-CM | POA: Insufficient documentation

## 2017-07-21 DIAGNOSIS — E785 Hyperlipidemia, unspecified: Secondary | ICD-10-CM | POA: Diagnosis not present

## 2017-07-23 ENCOUNTER — Ambulatory Visit (INDEPENDENT_AMBULATORY_CARE_PROVIDER_SITE_OTHER): Payer: Self-pay | Admitting: Vascular Surgery

## 2017-07-23 ENCOUNTER — Encounter: Payer: Self-pay | Admitting: Vascular Surgery

## 2017-07-23 ENCOUNTER — Other Ambulatory Visit: Payer: Self-pay

## 2017-07-23 VITALS — BP 161/81 | HR 70 | Temp 97.5°F | Resp 18 | Ht 70.0 in | Wt 225.0 lb

## 2017-07-23 DIAGNOSIS — I70213 Atherosclerosis of native arteries of extremities with intermittent claudication, bilateral legs: Secondary | ICD-10-CM

## 2017-07-26 NOTE — Addendum Note (Signed)
Addended by: Burton Apley A on: 07/26/2017 04:25 PM   Modules accepted: Orders

## 2017-08-04 ENCOUNTER — Other Ambulatory Visit: Payer: Self-pay

## 2017-08-04 ENCOUNTER — Telehealth: Payer: Self-pay | Admitting: Family Medicine

## 2017-08-04 DIAGNOSIS — K219 Gastro-esophageal reflux disease without esophagitis: Secondary | ICD-10-CM

## 2017-08-04 MED ORDER — OMEPRAZOLE 20 MG PO CPDR: 20.0000 mg | DELAYED_RELEASE_CAPSULE | Freq: Every day | ORAL | 2 refills | Status: DC

## 2017-08-04 MED ORDER — LISINOPRIL 5 MG PO TABS: 5.0000 mg | ORAL_TABLET | Freq: Every day | ORAL | 0 refills | Status: DC

## 2017-08-04 MED FILL — LISINOPRIL 5 MG TABLET: 5 | 90 days supply | Qty: 90 | Fill #0

## 2017-08-04 MED FILL — OMEPRAZOLE 20 MG CAP: 20 | 90 days supply | Qty: 90 | Fill #0

## 2017-08-04 NOTE — Telephone Encounter (Signed)
Copied from CRM [id number]. Topic: Quick Communication - See Telephone Encounter >> Aug 04, 2017 11:59 AM Guinevere Ferrari, NT wrote: CRM for notification. See Telephone encounter for: Pt. Is calling in for a refill for  lisinopril (PRINIVIL,ZESTRIL) 5 MG tablet and omeprazole (PRILOSEC) 20 MG capsule. Pt uses [facility] 08/04/17.

## 2017-09-02 MED FILL — METOPROLOL SUCC ER 50 MG TA: 50 | 90 days supply | Qty: 90 | Fill #1

## 2017-10-15 MED FILL — LIVALO 2 MG TABLET: 2 | 90 days supply | Qty: 90 | Fill #1

## 2017-10-20 ENCOUNTER — Encounter (HOSPITAL_COMMUNITY): Payer: Medicare Other

## 2017-10-20 ENCOUNTER — Ambulatory Visit: Payer: Medicare Other | Admitting: Vascular Surgery

## 2017-10-20 DIAGNOSIS — Z95828 Presence of other vascular implants and grafts: Secondary | ICD-10-CM | POA: Insufficient documentation

## 2017-10-20 NOTE — Addendum Note (Signed)
Addended by: Sherren Mocha on: 10/20/2017 03:04 AM   Modules accepted: Level of Service

## 2017-10-25 ENCOUNTER — Other Ambulatory Visit: Payer: Self-pay | Admitting: Family Medicine

## 2017-10-25 DIAGNOSIS — K219 Gastro-esophageal reflux disease without esophagitis: Secondary | ICD-10-CM

## 2017-10-25 MED FILL — AMLODIPINE BESYLATE 10 MG T: 10 | 90 days supply | Qty: 90 | Fill #2

## 2017-11-08 NOTE — Progress Notes (Signed)
Established Previous Bypass   History of Present Illness   [name] is a 67 y.o. (1950-10-09) male who presents with chief complaint: chest pain.  Previous operation(s) include: R CFA to AK pop BPG with NR L GSV for occupation limiting intermittent claudication (06/28/17).  The patient's leg symptoms have resolved.  He is ambulating so much now he is getting chest pain.  The patient is going to follow up with his Cardiologist.  The patient's treatment regimen currently included: maximal medical management.  The patient's PMH, PSH, SH, and FamHx are unchanged from 07/23/17.  Current Outpatient Medications  Medication Sig Dispense Refill  . albuterol (PROVENTIL HFA;VENTOLIN HFA) 108 (90 Base) MCG/ACT inhaler Inhale 2 puffs into the lungs every 4 (four) hours as needed for wheezing or shortness of breath (cough, shortness of breath or wheezing.). 1 Inhaler 0  . amLODipine (NORVASC) 10 MG tablet TAKE 1 TABLET (10 MG TOTAL) BY MOUTH DAILY. 90 tablet 3  . aspirin EC 81 MG tablet Take 81 mg by mouth daily.    . cephALEXin (KEFLEX) 500 MG capsule Take 1 capsule (500 mg total) by mouth 4 (four) times daily. 20 capsule 0  . cetirizine (ZYRTEC) 10 MG tablet Take 10 mg by mouth daily.     . cilostazol (PLETAL) 100 MG tablet TAKE 1 TABLET BY MOUTH TWICE DAILY. TAKE 30 MINUTES BEFORE OR 2 HOURS AFTER FOOD. 60 tablet 0  . diphenhydramine-acetaminophen (TYLENOL PM) 25-500 MG TABS tablet Take 2 tablets by mouth at bedtime.    Marland Kitchen lisinopril (PRINIVIL,ZESTRIL) 5 MG tablet Take 1 tablet (5 mg total) by mouth daily. 90 tablet 0  . LUTEIN PO Take 20 mg by mouth daily.     . Melatonin 5 MG CHEW Chew 1 each by mouth at bedtime.    . metoprolol succinate (TOPROL-XL) 50 MG 24 hr tablet TAKE 1 TABLET BY MOUTH DAILY. TAKE WITH OR IMMEDIATELY FOLLOWING A MEAL. 90 tablet 1  . Multiple Vitamins-Minerals (MULTIVITAMIN ADULTS PO) Take 1 tablet by mouth daily. V[employer]usion    . naproxen sodium (ANAPROX) 220 MG tablet  Take 220 mg by mouth 2 (two) times daily as needed (for pain.).     Marland Kitchen omeprazole (PRILOSEC) 20 MG capsule Take 1 capsule (20 mg total) by mouth daily. 30 capsule 2  . oxyCODONE (OXY IR/ROXICODONE) 5 MG immediate release tablet Take 1-2 tablets (5-10 mg total) by mouth every 6 (six) hours as needed for moderate pain. 20 tablet 0  . Pitavastatin Calcium 2 MG TABS Take 1 tablet (2 mg total) by mouth daily. 90 tablet 1  . triamcinolone cream (KENALOG) 0.1 % Apply 1 application topically 4 (four) times daily as needed. 453 g 0   No current facility-administered medications for this visit.     On ROS today: +exertional chest pain, no intermittent claudication    Physical Examination   Vitals:   11/10/17 1508  BP: 139/74  Pulse: (!) 53  Resp: 20  Temp: (!) 97 F (36.1 C)  TempSrc: Oral  SpO2: 98%  Weight: 234 lb (106.1 kg)  Height: 5\' 10"  (1.778 m)   Body mass index is 33.58 kg/m.  General Alert, O x 3, WD, NAD  Pulmonary Sym exp, good B air movt, CTA B  Cardiac RRR, Nl S1, S2, no Murmurs, No rubs, No S3,S4  Vascular Vessel Right Left  Radial Palpable Palpable  Brachial Palpable Palpable  Carotid Palpable, No Bruit Palpable, No Bruit  Aorta Not palpable N/A  Femoral Palpable Palpable  Popliteal Not palpable Not palpable  PT Palpable Palpable  DP Palpable Palpable    Gastro- intestinal soft, non-distended, non-tender to palpation, No guarding or rebound, no HSM, no masses, no CVAT B, No palpable prominent aortic pulse,    Musculo- skeletal M/S 5/5 throughout  , Extremities without ischemic changes  , No edema present, No visible varicosities , No Lipodermatosclerosis present  Neurologic Pain and light touch intact in extremities , Motor exam as listed above    Non-Invasive Vascular Imaging ABI (11/10/2017)  R:   ABI: 1.10 (0.85),   PT: tri  DP: tri  TBI:  0.62  L:   ABI: 1.08 (0.80),   PT: tri  DP: tri  TBI: 0.69  RLE Bypass Duplex (11/10/2017) Right  Graft:  PSV cm/s Stenosis Waveform Comments  Inflow 135  triphasic    Prox anastomosis 123  triphasic    Proximal graft 87  triphasic    Mid graft 76  triphasic    Distal graft 113  triphasic    Distal anastomosis 105  biphasic    Outflow 210  triphasic      Final Interpretation: Right: Normal examination. No evidence of arterial occlusive disease s/p bypass graft   Slight tortuosity in the distal anastamosis / native artery as well as diameter change of artery may account for increased velocty.     Medical Decision Making   [name] is a 67 y.o. male who presents with: s/p R CFA to AK pop BPG with NR L GSV for occupation limiting intermittent claudication, possible exertional angina   Based on the patient's vascular studies and examination, I have offered the patient: BLE ABI and bypass duplex in 3 months.  I wrote the patient for Nitroglycerin 1 tab SL q5 min prn angina x 3.  Pt is going to schedule f/u with Dr. [name] ASAP.  I discussed in depth with the patient the nature of atherosclerosis, and emphasized the importance of maximal medical management including strict control of blood pressure, blood glucose, and lipid levels, obtaining regular exercise, and cessation of smoking.    The patient is aware that without maximal medical management the underlying atherosclerotic disease process will progress, limiting the benefit of any interventions.  I discussed in depth with the patient the need for long term surveillance to improve the primary assisted patency of his bypass.  The patient agrees to cooperate with such. The patient is currently Pitavastin  The patient is currently on an anti-platelet: ASA.  Thank you for allowing [place] to participate in this patient's care.   [name], MD, FACS [facility] [place] Office: [phone] Pager: [phone]

## 2017-11-10 ENCOUNTER — Ambulatory Visit (INDEPENDENT_AMBULATORY_CARE_PROVIDER_SITE_OTHER)
Admission: RE | Admit: 2017-11-10 | Discharge: 2017-11-10 | Disposition: A | Discharge status: Home / Self Care | Payer: 59 | Source: Ambulatory Visit | Attending: Vascular Surgery | Admitting: Vascular Surgery

## 2017-11-10 ENCOUNTER — Encounter: Payer: Self-pay | Admitting: Vascular Surgery

## 2017-11-10 ENCOUNTER — Ambulatory Visit: Payer: 59 | Admitting: Vascular Surgery

## 2017-11-10 ENCOUNTER — Other Ambulatory Visit: Payer: Self-pay

## 2017-11-10 ENCOUNTER — Ambulatory Visit (HOSPITAL_COMMUNITY)
Admission: RE | Admit: 2017-11-10 | Discharge: 2017-11-10 | Disposition: A | Discharge status: Home / Self Care | Payer: 59 | Source: Ambulatory Visit | Attending: Vascular Surgery | Admitting: Vascular Surgery

## 2017-11-10 VITALS — BP 139/74 | HR 53 | Temp 97.0°F | Resp 20 | Ht 70.0 in | Wt 234.0 lb

## 2017-11-10 DIAGNOSIS — Z87891 Personal history of nicotine dependence: Secondary | ICD-10-CM | POA: Insufficient documentation

## 2017-11-10 DIAGNOSIS — R0989 Other specified symptoms and signs involving the circulatory and respiratory systems: Secondary | ICD-10-CM | POA: Insufficient documentation

## 2017-11-10 DIAGNOSIS — R9389 Abnormal findings on diagnostic imaging of other specified body structures: Secondary | ICD-10-CM | POA: Insufficient documentation

## 2017-11-10 DIAGNOSIS — E785 Hyperlipidemia, unspecified: Secondary | ICD-10-CM | POA: Diagnosis not present

## 2017-11-10 DIAGNOSIS — I70213 Atherosclerosis of native arteries of extremities with intermittent claudication, bilateral legs: Secondary | ICD-10-CM

## 2017-11-10 DIAGNOSIS — I10 Essential (primary) hypertension: Secondary | ICD-10-CM | POA: Insufficient documentation

## 2017-11-10 MED ORDER — NITROGLYCERIN 0.4 MG SL SUBL: 0.4000 mg | SUBLINGUAL_TABLET | SUBLINGUAL | 0 refills | Status: DC | PRN

## 2017-11-10 MED FILL — NITROGLYCERIN 0.4 MG TAB SL: 0.4 | 5 days supply | Qty: 25 | Fill #0

## 2017-11-29 NOTE — Progress Notes (Deleted)
Cardiology Office Note   Date:  11/29/2017   ID:  [name], DOB [birth year], MRN [id number]  PCP:  Sherren Mocha, MD  Cardiologist:  Dr.Arida  No chief complaint on file.    History of Present Illness: [name] is a 67 y.o. male who presents for ongoing assessment and management of coronary artery disease, status post multiple prior PCI's, status post CABG in 06/2006 (LIMA to LAD, SVG to internal and OM, SVG to PDA); peripheral arterial disease, with main complaint of right greater than left leg pain.  Describes it as cramping and heaviness with walking, and with classic intermittent claudication symptoms.  The patient underwent lower extremity angiogram on 04/28/2017.  Conclusion   1. Small abdominal aortic aneurysm. Mild nonobstructive iliac disease 2. Right lower extremity: Long occlusion of the SFA close to its origin with reconstitution via collaterals from collaterals from the popliteal in the distal SFA. One-vessel runoff below the knee via the peroneal artery. 3. No significant SFA or popliteal disease with two-vessel runoff below the knee via the anterior and peroneal arteries. The anterior tibial artery has 80% proximal stenosis.  Recommendations: The right SFA occlusion is long and not ideal for endovascular intervention. Also from a technical standpoint the aortic bifurcation is too high with very steep angulation which probably makes it extremely difficult to go from the contralateral approach. The antegrade ipsilateral approach is limited by the fact that the SFA is occluded close to the ostium.   Due to worsening symptoms, the patient scheduled for right femoral common femoral artery to above-the-knee popliteal artery with non-reversed left greater saphenous vein completed on 06/28/2017 by Dr. Imogene Burn.  The patient was to have a follow-up ABI for routine surveillance of the patient's bypass.  The patient was seen last by Dr. Imogene Burn, on 11/10/2017, the patient was having  some exertional angina, was written for nitroglycerin as needed.  Unfortunately the patient continued to smoke.   Past Medical History:  Diagnosis Date  . Arthritis   . Asthma    occ bronchitis  . Bronchitis   . COPD (chronic obstructive pulmonary disease) (HCC)   . Coronary artery disease    a. s/p multiple prior PCIs; b. s/p CABG 06/2006 (L-LAD, S-Int and OM, S-PDA);  c. ETT-Myoview 4/13: low risk, no ischemia, EF 53%  . GERD (gastroesophageal reflux disease)   . Hx of echocardiogram    Echo 10/2006: normal LVF  . Hyperlipidemia   . Hypertension   . Myocardial infarction (HCC)   . Peripheral vascular disease (HCC)   . Seasonal allergies   . Wears dentures   . Wears glasses     Past Surgical History:  Procedure Laterality Date  . ABDOMINAL AORTOGRAM N/A 04/28/2017   Procedure: ABDOMINAL AORTOGRAM;  Surgeon: Iran Ouch, MD;  Location: [facility];  Service: Cardiovascular;  Laterality: N/A;  . ACNE CYST REMOVAL    . CARDIAC CATHETERIZATION    . COLONOSCOPY    . CORONARY ARTERY BYPASS GRAFT  2007  . CORONARY STENT PLACEMENT     x5 prior to cabg  . FEMORAL-POPLITEAL BYPASS GRAFT Right 06/28/2017   Procedure: BYPASS GRAFT COMMON FEMORAL- ABOVE KNEE POPLITEAL ARTERY WITH LEFT GREATER SAPHENOUS VEIN;  Surgeon: Fransisco Hertz, MD;  Location: [facility] OR;  Service: Vascular;  Laterality: Right;  . KNEE ARTHROSCOPY     rt knee  . LOWER EXTREMITY ANGIOGRAPHY Bilateral 04/28/2017   Procedure: Lower Extremity Angiography;  Surgeon: Iran Ouch, MD;  Location: [facility];  Service: Cardiovascular;  Laterality: Bilateral;  . MULTIPLE TOOTH EXTRACTIONS    . SHOULDER ARTHROSCOPY  12/14/2011   removed bone spurs and repositioned muscle.   . TONSILLECTOMY       Current Outpatient Medications  Medication Sig Dispense Refill  . albuterol (PROVENTIL HFA;VENTOLIN HFA) 108 (90 Base) MCG/ACT inhaler Inhale 2 puffs into the lungs every 4 (four) hours as needed for wheezing or  shortness of breath (cough, shortness of breath or wheezing.). 1 Inhaler 0  . amLODipine (NORVASC) 10 MG tablet TAKE 1 TABLET (10 MG TOTAL) BY MOUTH DAILY. 90 tablet 3  . aspirin EC 81 MG tablet Take 81 mg by mouth daily.    . cephALEXin (KEFLEX) 500 MG capsule Take 1 capsule (500 mg total) by mouth 4 (four) times daily. (Patient not taking: Reported on 11/10/2017) 20 capsule 0  . cetirizine (ZYRTEC) 10 MG tablet Take 10 mg by mouth daily.     . cilostazol (PLETAL) 100 MG tablet TAKE 1 TABLET BY MOUTH TWICE DAILY. TAKE 30 MINUTES BEFORE OR 2 HOURS AFTER FOOD. 60 tablet 0  . diphenhydramine-acetaminophen (TYLENOL PM) 25-500 MG TABS tablet Take 2 tablets by mouth at bedtime.    Marland Kitchen lisinopril (PRINIVIL,ZESTRIL) 5 MG tablet Take 1 tablet (5 mg total) by mouth daily. 90 tablet 0  . LUTEIN PO Take 20 mg by mouth daily.     . Melatonin 5 MG CHEW Chew 1 each by mouth at bedtime.    . metoprolol succinate (TOPROL-XL) 50 MG 24 hr tablet TAKE 1 TABLET BY MOUTH DAILY. TAKE WITH OR IMMEDIATELY FOLLOWING A MEAL. 90 tablet 1  . Multiple Vitamins-Minerals (MULTIVITAMIN ADULTS PO) Take 1 tablet by mouth daily. VitaFusion    . naproxen sodium (ANAPROX) 220 MG tablet Take 220 mg by mouth 2 (two) times daily as needed (for pain.).     Marland Kitchen nitroGLYCERIN (NITROSTAT) 0.4 MG SL tablet Place 1 tablet (0.4 mg total) under the tongue every 5 (five) minutes as needed for chest pain. 30 tablet 0  . omeprazole (PRILOSEC) 20 MG capsule Take 1 capsule (20 mg total) by mouth daily. 30 capsule 2  . oxyCODONE (OXY IR/ROXICODONE) 5 MG immediate release tablet Take 1-2 tablets (5-10 mg total) by mouth every 6 (six) hours as needed for moderate pain. (Patient not taking: Reported on 11/10/2017) 20 tablet 0  . Pitavastatin Calcium 2 MG TABS Take 1 tablet (2 mg total) by mouth daily. 90 tablet 1  . triamcinolone cream (KENALOG) 0.1 % Apply 1 application topically 4 (four) times daily as needed. 453 g 0   No current facility-administered  medications for this visit.     Allergies:   Codeine    Social History:  The patient  reports that he quit smoking about 8 months ago. His smoking use included cigarettes. He smoked 1.00 pack per day. He has never used smokeless tobacco. He reports that he drinks alcohol. He reports that he does not use drugs.   Family History:  The patient's family history includes Coronary artery disease in his father; Diabetes in his mother; Heart disease in his brother, father, sister, and sister.    ROS: All other systems are reviewed and negative. Unless otherwise mentioned in H&P    PHYSICAL EXAM: VS:  There were no vitals taken for this visit. , BMI There is no height or weight on file to calculate BMI. GEN: Well nourished, well developed, in no acute distress  HEENT: normal  Neck: no JVD, carotid bruits, or masses Cardiac: ***RRR; no murmurs, rubs, or gallops,no edema  Respiratory:  clear to auscultation bilaterally, normal work of breathing GI: soft, nontender, nondistended, + BS MS: no deformity or atrophy  Skin: warm and dry, no rash Neuro:  Strength and sensation are intact Psych: euthymic mood, full affect   EKG:  EKG {ACTION; IS/IS [id number]} ordered today. The ekg ordered today demonstrates ***   Recent Labs: 06/28/2017: ALT 21 06/29/2017: Platelets 187 07/08/2017: BUN 16; Creatinine, Ser 0.97; Hemoglobin 12.3; Potassium 4.9; Sodium 138    Lipid Panel    Component Value Date/Time   CHOL 185 06/29/2017 0139   CHOL 239 (H) 03/31/2017 1112   TRIG 183 (H) 06/29/2017 0139   HDL 34 (L) 06/29/2017 0139   HDL 36 (L) 03/31/2017 1112   CHOLHDL 5.4 06/29/2017 0139   VLDL 37 06/29/2017 0139   LDLCALC 114 (H) 06/29/2017 0139   LDLCALC Comment 03/31/2017 1112   LDLDIRECT 91.0 06/16/2013 0906      Wt Readings from Last 3 Encounters:  11/10/17 234 lb (106.1 kg)  07/23/17 225 lb (102.1 kg)  07/08/17 228 lb 9.6 oz (103.7 kg)      Other studies Reviewed:    ASSESSMENT  AND PLAN:  1.  ***   Current medicines are reviewed at length with the patient today.    Labs/ tests ordered today include: *** Bettey Mare. Liborio Nixon, ANP, AACC   11/29/2017 4:34 PM    [facility] [address] [place], [place] [address] Phone: [phone]; Fax: [phone]

## 2017-12-01 ENCOUNTER — Ambulatory Visit: Payer: 59 | Admitting: Adult Health

## 2017-12-13 ENCOUNTER — Other Ambulatory Visit: Payer: Self-pay | Admitting: Family Medicine

## 2017-12-14 NOTE — Progress Notes (Signed)
Cardiology Office Note   Date:  12/15/2017   ID:  [name], DOB Jul 15, 1951, MRN [id number]  PCP:  Sherren Mocha, MD  Cardiologist:  [name]  PAD: Kirke Corin Chief Complaint  Patient presents with  . PAD  . Coronary Artery Disease     History of Present Illness: [name] is a 67 y.o. male who presents for ongoing assessment and management of coronary artery disease, history of multiple prior PCI's, ultimately had a CABG in 06/2016 ((LIMA to the LAD, sequential saphenous vein graft to the intermediate and obtuse marginal, and a saphenous vein graft to the PDA).  Last seen by Dr. [name], February 2017.  He has been seen most recently by Dr. Kirke Corin, in the setting of PAD.  The patient was having bilateral calf claudication symptoms worse on the right over the last year which has been progressing.  He was seen by Dr. Renato Gails on 04/20/2017, with documentation of ABI which revealed moderately reduced right side at 0.65 and mildly reduced on the left is 0.82.  The patient was placed on Pletal and did report some improvement on the left leg but not on the right.  The patient had angiography, aortogram with runoff, which revealed mild nonobstructive iliac disease, a long occlusion of the SFA on the right close to the origin constitution via collaterals from collaterals to find in the distal SFA.  On the left the patient had no significant SFA or popliteal disease with two-vessel runoff below the knee.  The anterior and peroneal arteries.  The anterior tibial artery was 80% proximally stenosed.  He was subsequently referred to Dr. [name], and was found to be a candidate for right CFA to AK popliteal bypass with runoff via the artery.  Surgery was completed on 06/28/2017 with right common femoral artery to above-the-knee popliteal artery bypass with non-reversed left greater saphenous vein.  He has since been followed by Dr. Imogene Burn.,  Last seen 11/10/2017.  He has had recurrent chest pain since  returning to work after having LE bypass.  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 He has run out of some of his medications to include metoprolol, lisinopril, and nitroglycerin.  He is uncertain about his statin therapy.  He has had some generalized fatigue as well. The patient has stopped smoking.  Past Medical History:  Diagnosis Date  . Arthritis   . Asthma    occ bronchitis  . Bronchitis   . COPD (chronic obstructive pulmonary disease) (HCC)   . Coronary artery disease    a. s/p multiple prior PCIs; b. s/p CABG 06/2006 (L-LAD, S-Int and OM, S-PDA);  c. ETT-Myoview 4/13: low risk, no ischemia, EF 53%  . GERD (gastroesophageal reflux disease)   . Hx of echocardiogram    Echo 10/2006: normal LVF  . Hyperlipidemia   . Hypertension   . Myocardial infarction (HCC)   . Peripheral vascular disease (HCC)   . Seasonal allergies   . Wears dentures   . Wears glasses     Past Surgical History:  Procedure Laterality Date  . ABDOMINAL AORTOGRAM N/A 04/28/2017   Procedure: ABDOMINAL AORTOGRAM;  Surgeon: Iran Ouch, MD;  Location: [facility];  Service: Cardiovascular;  Laterality: N/A;  . ACNE CYST REMOVAL    . CARDIAC CATHETERIZATION    . COLONOSCOPY    . CORONARY ARTERY BYPASS GRAFT  2007  . CORONARY STENT PLACEMENT     x5  prior to cabg  . FEMORAL-POPLITEAL BYPASS GRAFT Right 06/28/2017   Procedure: BYPASS GRAFT COMMON FEMORAL- ABOVE KNEE POPLITEAL ARTERY WITH LEFT GREATER SAPHENOUS VEIN;  Surgeon: Fransisco Hertz, MD;  Location: [facility] OR;  Service: Vascular;  Laterality: Right;  . KNEE ARTHROSCOPY     rt knee  . LOWER EXTREMITY ANGIOGRAPHY Bilateral 04/28/2017   Procedure: Lower Extremity Angiography;  Surgeon: Iran Ouch, MD;  Location: [facility] CV LAB;  Service: Cardiovascular;  Laterality: Bilateral;  . MULTIPLE TOOTH EXTRACTIONS    . SHOULDER ARTHROSCOPY  12/14/2011   removed bone spurs and repositioned  muscle.   . TONSILLECTOMY       Current Outpatient Medications  Medication Sig Dispense Refill  . albuterol (PROVENTIL HFA;VENTOLIN HFA) 108 (90 Base) MCG/ACT inhaler Inhale 2 puffs into the lungs every 4 (four) hours as needed for wheezing or shortness of breath (cough, shortness of breath or wheezing.). 1 Inhaler 0  . amLODipine (NORVASC) 10 MG tablet Take 1 tablet (10 mg total) by mouth daily. 90 tablet 1  . aspirin EC 81 MG tablet Take 81 mg by mouth daily.    . cetirizine (ZYRTEC) 10 MG tablet Take 10 mg by mouth daily.     . cilostazol (PLETAL) 100 MG tablet TAKE 1 TABLET BY MOUTH TWICE DAILY. TAKE 30 MINUTES BEFORE OR 2 HOURS AFTER FOOD. 180 tablet 1  . diphenhydramine-acetaminophen (TYLENOL PM) 25-500 MG TABS tablet Take 2 tablets by mouth at bedtime.    Marland Kitchen lisinopril (PRINIVIL,ZESTRIL) 10 MG tablet Take 1 tablet (10 mg total) by mouth daily. 90 tablet 1  . metoprolol succinate (TOPROL-XL) 25 MG 24 hr tablet Take 1 tablet (25 mg total) by mouth daily. Take with or immediately following a meal. 90 tablet 1  . Multiple Vitamins-Minerals (MULTIVITAMIN ADULTS PO) Take 1 tablet by mouth daily. VitaFusion    . naproxen sodium (ANAPROX) 220 MG tablet Take 220 mg by mouth 2 (two) times daily as needed (for pain.).     Marland Kitchen nitroGLYCERIN (NITROSTAT) 0.4 MG SL tablet Place 1 tablet (0.4 mg total) under the tongue every 5 (five) minutes as needed for chest pain. 25 tablet 2  . omeprazole (PRILOSEC) 20 MG capsule Take 1 capsule (20 mg total) by mouth daily. 30 capsule 2  . Pitavastatin Calcium 2 MG TABS Take 1 tablet (2 mg total) by mouth daily. 90 tablet 1   No current facility-administered medications for this visit.     Allergies:   Codeine    Social History:  The patient  reports that he quit smoking about 8 months ago. His smoking use included cigarettes. He smoked 1.00 pack per day. He has never used smokeless tobacco. He reports that he drinks alcohol. He reports that he does not use drugs.    Family History:  The patient's family history includes Coronary artery disease in his father; Diabetes in his mother; Heart disease in his brother, father, sister, and sister.    ROS: All other systems are reviewed and negative. Unless otherwise mentioned in H&P    PHYSICAL EXAM: VS:  BP (!) 152/90   Pulse (!) 59   Ht 5\' 10"  (1.778 m)   Wt 233 lb 6.4 oz (105.9 kg)   BMI 33.49 kg/m  , BMI Body mass index is 33.49 kg/m. GEN: Well nourished, well developed, in no acute distress  HEENT: normal  Neck: no JVD, carotid bruits, or masses Cardiac: RRR; no murmurs, rubs, or gallops,no edema  Respiratory:  Clear to  auscultation bilaterally, normal work of breathing GI: soft, nontender, nondistended, + BS, obese MS: no deformity or atrophy  Skin: warm and dry, no rash Neuro:  Strength and sensation are intact Psych: euthymic mood, full affect   EKG: Sinus bradycardia, nonspecific T wave abnormalities noted inferior laterally.  Heart rate of 59 bpm  Recent Labs: 06/28/2017: ALT 21 06/29/2017: Platelets 187 07/08/2017: BUN 16; Creatinine, Ser 0.97; Hemoglobin 12.3; Potassium 4.9; Sodium 138    Lipid Panel    Component Value Date/Time   CHOL 185 06/29/2017 0139   CHOL 239 (H) 03/31/2017 1112   TRIG 183 (H) 06/29/2017 0139   HDL 34 (L) 06/29/2017 0139   HDL 36 (L) 03/31/2017 1112   CHOLHDL 5.4 06/29/2017 0139   VLDL 37 06/29/2017 0139   LDLCALC 114 (H) 06/29/2017 0139   LDLCALC Comment 03/31/2017 1112   LDLDIRECT 91.0 06/16/2013 0906      Wt Readings from Last 3 Encounters:  12/15/17 233 lb 6.4 oz (105.9 kg)  11/10/17 234 lb (106.1 kg)  07/23/17 225 lb (102.1 kg)      Other studies Reviewed: Echo 10/11/2006.   - There may be slight hypokinesis of the inferior wall at the base.    There is very mild septal dyssynergy that may be related to    IVCD. There are no marked focal wall abnormalities. Overall    left ventricular systolic function was normal.  Left    ventricular ejection fraction was estimated to be 60 %. - Normal aortic valve - Normal mitral valve - The left atrium was mildly dilated.   ASSESSMENT AND PLAN:  1.  Chest pain: Known history of coronary artery disease with coronary artery bypass graft n 06/2016 ((LIMA to the LAD, sequential saphenous vein graft to the intermediate and obtuse marginal, and a saphenous vein graft to the PDA).  The patient states since having had his bypass surgery on his legs he has become more active and in so doing he has had recurrent discomfort in his chest.  It is not associated with significant diaphoresis or dyspnea but he is becoming more fatigued.  I will plan a nuclear medicine stress Myoview for evaluation of progression of CAD into graft areas and need to repeat catheterization.  2.  Hypertension: Blood pressure has not been controlled recently.  He has not been taking his medication for 5 days as he ran out and did not have refills which was also the purpose of this appointment.  Due to bradycardia noted on EKG this office visit, I am going to decrease his metoprolol from 50 mg XL daily to 25 mg XL daily but increase his lisinopril dose from 5 mg daily to 10 mg daily for blood pressure control with reduction of beta-blocker.  The patient will be given 90-day supplies.  He is advised when his bottle he comes low with 7 days left on his supply he is to call us and not run out.  I will order a BMET to evaluate kidney function.  3.  Peripheral arterial disease: Followed by Dr. Kirke Corin and Dr. Imogene Burn status post 06/28/2017 with right common femoral artery to above-the-knee popliteal artery bypass with non-reversed left greater saphenous vein.  4.  Tobacco abuse: The patient has stopped smoking since recent lower extremity bypass.  He is congratulated on this lifestyle change.  5.  Hypercholesterolemia: Most recent labs revealed elevation in total cholesterol and LDL.  He is on pravastatin.  It  is been greater than 1 year  since this has been checked.  I am going to order fasting lipids and LFTs this visit.  Current medicines are reviewed at length with the patient today.    Labs/ tests ordered today include: Fasting lipids and LFTs, BMET  Bettey Mare. Liborio Nixon, ANP, AACC   12/15/2017 9:41 AM    [facility] [address], [place], [place] [address] Phone: [phone]; Fax: [phone]

## 2017-12-15 ENCOUNTER — Encounter: Payer: Self-pay | Admitting: Adult Health

## 2017-12-15 ENCOUNTER — Encounter: Payer: Self-pay | Admitting: Physician Assistant

## 2017-12-15 ENCOUNTER — Ambulatory Visit: Payer: 59 | Admitting: Adult Health

## 2017-12-15 VITALS — BP 152/90 | HR 59 | Ht 70.0 in | Wt 233.4 lb

## 2017-12-15 DIAGNOSIS — I739 Peripheral vascular disease, unspecified: Secondary | ICD-10-CM | POA: Diagnosis not present

## 2017-12-15 DIAGNOSIS — Z79899 Other long term (current) drug therapy: Secondary | ICD-10-CM | POA: Diagnosis not present

## 2017-12-15 DIAGNOSIS — I10 Essential (primary) hypertension: Secondary | ICD-10-CM

## 2017-12-15 DIAGNOSIS — I25799 Atherosclerosis of other coronary artery bypass graft(s) with unspecified angina pectoris: Secondary | ICD-10-CM | POA: Diagnosis not present

## 2017-12-15 DIAGNOSIS — Z951 Presence of aortocoronary bypass graft: Secondary | ICD-10-CM | POA: Diagnosis not present

## 2017-12-15 DIAGNOSIS — E7800 Pure hypercholesterolemia, unspecified: Secondary | ICD-10-CM | POA: Diagnosis not present

## 2017-12-15 DIAGNOSIS — R079 Chest pain, unspecified: Secondary | ICD-10-CM

## 2017-12-15 LAB — LIPID PANEL
CHOLESTEROL TOTAL: 219 mg/dL — AB (ref 100–199)
Chol/HDL Ratio: 5.3 ratio — ABNORMAL HIGH (ref 0.0–5.0)
HDL: 41 mg/dL (ref >39)
LDL CALC: 136 mg/dL — AB (ref 0–99)
TRIGLYCERIDES: 209 mg/dL — AB (ref 0–149)
VLDL Cholesterol Cal: 42 mg/dL — ABNORMAL HIGH (ref 5–40)

## 2017-12-15 LAB — HEPATIC FUNCTION PANEL
ALK PHOS: 100 IU/L (ref 39–117)
ALT: 22 IU/L (ref 0–44)
AST: 17 IU/L (ref 0–40)
Albumin: 4.5 g/dL (ref 3.6–4.8)
BILIRUBIN, DIRECT: 0.12 mg/dL (ref 0.00–0.40)
Bilirubin Total: 0.3 mg/dL (ref 0.0–1.2)
TOTAL PROTEIN: 7.2 g/dL (ref 6.0–8.5)

## 2017-12-15 LAB — BASIC METABOLIC PANEL
BUN / CREAT RATIO: 16 (ref 10–24)
BUN: 16 mg/dL (ref 8–27)
CO2: 26 mmol/L (ref 20–29)
CREATININE: 1.02 mg/dL (ref 0.76–1.27)
Calcium: 9.4 mg/dL (ref 8.6–10.2)
Chloride: 101 mmol/L (ref 96–106)
GFR calc Af Amer: 89 mL/min/{1.73_m2} (ref >59)
GFR calc non Af Amer: 77 mL/min/{1.73_m2} (ref >59)
GLUCOSE: 113 mg/dL — AB (ref 65–99)
Potassium: 4.6 mmol/L (ref 3.5–5.2)
Sodium: 140 mmol/L (ref 134–144)

## 2017-12-15 MED ORDER — NITROGLYCERIN 0.4 MG SL SUBL: 0.4000 mg | SUBLINGUAL_TABLET | SUBLINGUAL | 2 refills | Status: DC | PRN

## 2017-12-15 MED ORDER — PITAVASTATIN CALCIUM 2 MG PO TABS: 2.0000 mg | ORAL_TABLET | Freq: Every day | ORAL | 1 refills | Status: DC

## 2017-12-15 MED ORDER — LISINOPRIL 10 MG PO TABS: 10.0000 mg | ORAL_TABLET | Freq: Every day | ORAL | 1 refills | Status: DC

## 2017-12-15 MED ORDER — AMLODIPINE BESYLATE 10 MG PO TABS: 10.0000 mg | ORAL_TABLET | Freq: Every day | ORAL | 1 refills | Status: DC

## 2017-12-15 MED ORDER — CILOSTAZOL 100 MG PO TABS: ORAL_TABLET | ORAL | 1 refills | Status: DC

## 2017-12-15 MED ORDER — METOPROLOL SUCCINATE ER 25 MG PO TB24: 25.0000 mg | ORAL_TABLET | Freq: Every day | ORAL | 1 refills | Status: DC

## 2017-12-15 MED FILL — METOPROLOL SUCCINATE ER 25: 25 | 90 days supply | Qty: 90 | Fill #0

## 2017-12-15 MED FILL — LISINOPRIL 10 MG TABLET: 10 | 90 days supply | Qty: 90 | Fill #0

## 2017-12-15 MED FILL — NITROGLYCERIN 0.4 MG TAB SL: 0.4 | 10 days supply | Qty: 25 | Fill #0

## 2017-12-15 NOTE — Patient Instructions (Signed)
Medication Instructions:  DECREASE METOPROLOL 25MG  DAILY  INCREASE LISINOPRIL 10MG  DAILY  If you need a refill on your cardiac medications before your next appointment, please call your pharmacy.  Labwork: BMET,LIPID AND LFT TODAY HERE IN OUR OFFICE AT [facility]  Testing/Procedures: Your physician has requested that you have an Exercise Myoview. A cardiac stress test is a cardiological test that measures the heart's ability to respond to external stress in a controlled clinical environment. The stress response is induced by exercise (exercise-treadmill) or by intravenous pharmacological stimulation   For further information please visit [email or web address] Please follow instructions below.  How to prepare for your Myocardial Perfusion Test:   Do not eat or drink 3 hours prior to your test, except you may have water.  Do not consume products containing caffeine (regular or decaffeinated) 12 hours prior to your test. (ex: coffee, chocolate, sodas, tea).  Do wear comfortable clothes (no dresses or overalls) and walking shoes, tennis shoes preferred (No heels or open toe shoes are allowed).  Do NOT wear cologne, perfume, aftershave, or lotions (deodorant is allowed).  If these instructions are not followed, your test will have to be rescheduled.  If you have questions or concerns about your appointment, you can call the Nuclear Lab at [phone].   Special Instructions:  GET A BP CUFF (MAKE SURE IT HAS LARGE CUFF) TAKE AND LOG TWICE DAILY  Follow-Up: Your physician wants you to follow-up in: 2WEEKS WITH KATHRYN LAWRENCE (NURSE PRACTIONIER), DNP.    Thank you for choosing [facility] at [facility]!!

## 2017-12-16 ENCOUNTER — Other Ambulatory Visit: Payer: Self-pay

## 2017-12-16 DIAGNOSIS — I25118 Atherosclerotic heart disease of native coronary artery with other forms of angina pectoris: Secondary | ICD-10-CM

## 2017-12-16 DIAGNOSIS — E785 Hyperlipidemia, unspecified: Secondary | ICD-10-CM

## 2017-12-16 DIAGNOSIS — Z8679 Personal history of other diseases of the circulatory system: Secondary | ICD-10-CM | POA: Diagnosis not present

## 2017-12-16 DIAGNOSIS — R5381 Other malaise: Secondary | ICD-10-CM | POA: Diagnosis not present

## 2017-12-16 DIAGNOSIS — Z79899 Other long term (current) drug therapy: Secondary | ICD-10-CM

## 2017-12-16 DIAGNOSIS — Z9109 Other allergy status, other than to drugs and biological substances: Secondary | ICD-10-CM | POA: Diagnosis not present

## 2017-12-16 MED ORDER — ROSUVASTATIN CALCIUM 10 MG PO TABS: 10.0000 mg | ORAL_TABLET | Freq: Every day | ORAL | 3 refills | Status: DC

## 2017-12-16 MED ORDER — EZETIMIBE 10 MG PO TABS: 10.0000 mg | ORAL_TABLET | Freq: Every day | ORAL | 3 refills | Status: DC

## 2017-12-21 ENCOUNTER — Telehealth (HOSPITAL_COMMUNITY): Payer: Self-pay

## 2017-12-21 NOTE — Telephone Encounter (Signed)
Encounter complete. 

## 2017-12-23 ENCOUNTER — Ambulatory Visit (HOSPITAL_COMMUNITY)
Admission: RE | Admit: 2017-12-23 | Discharge: 2017-12-23 | Disposition: A | Discharge status: Home / Self Care | Payer: 59 | Source: Ambulatory Visit | Attending: Cardiovascular Disease | Admitting: Cardiovascular Disease

## 2017-12-23 ENCOUNTER — Encounter (HOSPITAL_COMMUNITY): Payer: Self-pay | Admitting: *Deleted

## 2017-12-23 DIAGNOSIS — J449 Chronic obstructive pulmonary disease, unspecified: Secondary | ICD-10-CM | POA: Insufficient documentation

## 2017-12-23 DIAGNOSIS — I25799 Atherosclerosis of other coronary artery bypass graft(s) with unspecified angina pectoris: Secondary | ICD-10-CM | POA: Diagnosis not present

## 2017-12-23 DIAGNOSIS — R5383 Other fatigue: Secondary | ICD-10-CM | POA: Diagnosis not present

## 2017-12-23 DIAGNOSIS — R079 Chest pain, unspecified: Secondary | ICD-10-CM | POA: Insufficient documentation

## 2017-12-23 DIAGNOSIS — Z87891 Personal history of nicotine dependence: Secondary | ICD-10-CM | POA: Insufficient documentation

## 2017-12-23 DIAGNOSIS — I739 Peripheral vascular disease, unspecified: Secondary | ICD-10-CM | POA: Insufficient documentation

## 2017-12-23 DIAGNOSIS — R9439 Abnormal result of other cardiovascular function study: Secondary | ICD-10-CM | POA: Insufficient documentation

## 2017-12-23 DIAGNOSIS — E669 Obesity, unspecified: Secondary | ICD-10-CM | POA: Insufficient documentation

## 2017-12-23 DIAGNOSIS — I10 Essential (primary) hypertension: Secondary | ICD-10-CM | POA: Diagnosis not present

## 2017-12-23 DIAGNOSIS — Z6833 Body mass index (BMI) 33.0-33.9, adult: Secondary | ICD-10-CM | POA: Diagnosis not present

## 2017-12-23 DIAGNOSIS — Z951 Presence of aortocoronary bypass graft: Secondary | ICD-10-CM | POA: Diagnosis not present

## 2017-12-23 LAB — MYOCARDIAL PERFUSION IMAGING
CHL CUP RESTING HR STRESS: 52 {beats}/min
CHL RATE OF PERCEIVED EXERTION: 19
CSEPED: 5 min
Estimated workload: 7 METS
Exercise duration (sec): 6 s
LV dias vol: 140 mL (ref 62–150)
LV sys vol: 71 mL
MPHR: 155 {beats}/min
NUC STRESS TID: 0.99
Peak HR: 131 {beats}/min
Percent HR: 84 %
SDS: 0
SRS: 0
SSS: 0

## 2017-12-23 MED ORDER — TECHNETIUM TC 99M TETROFOSMIN IV KIT
30.2000 | PACK | Freq: Once | INTRAVENOUS | Status: AC | PRN
  Administered 2017-12-23: 30.2 via INTRAVENOUS
  Filled 2017-12-23: qty 31

## 2017-12-23 MED ORDER — TECHNETIUM TC 99M TETROFOSMIN IV KIT
10.1000 | PACK | Freq: Once | INTRAVENOUS | Status: AC | PRN
  Administered 2017-12-23: 10.1 via INTRAVENOUS
  Filled 2017-12-23: qty 11

## 2017-12-23 NOTE — Progress Notes (Signed)
Dr Tresa Endo reviewed Myoview study and EKG's. Pt instructed per Dr Tresa Endo to abstain from too much physical activity until he sees Dr Lyman Bishop in follow up for his Myoview results on January 04, 2018. Ok d/c pt home. Pt asymptomatic at time of discharge.

## 2018-01-03 NOTE — Progress Notes (Addendum)
Cardiology Office Note   Date:  01/04/2018   ID:  [name], DOB [birth year], MRN [id number]  PCP:  Sherren Mocha, MD  Cardiologist: [facility]   Chief Complaint  Patient presents with  . Coronary Artery Disease  . PAD     History of Present Illness: SHARBEL SAHAGUN is a 67 y.o. 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  PAD, recently seen by Dr. Kirke Corin in the setting of bilateral calf claudication symptoms, worsening over the last year. ABI"s were abnormal revealing moderately reduced right side at 0.65, and mildly reduced on the left 0.82.   Aortogram revealed mild non-obstructive iliac disease, a long occlusion of the distal SFA. There was no significant SFA or popliteal disease with two vessel run-off. The anterior tibial artery was 80% proximally stenosed., He subsequently underwent right common femoral artery to above the knee popliteal artery bypass with non-reversed left greater saphenous vein by Dr. Imogene Burn on 06/28/2017.   Since his surgery the patient has stopped smoking. On last office visit, he had run out of some of his medications and was complaining of chest discomfort with exertion such as unloading a truck and generalized fatigue. Other history includes HTN, COPD, Hyperlipidemia.   On last office visit on 12/15/2017 a NM stress test was ordered, metoprolol was decreased to metoprolol XL 25 mg daily, and increased lisinopril dose to 10 mg daily. He was given refills on all of his cardiac medications.   12/23/2017   Nuclear stress EF: 49%.  The left ventricular ejection fraction is mildly decreased (45-54%).  Downsloping ST segment depression ST segment depression of 2 mm was noted during stress in the II, III, aVF, V4 and V5 leads.  Defect 1: There is a medium defect of moderate severity present in the basal inferior and basal inferolateral location.  Findings  consistent with ischemia in the inferolateral territory.  This is an intermediate risk study.  The patient has had to take 2- 3 doses of nitroglycerin since being seen last office visit, and is continued to take daily since having had stress test.    Past Medical History:  Diagnosis Date  . Arthritis   . Asthma    occ bronchitis  . Bronchitis   . COPD (chronic obstructive pulmonary disease) (HCC)   . Coronary artery disease    a. s/p multiple prior PCIs; b. s/p CABG 06/2006 (L-LAD, S-Int and OM, S-PDA);  c. ETT-Myoview 4/13: low risk, no ischemia, EF 53%  . GERD (gastroesophageal reflux disease)   . Hx of echocardiogram    Echo 10/2006: normal LVF  . Hyperlipidemia   . Hypertension   . Myocardial infarction (HCC)   . Peripheral vascular disease (HCC)   . Seasonal allergies   . Wears dentures   . Wears glasses     Past Surgical History:  Procedure Laterality Date  . ABDOMINAL AORTOGRAM N/A 04/28/2017   Procedure: ABDOMINAL AORTOGRAM;  Surgeon: Iran Ouch, MD;  Location: [facility] INVASIVE CV LAB;  Service: Cardiovascular;  Laterality: N/A;  . ACNE CYST REMOVAL    . CARDIAC CATHETERIZATION    . COLONOSCOPY    . CORONARY ARTERY BYPASS GRAFT  2007  . CORONARY STENT PLACEMENT     x5 prior to cabg  . FEMORAL-POPLITEAL BYPASS GRAFT Right 06/28/2017   Procedure: BYPASS GRAFT COMMON FEMORAL- ABOVE KNEE POPLITEAL ARTERY WITH LEFT GREATER SAPHENOUS  VEIN;  Surgeon: Fransisco Hertz, MD;  Location: [facility] OR;  Service: Vascular;  Laterality: Right;  . KNEE ARTHROSCOPY     rt knee  . LOWER EXTREMITY ANGIOGRAPHY Bilateral 04/28/2017   Procedure: Lower Extremity Angiography;  Surgeon: Iran Ouch, MD;  Location: [facility] INVASIVE CV LAB;  Service: Cardiovascular;  Laterality: Bilateral;  . MULTIPLE TOOTH EXTRACTIONS    . SHOULDER ARTHROSCOPY  12/14/2011   removed bone spurs and repositioned muscle.   . TONSILLECTOMY       Current Outpatient Medications  Medication Sig Dispense Refill  .  albuterol (PROVENTIL HFA;VENTOLIN HFA) 108 (90 Base) MCG/ACT inhaler Inhale 2 puffs into the lungs every 4 (four) hours as needed for wheezing or shortness of breath (cough, shortness of breath or wheezing.). 1 Inhaler 0  . amLODipine (NORVASC) 10 MG tablet Take 1 tablet (10 mg total) by mouth daily. 90 tablet 1  . aspirin EC 81 MG tablet Take 81 mg by mouth daily.    . cetirizine (ZYRTEC) 10 MG tablet Take 10 mg by mouth daily.     . cilostazol (PLETAL) 100 MG tablet TAKE 1 TABLET BY MOUTH TWICE DAILY. TAKE 30 MINUTES BEFORE OR 2 HOURS AFTER FOOD. 180 tablet 1  . diphenhydramine-acetaminophen (TYLENOL PM) 25-500 MG TABS tablet Take 2 tablets by mouth at bedtime.    Marland Kitchen ezetimibe (ZETIA) 10 MG tablet Take 1 tablet (10 mg total) by mouth daily. 30 tablet 3  . lisinopril (PRINIVIL,ZESTRIL) 10 MG tablet Take 1 tablet (10 mg total) by mouth daily. 90 tablet 1  . metoprolol succinate (TOPROL-XL) 25 MG 24 hr tablet Take 1 tablet (25 mg total) by mouth daily. Take with or immediately following a meal. 90 tablet 1  . Multiple Vitamins-Minerals (MULTIVITAMIN ADULTS PO) Take 1 tablet by mouth daily. VitaFusion    . naproxen sodium (ANAPROX) 220 MG tablet Take 220 mg by mouth 2 (two) times daily as needed (for pain.).     Marland Kitchen nitroGLYCERIN (NITROSTAT) 0.4 MG SL tablet Place 1 tablet (0.4 mg total) under the tongue every 5 (five) minutes as needed for chest pain. 25 tablet 2  . omeprazole (PRILOSEC) 20 MG capsule Take 1 capsule (20 mg total) by mouth daily. 30 capsule 2  . Pitavastatin Calcium 2 MG TABS Take 1 tablet (2 mg total) by mouth daily. 90 tablet 1  . rosuvastatin (CRESTOR) 10 MG tablet Take 1 tablet (10 mg total) by mouth daily. 30 tablet 3   No current facility-administered medications for this visit.     Allergies:   Codeine    Social History:  The patient  reports that he quit smoking about 9 months ago. His smoking use included cigarettes. He smoked 1.00 pack per day. He has never used smokeless  tobacco. He reports that he drinks alcohol. He reports that he does not use drugs.   Family History:  The patient's family history includes Coronary artery disease in his father; Diabetes in his mother; Heart disease in his brother, father, sister, and sister.    ROS: All other systems are reviewed and negative. Unless otherwise mentioned in H&P    PHYSICAL EXAM: VS:  There were no vitals taken for this visit. , BMI There is no height or weight on file to calculate BMI. GEN: Well nourished, well developed, in no acute distress  HEENT: normal  Neck: no JVD, carotid bruits, or masses Cardiac: RRR; no murmurs, rubs, or gallops,no edema  Respiratory:  clear to auscultation bilaterally, normal work  of breathing GI: soft, nontender, nondistended, + BS MS: no deformity or atrophy  Skin: warm and dry, no rash Neuro:  Strength and sensation are intact Psych: euthymic mood, full affect   EKG: Sinus bradycardia heart rate 51 bpm.  Otherwise normal  Recent Labs: 06/29/2017: Platelets 187 07/08/2017: Hemoglobin 12.3 12/15/2017: ALT 22; BUN 16; Creatinine, Ser 1.02; Potassium 4.6; Sodium 140    Lipid Panel    Component Value Date/Time   CHOL 219 (H) 12/15/2017 0929   TRIG 209 (H) 12/15/2017 0929   HDL 41 12/15/2017 0929   CHOLHDL 5.3 (H) 12/15/2017 0929   CHOLHDL 5.4 06/29/2017 0139   VLDL 37 06/29/2017 0139   LDLCALC 136 (H) 12/15/2017 0929   LDLDIRECT 91.0 06/16/2013 0906      Wt Readings from Last 3 Encounters:  12/23/17 233 lb (105.7 kg)  12/15/17 233 lb 6.4 oz (105.9 kg)  11/10/17 234 lb (106.1 kg)      Other studies Reviewed: Echo 10/11/2006.   - There may be slight hypokinesis of the inferior wall at the base.    There is very mild septal dyssynergy that may be related to    IVCD. There are no marked focal wall abnormalities. Overall    left ventricular systolic function was normal. Left    ventricular ejection fraction was estimated to be 60 %. -  Normal aortic valve - Normal mitral valve - The left atrium was mildly dilated.    ASSESSMENT AND PLAN:  1.  Coronary artery disease: The patient has had CABG x4 along with PCI of the right coronary artery and is now having recurrent chest pain.  He is requiring nitroglycerin sublingual.  Patient did have an abnormal stress Myoview which was indicative of basal inferior ischemia.  I have discussed this with Dr. [name] who is on-site.  The patient will be scheduled for cardiac catheterization with Dr. Tresa Endo on Jan 05, 2018 at 8:30 AM. The patient understands that risks include but are not limited to stroke (1 in 1000), death (1 in 1000), kidney failure [usually temporary] (1 in 500), bleeding (1 in 200), allergic reaction [possibly serious] (1 in 200), and agrees to proceed.   2.  Hypertension: Blood pressure is not well controlled.On review of his home BP's he is running high with systolic BP 160's.  Will increase lisinopril to 20 mg daily.   3. Hypercholesterolemia: Continue Crestor 10 mg daily, Last labs on 12/15/2017 TC 219, LDL 136, TG 209. He will need to have increased dose of Crestor or change to high dose Lipitor after cardiac cath. May need addition of Zetia.   4. PAD: Hx of Right Fem-Pop.   Current medicines are reviewed at length with the patient today.    Labs/ tests ordered today include:  Cardiac cath with pre-cath labs.  Bettey Mare. Liborio Nixon, ANP, AACC   01/04/2018 8:21 AM    [facility] [address] [place], [place] [address] Phone: [phone]; Fax: [phone]

## 2018-01-03 NOTE — H&P (View-Only) (Signed)
Cardiology Office Note   Date:  01/04/2018   ID:  [name], DOB [birth year], MRN [id number]  PCP:  Sherren Mocha, MD  Cardiologist: [facility]   Chief Complaint  Patient presents with  . Coronary Artery Disease  . PAD     History of Present Illness: [name] is a 67 y.o. 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  PAD, recently seen by Dr. Kirke Corin in the setting of bilateral calf claudication symptoms, worsening over the last year. ABI"s were abnormal revealing moderately reduced right side at 0.65, and mildly reduced on the left 0.82.   Aortogram revealed mild non-obstructive iliac disease, a long occlusion of the distal SFA. There was no significant SFA or popliteal disease with two vessel run-off. The anterior tibial artery was 80% proximally stenosed., He subsequently underwent right common femoral artery to above the knee popliteal artery bypass with non-reversed left greater saphenous vein by Dr. Imogene Burn on 06/28/2017.   Since his surgery the patient has stopped smoking. On last office visit, he had run out of some of his medications and was complaining of chest discomfort with exertion such as unloading a truck and generalized fatigue. Other history includes HTN, COPD, Hyperlipidemia.   On last office visit on 12/15/2017 a NM stress test was ordered, metoprolol was decreased to metoprolol XL 25 mg daily, and increased lisinopril dose to 10 mg daily. He was given refills on all of his cardiac medications.   12/23/2017   Nuclear stress EF: 49%.  The left ventricular ejection fraction is mildly decreased (45-54%).  Downsloping ST segment depression ST segment depression of 2 mm was noted during stress in the II, III, aVF, V4 and V5 leads.  Defect 1: There is a medium defect of moderate severity present in the basal inferior and basal inferolateral location.  Findings  consistent with ischemia in the inferolateral territory.  This is an intermediate risk study.  The patient has had to take 2- 3 doses of nitroglycerin since being seen last office visit, and is continued to take daily since having had stress test.    Past Medical History:  Diagnosis Date  . Arthritis   . Asthma    occ bronchitis  . Bronchitis   . COPD (chronic obstructive pulmonary disease) (HCC)   . Coronary artery disease    a. s/p multiple prior PCIs; b. s/p CABG 06/2006 (L-LAD, S-Int and OM, S-PDA);  c. ETT-Myoview 4/13: low risk, no ischemia, EF 53%  . GERD (gastroesophageal reflux disease)   . Hx of echocardiogram    Echo 10/2006: normal LVF  . Hyperlipidemia   . Hypertension   . Myocardial infarction (HCC)   . Peripheral vascular disease (HCC)   . Seasonal allergies   . Wears dentures   . Wears glasses     Past Surgical History:  Procedure Laterality Date  . ABDOMINAL AORTOGRAM N/A 04/28/2017   Procedure: ABDOMINAL AORTOGRAM;  Surgeon: Iran Ouch, MD;  Location: [facility] INVASIVE CV LAB;  Service: Cardiovascular;  Laterality: N/A;  . ACNE CYST REMOVAL    . CARDIAC CATHETERIZATION    . COLONOSCOPY    . CORONARY ARTERY BYPASS GRAFT  2007  . CORONARY STENT PLACEMENT     x5 prior to cabg  . FEMORAL-POPLITEAL BYPASS GRAFT Right 06/28/2017   Procedure: BYPASS GRAFT COMMON FEMORAL- ABOVE KNEE POPLITEAL ARTERY WITH LEFT GREATER SAPHENOUS  VEIN;  Surgeon: Fransisco Hertz, MD;  Location: [facility] OR;  Service: Vascular;  Laterality: Right;  . KNEE ARTHROSCOPY     rt knee  . LOWER EXTREMITY ANGIOGRAPHY Bilateral 04/28/2017   Procedure: Lower Extremity Angiography;  Surgeon: Iran Ouch, MD;  Location: [facility] INVASIVE CV LAB;  Service: Cardiovascular;  Laterality: Bilateral;  . MULTIPLE TOOTH EXTRACTIONS    . SHOULDER ARTHROSCOPY  12/14/2011   removed bone spurs and repositioned muscle.   . TONSILLECTOMY       Current Outpatient Medications  Medication Sig Dispense Refill  .  albuterol (PROVENTIL HFA;VENTOLIN HFA) 108 (90 Base) MCG/ACT inhaler Inhale 2 puffs into the lungs every 4 (four) hours as needed for wheezing or shortness of breath (cough, shortness of breath or wheezing.). 1 Inhaler 0  . amLODipine (NORVASC) 10 MG tablet Take 1 tablet (10 mg total) by mouth daily. 90 tablet 1  . aspirin EC 81 MG tablet Take 81 mg by mouth daily.    . cetirizine (ZYRTEC) 10 MG tablet Take 10 mg by mouth daily.     . cilostazol (PLETAL) 100 MG tablet TAKE 1 TABLET BY MOUTH TWICE DAILY. TAKE 30 MINUTES BEFORE OR 2 HOURS AFTER FOOD. 180 tablet 1  . diphenhydramine-acetaminophen (TYLENOL PM) 25-500 MG TABS tablet Take 2 tablets by mouth at bedtime.    Marland Kitchen ezetimibe (ZETIA) 10 MG tablet Take 1 tablet (10 mg total) by mouth daily. 30 tablet 3  . lisinopril (PRINIVIL,ZESTRIL) 10 MG tablet Take 1 tablet (10 mg total) by mouth daily. 90 tablet 1  . metoprolol succinate (TOPROL-XL) 25 MG 24 hr tablet Take 1 tablet (25 mg total) by mouth daily. Take with or immediately following a meal. 90 tablet 1  . Multiple Vitamins-Minerals (MULTIVITAMIN ADULTS PO) Take 1 tablet by mouth daily. VitaFusion    . naproxen sodium (ANAPROX) 220 MG tablet Take 220 mg by mouth 2 (two) times daily as needed (for pain.).     Marland Kitchen nitroGLYCERIN (NITROSTAT) 0.4 MG SL tablet Place 1 tablet (0.4 mg total) under the tongue every 5 (five) minutes as needed for chest pain. 25 tablet 2  . omeprazole (PRILOSEC) 20 MG capsule Take 1 capsule (20 mg total) by mouth daily. 30 capsule 2  . Pitavastatin Calcium 2 MG TABS Take 1 tablet (2 mg total) by mouth daily. 90 tablet 1  . rosuvastatin (CRESTOR) 10 MG tablet Take 1 tablet (10 mg total) by mouth daily. 30 tablet 3   No current facility-administered medications for this visit.     Allergies:   Codeine    Social History:  The patient  reports that he quit smoking about 9 months ago. His smoking use included cigarettes. He smoked 1.00 pack per day. He has never used smokeless  tobacco. He reports that he drinks alcohol. He reports that he does not use drugs.   Family History:  The patient's family history includes Coronary artery disease in his father; Diabetes in his mother; Heart disease in his brother, father, sister, and sister.    ROS: All other systems are reviewed and negative. Unless otherwise mentioned in H&P    PHYSICAL EXAM: VS:  There were no vitals taken for this visit. , BMI There is no height or weight on file to calculate BMI. GEN: Well nourished, well developed, in no acute distress  HEENT: normal  Neck: no JVD, carotid bruits, or masses Cardiac: RRR; no murmurs, rubs, or gallops,no edema  Respiratory:  clear to auscultation bilaterally, normal work  of breathing GI: soft, nontender, nondistended, + BS MS: no deformity or atrophy  Skin: warm and dry, no rash Neuro:  Strength and sensation are intact Psych: euthymic mood, full affect   EKG: Sinus bradycardia heart rate 51 bpm.  Otherwise normal  Recent Labs: 06/29/2017: Platelets 187 07/08/2017: Hemoglobin 12.3 12/15/2017: ALT 22; BUN 16; Creatinine, Ser 1.02; Potassium 4.6; Sodium 140    Lipid Panel    Component Value Date/Time   CHOL 219 (H) 12/15/2017 0929   TRIG 209 (H) 12/15/2017 0929   HDL 41 12/15/2017 0929   CHOLHDL 5.3 (H) 12/15/2017 0929   CHOLHDL 5.4 06/29/2017 0139   VLDL 37 06/29/2017 0139   LDLCALC 136 (H) 12/15/2017 0929   LDLDIRECT 91.0 06/16/2013 0906      Wt Readings from Last 3 Encounters:  12/23/17 233 lb (105.7 kg)  12/15/17 233 lb 6.4 oz (105.9 kg)  11/10/17 234 lb (106.1 kg)      Other studies Reviewed: Echo 10/11/2006.   - There may be slight hypokinesis of the inferior wall at the base.    There is very mild septal dyssynergy that may be related to    IVCD. There are no marked focal wall abnormalities. Overall    left ventricular systolic function was normal. Left    ventricular ejection fraction was estimated to be 60 %. -  Normal aortic valve - Normal mitral valve - The left atrium was mildly dilated.    ASSESSMENT AND PLAN:  1.  Coronary artery disease: The patient has had CABG x4 along with PCI of the right coronary artery and is now having recurrent chest pain.  He is requiring nitroglycerin sublingual.  Patient did have an abnormal stress Myoview which was indicative of basal inferior ischemia.  I have discussed this with Dr. [name] who is on-site.  The patient will be scheduled for cardiac catheterization with Dr. Tresa Endo on Jan 05, 2018 at 8:30 AM. The patient understands that risks include but are not limited to stroke (1 in 1000), death (1 in 1000), kidney failure [usually temporary] (1 in 500), bleeding (1 in 200), allergic reaction [possibly serious] (1 in 200), and agrees to proceed.   2.  Hypertension: Blood pressure is not well controlled.On review of his home BP's he is running high with systolic BP 160's.  Will increase lisinopril to 20 mg daily.   3. Hypercholesterolemia: Continue Crestor 10 mg daily, Last labs on 12/15/2017 TC 219, LDL 136, TG 209. He will need to have increased dose of Crestor or change to high dose Lipitor after cardiac cath. May need addition of Zetia.   4. PAD: Hx of Right Fem-Pop.   Current medicines are reviewed at length with the patient today.    Labs/ tests ordered today include:  Cardiac cath with pre-cath labs.  Bettey Mare. Liborio Nixon, ANP, AACC   01/04/2018 8:21 AM    [facility] [address] [place], [place] [address] Phone: [phone]; Fax: [phone]

## 2018-01-04 ENCOUNTER — Encounter: Payer: Self-pay | Admitting: Adult Health

## 2018-01-04 ENCOUNTER — Ambulatory Visit: Payer: 59 | Admitting: Adult Health

## 2018-01-04 VITALS — BP 162/76 | HR 58 | Ht 70.0 in | Wt 237.2 lb

## 2018-01-04 DIAGNOSIS — I10 Essential (primary) hypertension: Secondary | ICD-10-CM

## 2018-01-04 DIAGNOSIS — Z79899 Other long term (current) drug therapy: Secondary | ICD-10-CM

## 2018-01-04 DIAGNOSIS — I2510 Atherosclerotic heart disease of native coronary artery without angina pectoris: Secondary | ICD-10-CM | POA: Diagnosis not present

## 2018-01-04 DIAGNOSIS — R9431 Abnormal electrocardiogram [ECG] [EKG]: Secondary | ICD-10-CM

## 2018-01-04 DIAGNOSIS — I739 Peripheral vascular disease, unspecified: Secondary | ICD-10-CM | POA: Diagnosis not present

## 2018-01-04 DIAGNOSIS — R9439 Abnormal result of other cardiovascular function study: Secondary | ICD-10-CM

## 2018-01-04 MED ORDER — LISINOPRIL 20 MG PO TABS: 20.0000 mg | ORAL_TABLET | Freq: Every day | ORAL | 3 refills | Status: DC

## 2018-01-04 NOTE — Patient Instructions (Signed)
   [facility] CARDIOVASCULAR DIVISION [facility] [address] [place] [address] Dept: [phone] Loc: [phone]  [name]  01/04/2018  You are scheduled for a Cardiac Catheterization on Wednesday, May 1 with Dale. Nicki Guadalajara.  1. Please arrive at the [facility] (Main Entrance A) at [facility]: [address] [place], [place] [address] at 6:30 AM (two hours before your procedure to ensure your preparation). Free valet parking service is available.   Special note: Every effort is made to have your procedure done on time. Please understand that emergencies sometimes delay scheduled procedures.  2. Diet: Do not eat or drink anything after midnight prior to your procedure except sips of water to take medications.  3. Labs: You will need to have blood drawn on Tuesday, April 30 at [facility] [address], [place]  Open: 8am - 5pm (Lunch 12:45 - 1:45)   Phone: [phone]. You do not need to be fasting.  4. Medication instructions in preparation for your procedure:  5. Schedule 2 week f/u appointment with [name] -OR- [name] (NURSE PRACTIONIER), DNP,AACC IF PRIMARY CARDIOLOGIST IS UNAVAILABLE.   On the morning of your procedure, take your Aspirin and any morning medicines NOT listed above.  You may use sips of water.  5. Plan for one night stay--bring personal belongings. 6. Bring a current list of your medications and current insurance cards. 7. You MUST have a responsible person to drive you home. 8. Someone MUST be with you the first 24 hours after you arrive home or your discharge will be delayed. 9. Please wear clothes that are easy to get on and off and wear slip-on shoes.  Thank you for allowing [place] to care for you!   -- [facility]

## 2018-01-05 ENCOUNTER — Encounter (HOSPITAL_COMMUNITY): Payer: Self-pay | Admitting: General Practice

## 2018-01-05 ENCOUNTER — Ambulatory Visit (HOSPITAL_COMMUNITY)
Admission: RE | Admit: 2018-01-05 | Discharge: 2018-01-06 | Disposition: A | Discharge status: Home / Self Care | Payer: 59 | Source: Ambulatory Visit | Attending: Cardiovascular Disease | Admitting: Cardiovascular Disease

## 2018-01-05 ENCOUNTER — Other Ambulatory Visit: Payer: Self-pay

## 2018-01-05 ENCOUNTER — Encounter (HOSPITAL_COMMUNITY)
Admission: RE | Disposition: A | Discharge status: Home / Self Care | Payer: Self-pay | Source: Ambulatory Visit | Attending: Cardiovascular Disease

## 2018-01-05 DIAGNOSIS — Z8249 Family history of ischemic heart disease and other diseases of the circulatory system: Secondary | ICD-10-CM | POA: Insufficient documentation

## 2018-01-05 DIAGNOSIS — I25110 Atherosclerotic heart disease of native coronary artery with unstable angina pectoris: Secondary | ICD-10-CM | POA: Insufficient documentation

## 2018-01-05 DIAGNOSIS — I200 Unstable angina: Secondary | ICD-10-CM | POA: Diagnosis present

## 2018-01-05 DIAGNOSIS — Z951 Presence of aortocoronary bypass graft: Secondary | ICD-10-CM | POA: Insufficient documentation

## 2018-01-05 DIAGNOSIS — Z7902 Long term (current) use of antithrombotics/antiplatelets: Secondary | ICD-10-CM | POA: Insufficient documentation

## 2018-01-05 DIAGNOSIS — I252 Old myocardial infarction: Secondary | ICD-10-CM | POA: Insufficient documentation

## 2018-01-05 DIAGNOSIS — Z9861 Coronary angioplasty status: Secondary | ICD-10-CM

## 2018-01-05 DIAGNOSIS — E785 Hyperlipidemia, unspecified: Secondary | ICD-10-CM | POA: Insufficient documentation

## 2018-01-05 DIAGNOSIS — I70213 Atherosclerosis of native arteries of extremities with intermittent claudication, bilateral legs: Secondary | ICD-10-CM | POA: Diagnosis present

## 2018-01-05 DIAGNOSIS — I2582 Chronic total occlusion of coronary artery: Secondary | ICD-10-CM | POA: Insufficient documentation

## 2018-01-05 DIAGNOSIS — I739 Peripheral vascular disease, unspecified: Secondary | ICD-10-CM | POA: Diagnosis not present

## 2018-01-05 DIAGNOSIS — Z885 Allergy status to narcotic agent status: Secondary | ICD-10-CM | POA: Insufficient documentation

## 2018-01-05 DIAGNOSIS — Z955 Presence of coronary angioplasty implant and graft: Secondary | ICD-10-CM | POA: Diagnosis not present

## 2018-01-05 DIAGNOSIS — Z7982 Long term (current) use of aspirin: Secondary | ICD-10-CM | POA: Diagnosis not present

## 2018-01-05 DIAGNOSIS — Z95820 Peripheral vascular angioplasty status with implants and grafts: Secondary | ICD-10-CM | POA: Diagnosis not present

## 2018-01-05 DIAGNOSIS — Z87891 Personal history of nicotine dependence: Secondary | ICD-10-CM | POA: Diagnosis not present

## 2018-01-05 DIAGNOSIS — E7800 Pure hypercholesterolemia, unspecified: Secondary | ICD-10-CM | POA: Diagnosis not present

## 2018-01-05 DIAGNOSIS — I25710 Atherosclerosis of autologous vein coronary artery bypass graft(s) with unstable angina pectoris: Secondary | ICD-10-CM | POA: Insufficient documentation

## 2018-01-05 DIAGNOSIS — I10 Essential (primary) hypertension: Secondary | ICD-10-CM | POA: Diagnosis not present

## 2018-01-05 DIAGNOSIS — R7303 Prediabetes: Secondary | ICD-10-CM | POA: Diagnosis present

## 2018-01-05 DIAGNOSIS — K219 Gastro-esophageal reflux disease without esophagitis: Secondary | ICD-10-CM | POA: Insufficient documentation

## 2018-01-05 DIAGNOSIS — J449 Chronic obstructive pulmonary disease, unspecified: Secondary | ICD-10-CM | POA: Diagnosis not present

## 2018-01-05 DIAGNOSIS — Z79899 Other long term (current) drug therapy: Secondary | ICD-10-CM | POA: Insufficient documentation

## 2018-01-05 DIAGNOSIS — R9439 Abnormal result of other cardiovascular function study: Secondary | ICD-10-CM

## 2018-01-05 DIAGNOSIS — I2510 Atherosclerotic heart disease of native coronary artery without angina pectoris: Secondary | ICD-10-CM

## 2018-01-05 HISTORY — PX: LEFT HEART CATH AND CORS/GRAFTS ANGIOGRAPHY: CATH118250

## 2018-01-05 HISTORY — PX: CORONARY STENT INTERVENTION: CATH118234

## 2018-01-05 LAB — BASIC METABOLIC PANEL
ANION GAP: 7 (ref 5–15)
BUN: 17 mg/dL (ref 6–20)
CHLORIDE: 108 mmol/L (ref 101–111)
CO2: 25 mmol/L (ref 22–32)
CREATININE: 0.99 mg/dL (ref 0.61–1.24)
Calcium: 8.8 mg/dL — ABNORMAL LOW (ref 8.9–10.3)
GFR calc non Af Amer: >60 mL/min (ref >60)
Glucose, Bld: 114 mg/dL — ABNORMAL HIGH (ref 65–99)
POTASSIUM: 3.9 mmol/L (ref 3.5–5.1)
SODIUM: 140 mmol/L (ref 135–145)

## 2018-01-05 LAB — CBC
Hematocrit: 39.4 % (ref 37.5–51.0)
Hemoglobin: 13.4 g/dL (ref 13.0–17.7)
MCH: 29.6 pg (ref 26.6–33.0)
MCHC: 34 g/dL (ref 31.5–35.7)
MCV: 87 fL (ref 79–97)
PLATELETS: 208 10*3/uL (ref 150–379)
RBC: 4.53 x10E6/uL (ref 4.14–5.80)
RDW: 14.6 % (ref 12.3–15.4)
WBC: 6.5 10*3/uL (ref 3.4–10.8)

## 2018-01-05 LAB — POCT ACTIVATED CLOTTING TIME: Activated Clotting Time: 378 seconds

## 2018-01-05 LAB — PROTIME-INR
INR: 1 (ref 0.8–1.2)
Prothrombin Time: 10.2 s (ref 9.1–12.0)

## 2018-01-05 SURGERY — LEFT HEART CATH AND CORS/GRAFTS ANGIOGRAPHY
Anesthesia: LOCAL

## 2018-01-05 MED ORDER — IOPAMIDOL (ISOVUE-370) INJECTION 76%
INTRAVENOUS | Status: AC
  Filled 2018-01-05: qty 100

## 2018-01-05 MED ORDER — MIDAZOLAM HCL 2 MG/2ML IJ SOLN
INTRAMUSCULAR | Status: AC
  Filled 2018-01-05: qty 2

## 2018-01-05 MED ORDER — HEPARIN (PORCINE) IN NACL 2-0.9 UNITS/ML
INTRAMUSCULAR | Status: AC | PRN
  Administered 2018-01-05 (×3): 500 mL via INTRA_ARTERIAL

## 2018-01-05 MED ORDER — FENTANYL CITRATE (PF) 100 MCG/2ML IJ SOLN
INTRAMUSCULAR | Status: AC
  Filled 2018-01-05: qty 2

## 2018-01-05 MED ORDER — NITROGLYCERIN 1 MG/10 ML FOR IR/CATH LAB
INTRA_ARTERIAL | Status: AC
  Filled 2018-01-05: qty 10

## 2018-01-05 MED ORDER — SODIUM CHLORIDE 0.9% FLUSH: 3.0000 mL | INTRAVENOUS | Status: DC | PRN

## 2018-01-05 MED ORDER — HEPARIN (PORCINE) IN NACL 1000-0.9 UT/500ML-% IV SOLN
INTRAVENOUS | Status: AC
  Filled 2018-01-05: qty 1000

## 2018-01-05 MED ORDER — ONDANSETRON HCL 4 MG/2ML IJ SOLN: 4.0000 mg | Freq: Four times a day (QID) | INTRAMUSCULAR | Status: DC | PRN

## 2018-01-05 MED ORDER — FENTANYL CITRATE (PF) 100 MCG/2ML IJ SOLN
INTRAMUSCULAR | Status: DC | PRN
  Administered 2018-01-05 (×4): 25 ug via INTRAVENOUS
  Administered 2018-01-05: 50 ug via INTRAVENOUS
  Administered 2018-01-05: 25 ug via INTRAVENOUS

## 2018-01-05 MED ORDER — TICAGRELOR 90 MG PO TABS
90.0000 mg | ORAL_TABLET | Freq: Two times a day (BID) | ORAL | Status: DC
  Administered 2018-01-05 – 2018-01-06 (×2): 90 mg via ORAL
  Filled 2018-01-05 (×2): qty 1

## 2018-01-05 MED ORDER — LABETALOL HCL 5 MG/ML IV SOLN
10.0000 mg | INTRAVENOUS | Status: AC | PRN
  Filled 2018-01-05: qty 4

## 2018-01-05 MED ORDER — TICAGRELOR 90 MG PO TABS
ORAL_TABLET | ORAL | Status: DC | PRN
  Administered 2018-01-05: 180 mg via ORAL

## 2018-01-05 MED ORDER — IOPAMIDOL (ISOVUE-370) INJECTION 76%
INTRAVENOUS | Status: AC
  Filled 2018-01-05: qty 50

## 2018-01-05 MED ORDER — SODIUM CHLORIDE 0.9 % WEIGHT BASED INFUSION: 1.0000 mL/kg/h | INTRAVENOUS | Status: DC

## 2018-01-05 MED ORDER — ANGIOPLASTY BOOK
Freq: Once | Status: AC
  Administered 2018-01-05: 22:00:00
  Filled 2018-01-05: qty 1

## 2018-01-05 MED ORDER — ASPIRIN 81 MG PO CHEW: 81.0000 mg | CHEWABLE_TABLET | ORAL | Status: DC

## 2018-01-05 MED ORDER — SODIUM CHLORIDE 0.9 % IV SOLN: 250.0000 mL | INTRAVENOUS | Status: DC | PRN

## 2018-01-05 MED ORDER — ASPIRIN 81 MG PO CHEW
81.0000 mg | CHEWABLE_TABLET | Freq: Every day | ORAL | Status: DC
  Administered 2018-01-06: 11:00:00 81 mg via ORAL
  Filled 2018-01-05: qty 1

## 2018-01-05 MED ORDER — SODIUM CHLORIDE 0.9% FLUSH: 3.0000 mL | Freq: Two times a day (BID) | INTRAVENOUS | Status: DC

## 2018-01-05 MED ORDER — SODIUM CHLORIDE 0.9% FLUSH
3.0000 mL | Freq: Two times a day (BID) | INTRAVENOUS | Status: DC
  Administered 2018-01-05: 22:00:00 3 mL via INTRAVENOUS

## 2018-01-05 MED ORDER — BIVALIRUDIN TRIFLUOROACETATE 250 MG IV SOLR
INTRAVENOUS | Status: AC
  Filled 2018-01-05: qty 250

## 2018-01-05 MED ORDER — LIDOCAINE HCL (PF) 1 % IJ SOLN
INTRAMUSCULAR | Status: AC
  Filled 2018-01-05: qty 30

## 2018-01-05 MED ORDER — HEPARIN (PORCINE) IN NACL 1000-0.9 UT/500ML-% IV SOLN
INTRAVENOUS | Status: AC
  Filled 2018-01-05: qty 500

## 2018-01-05 MED ORDER — SODIUM CHLORIDE 0.9 % IV SOLN
INTRAVENOUS | Status: DC
  Administered 2018-01-05: 11:00:00 via INTRAVENOUS

## 2018-01-05 MED ORDER — DIAZEPAM 5 MG PO TABS
5.0000 mg | ORAL_TABLET | Freq: Four times a day (QID) | ORAL | Status: DC | PRN
  Administered 2018-01-05: 12:00:00 5 mg via ORAL
  Filled 2018-01-05: qty 1

## 2018-01-05 MED ORDER — HYDRALAZINE HCL 20 MG/ML IJ SOLN
5.0000 mg | INTRAMUSCULAR | Status: AC | PRN
  Administered 2018-01-05 (×3): 5 mg via INTRAVENOUS
  Filled 2018-01-05 (×4): qty 1

## 2018-01-05 MED ORDER — MORPHINE SULFATE (PF) 2 MG/ML IV SOLN
2.0000 mg | INTRAVENOUS | Status: DC | PRN
  Administered 2018-01-05: 15:00:00 2 mg via INTRAVENOUS
  Filled 2018-01-05: qty 1

## 2018-01-05 MED ORDER — BIVALIRUDIN BOLUS VIA INFUSION - CUPID
INTRAVENOUS | Status: DC | PRN
  Administered 2018-01-05: 79.95 mg via INTRAVENOUS

## 2018-01-05 MED ORDER — IOPAMIDOL (ISOVUE-370) INJECTION 76%
INTRAVENOUS | Status: DC | PRN
  Administered 2018-01-05: 270 mL via INTRA_ARTERIAL

## 2018-01-05 MED ORDER — LIDOCAINE HCL (PF) 1 % IJ SOLN
INTRAMUSCULAR | Status: DC | PRN
  Administered 2018-01-05: 15 mL via INTRADERMAL

## 2018-01-05 MED ORDER — IOPAMIDOL (ISOVUE-370) INJECTION 76%
INTRAVENOUS | Status: AC
  Filled 2018-01-05: qty 125

## 2018-01-05 MED ORDER — NITROGLYCERIN 1 MG/10 ML FOR IR/CATH LAB
INTRA_ARTERIAL | Status: DC | PRN
  Administered 2018-01-05: 200 ug via INTRACORONARY

## 2018-01-05 MED ORDER — SODIUM CHLORIDE 0.9 % IV SOLN
INTRAVENOUS | Status: AC | PRN
  Administered 2018-01-05 (×2): 1.75 mg/kg/h via INTRAVENOUS

## 2018-01-05 MED ORDER — ATORVASTATIN CALCIUM 80 MG PO TABS
80.0000 mg | ORAL_TABLET | Freq: Every day | ORAL | Status: DC
  Administered 2018-01-05: 80 mg via ORAL
  Filled 2018-01-05: qty 1

## 2018-01-05 MED ORDER — SODIUM CHLORIDE 0.9 % WEIGHT BASED INFUSION
3.0000 mL/kg/h | INTRAVENOUS | Status: DC
  Administered 2018-01-05: 3 mL/kg/h via INTRAVENOUS

## 2018-01-05 MED ORDER — ACETAMINOPHEN 325 MG PO TABS
650.0000 mg | ORAL_TABLET | ORAL | Status: DC | PRN
  Administered 2018-01-05: 650 mg via ORAL
  Filled 2018-01-05: qty 2

## 2018-01-05 MED ORDER — SODIUM CHLORIDE 0.9 % IV SOLN: 1.7500 mg/kg/h | INTRAVENOUS | Status: AC

## 2018-01-05 MED ORDER — MIDAZOLAM HCL 2 MG/2ML IJ SOLN
INTRAMUSCULAR | Status: DC | PRN
  Administered 2018-01-05 (×2): 1 mg via INTRAVENOUS
  Administered 2018-01-05: 2 mg via INTRAVENOUS
  Administered 2018-01-05 (×2): 1 mg via INTRAVENOUS

## 2018-01-05 MED ORDER — TICAGRELOR 90 MG PO TABS
ORAL_TABLET | ORAL | Status: AC
  Filled 2018-01-05: qty 2

## 2018-01-05 SURGICAL SUPPLY — 26 items
BALLN SAPPHIRE 2.5X12 (BALLOONS) ×2
BALLN ~~LOC~~ EMERGE MR 4.5X12 (BALLOONS) ×2
BALLOON SAPPHIRE 2.5X12 (BALLOONS) ×1 IMPLANT
BALLOON ~~LOC~~ EMERGE MR 4.5X12 (BALLOONS) ×1 IMPLANT
CATH INFINITI 5 FR AL2 (CATHETERS) ×2 IMPLANT
CATH INFINITI 5 FR IM (CATHETERS) ×2 IMPLANT
CATH INFINITI 5 FR JR5 (CATHETERS) ×2 IMPLANT
CATH INFINITI 5 FR LCB (CATHETERS) ×2 IMPLANT
CATH INFINITI 5 FR MPA2 (CATHETERS) ×2 IMPLANT
CATH INFINITI 5 FR RCB (CATHETERS) ×2 IMPLANT
CATH INFINITI 5FR MULTPACK ANG (CATHETERS) ×2 IMPLANT
CATH VISTA GUIDE 6FR AL2 (CATHETERS) ×2 IMPLANT
CATHETER LAUNCHER 6FR LCB (CATHETERS) ×2 IMPLANT
FILTERWIRE EZ 3.5-5.5 190CM (FILTER) ×2 IMPLANT
KIT ENCORE 26 ADVANTAGE (KITS) ×2 IMPLANT
KIT HEART LEFT (KITS) ×2 IMPLANT
PACK CARDIAC CATHETERIZATION (CUSTOM PROCEDURE TRAY) ×2 IMPLANT
SHEATH AVANTI 11CM 5FR (SHEATH) ×2 IMPLANT
SHEATH PINNACLE 6F 10CM (SHEATH) ×2 IMPLANT
STENT RESOLUTE ONYX 4.0X15 (Permanent Stent) ×2 IMPLANT
SYR MEDRAD MARK V 150ML (SYRINGE) ×2 IMPLANT
TRANSDUCER W/STOPCOCK (MISCELLANEOUS) ×2 IMPLANT
TUBING CIL FLEX 10 FLL-RA (TUBING) ×2 IMPLANT
WIRE EMERALD 3MM-J .035X150CM (WIRE) ×2 IMPLANT
WIRE EMERALD 3MM-J .035X260CM (WIRE) ×2 IMPLANT
WIRE PT2 MS 185 (WIRE) ×2 IMPLANT

## 2018-01-05 NOTE — Progress Notes (Signed)
Per Lupita Leash at [facility]  With coupon- Brilinta will be $18 per month

## 2018-01-05 NOTE — Interval H&P Note (Signed)
Cath Lab Visit (complete for each Cath Lab visit)  Clinical Evaluation Leading to the Procedure:   ACS: No.  Non-ACS:    Anginal Classification: CCS III  Anti-ischemic medical therapy: Maximal Therapy (2 or more classes of medications)  Non-Invasive Test Results: Intermediate-risk stress test findings: cardiac mortality 1-3%/year  Prior CABG: Previous CABG      History and Physical Interval Note:  01/05/2018 8:23 AM  [name]  has presented today for surgery, with the diagnosis of abnormal stress test  The various methods of treatment have been discussed with the patient and family. After consideration of risks, benefits and other options for treatment, the patient has consented to  Procedure(s): LEFT HEART CATH AND CORS/GRAFTS ANGIOGRAPHY (N/A) as a surgical intervention .  The patient's history has been reviewed, patient examined, no change in status, stable for surgery.  I have reviewed the patient's chart and labs.  Questions were answered to the patient's satisfaction.     Nicki Guadalajara

## 2018-01-05 NOTE — Progress Notes (Signed)
TR BAND REMOVAL  LOCATION:  left radial  DEFLATED PER PROTOCOL:  Yes.    TIME BAND OFF / DRESSING APPLIED:   1505   SITE UPON ARRIVAL:   Level 0  SITE AFTER BAND REMOVAL:  Level 0  CIRCULATION SENSATION AND MOVEMENT:  Within Normal Limits  Yes.    COMMENTS:

## 2018-01-06 ENCOUNTER — Encounter (HOSPITAL_COMMUNITY): Payer: Self-pay | Admitting: Cardiovascular Disease

## 2018-01-06 DIAGNOSIS — I10 Essential (primary) hypertension: Secondary | ICD-10-CM | POA: Diagnosis not present

## 2018-01-06 DIAGNOSIS — I739 Peripheral vascular disease, unspecified: Secondary | ICD-10-CM | POA: Diagnosis not present

## 2018-01-06 DIAGNOSIS — R9439 Abnormal result of other cardiovascular function study: Secondary | ICD-10-CM

## 2018-01-06 DIAGNOSIS — E7800 Pure hypercholesterolemia, unspecified: Secondary | ICD-10-CM | POA: Diagnosis not present

## 2018-01-06 DIAGNOSIS — E782 Mixed hyperlipidemia: Secondary | ICD-10-CM

## 2018-01-06 DIAGNOSIS — I2582 Chronic total occlusion of coronary artery: Secondary | ICD-10-CM | POA: Diagnosis not present

## 2018-01-06 DIAGNOSIS — I25710 Atherosclerosis of autologous vein coronary artery bypass graft(s) with unstable angina pectoris: Secondary | ICD-10-CM | POA: Diagnosis not present

## 2018-01-06 DIAGNOSIS — J449 Chronic obstructive pulmonary disease, unspecified: Secondary | ICD-10-CM | POA: Diagnosis not present

## 2018-01-06 DIAGNOSIS — E785 Hyperlipidemia, unspecified: Secondary | ICD-10-CM | POA: Diagnosis not present

## 2018-01-06 DIAGNOSIS — I200 Unstable angina: Secondary | ICD-10-CM | POA: Diagnosis not present

## 2018-01-06 DIAGNOSIS — I25110 Atherosclerotic heart disease of native coronary artery with unstable angina pectoris: Secondary | ICD-10-CM | POA: Diagnosis not present

## 2018-01-06 DIAGNOSIS — K219 Gastro-esophageal reflux disease without esophagitis: Secondary | ICD-10-CM | POA: Diagnosis not present

## 2018-01-06 LAB — CBC
HEMATOCRIT: 36.7 % — AB (ref 39.0–52.0)
Hemoglobin: 12.2 g/dL — ABNORMAL LOW (ref 13.0–17.0)
MCH: 29.3 pg (ref 26.0–34.0)
MCHC: 33.2 g/dL (ref 30.0–36.0)
MCV: 88.2 fL (ref 78.0–100.0)
Platelets: 171 10*3/uL (ref 150–400)
RBC: 4.16 MIL/uL — ABNORMAL LOW (ref 4.22–5.81)
RDW: 14.4 % (ref 11.5–15.5)
WBC: 7.3 10*3/uL (ref 4.0–10.5)

## 2018-01-06 LAB — BASIC METABOLIC PANEL
Anion gap: 7 (ref 5–15)
BUN: 11 mg/dL (ref 6–20)
CHLORIDE: 108 mmol/L (ref 101–111)
CO2: 25 mmol/L (ref 22–32)
Calcium: 8.8 mg/dL — ABNORMAL LOW (ref 8.9–10.3)
Creatinine, Ser: 0.94 mg/dL (ref 0.61–1.24)
GFR calc Af Amer: >60 mL/min (ref >60)
GFR calc non Af Amer: >60 mL/min (ref >60)
Glucose, Bld: 104 mg/dL — ABNORMAL HIGH (ref 65–99)
POTASSIUM: 4.2 mmol/L (ref 3.5–5.1)
SODIUM: 140 mmol/L (ref 135–145)

## 2018-01-06 MED ORDER — METOPROLOL SUCCINATE ER 25 MG PO TB24
25.0000 mg | ORAL_TABLET | Freq: Every day | ORAL | Status: DC
  Administered 2018-01-06: 25 mg via ORAL
  Filled 2018-01-06: qty 1

## 2018-01-06 MED ORDER — ROSUVASTATIN CALCIUM 20 MG PO TABS: 20.0000 mg | ORAL_TABLET | Freq: Every day | ORAL | Status: DC

## 2018-01-06 MED ORDER — TICAGRELOR 90 MG PO TABS: 90.0000 mg | ORAL_TABLET | Freq: Two times a day (BID) | ORAL | 0 refills | Status: DC

## 2018-01-06 MED ORDER — EZETIMIBE 10 MG PO TABS
10.0000 mg | ORAL_TABLET | Freq: Every day | ORAL | Status: DC
  Administered 2018-01-06: 10 mg via ORAL
  Filled 2018-01-06: qty 1

## 2018-01-06 MED ORDER — TICAGRELOR 90 MG PO TABS: 90.0000 mg | ORAL_TABLET | Freq: Two times a day (BID) | ORAL | 3 refills | Status: DC

## 2018-01-06 MED ORDER — ROSUVASTATIN CALCIUM 20 MG PO TABS: 20.0000 mg | ORAL_TABLET | Freq: Every day | ORAL | 3 refills | Status: DC

## 2018-01-06 MED ORDER — LISINOPRIL 10 MG PO TABS
20.0000 mg | ORAL_TABLET | Freq: Every day | ORAL | Status: DC
  Administered 2018-01-06: 11:00:00 20 mg via ORAL
  Filled 2018-01-06: qty 2

## 2018-01-06 MED FILL — BRILINTA 90 MG TABLET: 90 | 90 days supply | Qty: 180 | Fill #0

## 2018-01-06 MED FILL — ROSUVASTATIN CALCIUM 20 MG: 20 | 90 days supply | Qty: 90 | Fill #0

## 2018-01-06 NOTE — Progress Notes (Addendum)
Progress Note  Patient Name: [name] Date of Encounter: 01/06/2018  Primary Cardiologist: Olga Millers, MD   Subjective   Denies any CP or SOB  Inpatient Medications    Scheduled Meds: . aspirin  81 mg Oral Daily  . ezetimibe  10 mg Oral Daily  . lisinopril  20 mg Oral Daily  . metoprolol succinate  25 mg Oral Daily  . rosuvastatin  20 mg Oral q1800  . sodium chloride flush  3 mL Intravenous Q12H  . ticagrelor  90 mg Oral BID   Continuous Infusions: . sodium chloride Stopped (01/06/18 0231)  . sodium chloride     PRN Meds: sodium chloride, acetaminophen, diazepam, morphine injection, ondansetron (ZOFRAN) IV, sodium chloride flush   Vital Signs    Vitals:   01/05/18 1615 01/05/18 1933 01/05/18 2000 01/06/18 0554  BP: (!) 149/71 (!) 145/51  138/90  Pulse: 63 62  68  Resp: 16 17 20 17   Temp:  97.7 F (36.5 C)  (!) 97.4 F (36.3 C)  TempSrc:  Oral  Oral  SpO2: 100% 100%  97%  Weight:    236 lb 15.9 oz (107.5 kg)  Height:        Intake/Output Summary (Last 24 hours) at 01/06/2018 0811 Last data filed at 01/06/2018 0554 Gross per 24 hour  Intake 1928.33 ml  Output 1650 ml  Net 278.33 ml   Filed Weights   01/05/18 0622 01/06/18 0554  Weight: 235 lb (106.6 kg) 236 lb 15.9 oz (107.5 kg)    Telemetry    NSR without ventricular ectopy - Personally Reviewed  ECG    NSR with q wave in inferior leads - Personally Reviewed  Physical Exam   GEN: No acute distress.   Neck: No JVD Cardiac: RRR, no murmurs, rubs, or gallops.  L groin cath site clean, dry and no hematoma Respiratory: Clear to auscultation bilaterally. GI: Soft, nontender, non-distended  MS: No edema; No deformity. Neuro:  Nonfocal  Psych: Normal affect   Labs    Chemistry Recent Labs  Lab 01/05/18 0721 01/06/18 0234  NA 140 140  K 3.9 4.2  CL 108 108  CO2 25 25  GLUCOSE 114* 104*  BUN 17 11  CREATININE 0.99 0.94  CALCIUM 8.8* 8.8*  GFRNONAA >60 >60  GFRAA >60 >60    ANIONGAP 7 7     Hematology Recent Labs  Lab 01/04/18 0852 01/06/18 0234  WBC 6.5 7.3  RBC 4.53 4.16*  HGB 13.4 12.2*  HCT 39.4 36.7*  MCV 87 88.2  MCH 29.6 29.3  MCHC 34.0 33.2  RDW 14.6 14.4  PLT 208 171    Cardiac EnzymesNo results for input(s): TROPONINI in the last 168 hours. No results for input(s): TROPIPOC in the last 168 hours.   BNPNo results for input(s): BNP, PROBNP in the last 168 hours.   DDimer No results for input(s): DDIMER in the last 168 hours.   Radiology    No results found.  Cardiac Studies   Cath 01/05/2018 Conclusion     Ost RCA to Prox RCA lesion is 20% stenosed.  Prox LAD lesion is 20% stenosed.  Prox RCA to Dist RCA lesion is 100% stenosed.  Ost LM to Mid LM lesion is 45% stenosed.  There is mild left ventricular systolic dysfunction.  The left ventricular ejection fraction is 50-55% by visual estimate.  LV end diastolic pressure is mildly elevated.  Ost 1st Mrg lesion is 100% stenosed.  Ost 1st Mrg  to 1st Mrg lesion is 40% stenosed.  Origin to Insertion lesion between [facility] and RPDA is 100% stenosed.  Origin to Prox Graft lesion before Ost 1st Mrg is 95% stenosed.  A stent was successfully placed.  Post intervention, there is a 0% residual stenosis.   Multivessel native CAD with 45% mid left main stenosis.  Left main appeared to bifurcate rather than trifurcate into an LAD and circumflex vessel.    The LAD gave rise to an immediate early diagonal vessel which was very small caliber and appeared to have a flush and fill phenomena suggesting that this may have been the presumed ramus branch.  The stent in the proximal LAD was patent with intimal hyperplasia of 20%.  A flush and fill phenomena was seen in the mid LAD due to competitive LIMA filling.  The left circumflex is small caliber.  The obtuse marginal 1 was occluded at its origin.  There was collateralization to the PLA branch of the distal RCA from the left  atrial branch.  The RCA was totally occluded after a previously placed proximal stent.  There was very faint bridging collaterals to small branches.  The PDA and PLA were collateralized via the left injection.     Patient Profile     67 y.o. 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 Abnormal myoview  - cath 01/05/2018 multivessel CAD with 45% mid LM, patent LIMA to LAD, sequential SVG to OM1 and PDA had 95% ost eccentic stenosis and occlusion of sequential limb after the OM1 vessel. 95% ostial SVG was treated with Resolute Onyx 4.0x80mm DES, the second half of squential SVG to PDA remain occluded.   - ASA and Brilinta, discussed compliance with DAPT. High dose statin  - ambulate this morning with cardiac rehab, if no CP or SOB, plan discharge.   2. CAD s/p CABG 06/2016 (LIMA to LAD, SVG to intermediate OM and SVG to PDA)   3. PAD: followed by Dr. Kirke Corin  4. HTN: restart home BP meds  5. HLD: LDL remain high, was on pitavastatin at home, h/o intolerance toward lipitor. He was placed on 80mg  daily lipitor post cath, will switch to 20mg  daily Crestor. Continue zetia. Recheck FLP and LFT in 6-8 weeks, if remain high, will need referral to lipid clinic.  For questions or updates, please contact [facility] Please consult [email or web address] for contact info under Cardiology/STEMI.   Ramond Dial, PA  01/06/2018, 8:11 AM     The patient was seen, examined and discussed with Azalee Course, PA-C and I agree with the above.   67 y.o. 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 95% ostial SVG was treated with Resolute Onyx 4.0x12mm DES, the second half of squential SVG to PDA remain occluded. Started on Brilinta, continue for minimum 12 months. PT this morning, discharge, continue carvedilol, lisinopril, zetia, rosuvastatin, we will arrange for a follow up in the clinic within 1-2 weeks.  Tobias Alexander, MD 01/06/2018

## 2018-01-06 NOTE — Progress Notes (Signed)
CARDIAC REHAB PHASE I   PRE:  Rate/Rhythm: Sinus Rhtyhm  BP:    Sitting: 153/60     SaO2: 98% Room Air  MODE:  Ambulation: 600 ft   POST:  Rate/Rhythem: Sinus 79  BP:    Sitting: 165/65     SaO2: 99% [id number] Patient ambulated in hallway without complaints or chest pain. [name] is interested in participating in phase 2 class at 19 as he is still working. Heart healthy diet, exercise instructions and stent card, sublingual nitro reviewed with the patient.  Thayer Headings RN

## 2018-01-06 NOTE — Discharge Summary (Signed)
Discharge Summary    Patient ID: [name],  MRN: [id number], DOB/AGE: 09-18-1950 67 y.o.  Admit date: 01/05/2018 Discharge date: 01/06/2018  Primary Care Provider: Sherren Mocha Primary Cardiologist: Olga Millers, MD  Discharge Diagnoses    Principal Problem:   Abnormal stress test Active Problems:   Hyperlipidemia LDL goal <70   Essential hypertension   Coronary atherosclerosis   CLAUDICATION   Prediabetes   Atherosclerosis of native artery of both lower extremities with intermittent claudication (HCC)   Unstable angina (HCC)   Allergies Allergies  Allergen Reactions  . Codeine Nausea And Vomiting    Diagnostic Studies/Procedures    Cath 01/05/2018 Conclusion     Ost RCA to Prox RCA lesion is 20% stenosed.  Prox LAD lesion is 20% stenosed.  Prox RCA to Dist RCA lesion is 100% stenosed.  Ost LM to Mid LM lesion is 45% stenosed.  There is mild left ventricular systolic dysfunction.  The left ventricular ejection fraction is 50-55% by visual estimate.  LV end diastolic pressure is mildly elevated.  Ost 1st Mrg lesion is 100% stenosed.  Ost 1st Mrg to 1st Mrg lesion is 40% stenosed.  Origin to Insertion lesion between [facility] and RPDA is 100% stenosed.  Origin to Prox Graft lesion before Ost 1st Mrg is 95% stenosed.  A stent was successfully placed.  Post intervention, there is a 0% residual stenosis.  Multivessel native CAD with 45% mid left main stenosis. Left main appeared to bifurcate rather than trifurcate into an LAD and circumflex vessel.   The LAD gave rise to an immediate early diagonal vessel which was very small caliber and appeared to have a flush and fill phenomena suggesting that this may have been the presumed ramus branch. The stent in the proximal LAD was patent with intimal hyperplasia of 20%. A flush and fill phenomena was seen in the mid LAD due to competitive LIMA filling.  The left circumflex is small caliber.  The obtuse marginal 1 was occluded at its origin. There was collateralization to the PLA branch of the distal RCA from the left atrial branch.  The RCA was totally occluded after a previously placed proximal stent. There was very faint bridging collaterals to small branches. The PDA and PLA were collateralized via the left injection.     _____________   History of Present Illness     [name] is a 67 y.o. 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  PAD, recently seen by Dr. Kirke Corin in the setting of bilateral calf claudication symptoms, worsening over the last year. ABI"s were abnormal revealing moderately reduced right side at 0.65, and mildly reduced on the left 0.82.   Aortogram revealed mild non-obstructive iliac disease, a long occlusion of the distal SFA. There was no significant SFA or popliteal disease with two vessel run-off. The anterior tibial artery was 80% proximally stenosed., He subsequently underwent right common femoral artery to above the knee popliteal artery bypass with non-reversed left greater saphenous vein by Dr. Imogene Burn on 06/28/2017.   Since his surgery the patient has stopped smoking. On last office visit, he had run out of some of his medications and was complaining of chest discomfort with exertion such as unloading a truck and generalized fatigue. Other history includes HTN, COPD, Hyperlipidemia.   On last office visit on 12/15/2017 a NM stress  test was ordered, metoprolol was decreased to metoprolol XL 25 mg daily, and increased lisinopril dose to 10 mg daily. He was given refills on all of his cardiac medications.   12/23/2017   Nuclear stress EF: 49%.  The left ventricular ejection fraction is mildly decreased (45-54%).  Downsloping ST segment depression ST segment depression of 2 mm was noted during stress in the II, III, aVF, V4 and V5  leads.  Defect 1: There is a medium defect of moderate severity present in the basal inferior and basal inferolateral location.  Findings consistent with ischemia in the inferolateral territory.  This is an intermediate risk study.  The patient has had to take 2- 3 doses of nitroglycerin since being seen last office visit, and is continued to take daily since having had stress test.     Hospital Course     Patient underwent cardiac catheterization Myoview on 01/2018 showed multivessel CAD with 45% mid LM, patent LIMA to LAD, sequential SVG to OM1 and PDA had 95% ost eccentic stenosis and occlusion of sequential limb after the OM1 vessel. 95% ostial SVG was treated with Resolute Onyx 4.0x44mm DES, the second half of squential SVG to PDA remain occluded.  Postprocedure, he was placed on aspirin and Brilinta.  He was also placed on high-dose statin.  He was seen on the following 09/2017 by Dr. Delton See, at which time he denies any chest pain or shortness of breath.  Although he was placed on Lipitor 80 mg after cardiac catheterization, he has a history of intolerance to Lipitor.  I will switch him to 20 mg daily of Crestor.  He will continue Zetia as well.  He will need fasting lipid panel and LFT in 6 to 8 weeks, if LDL remain elevated, he will need to be referred to lipid clinic for consideration of PCSK9 inhibitor.  He ambulated with cardiac rehab without any exertional chest discomfort.  He is deemed stable for discharge from cardiology perspective.  He will need to follow-up with Dr. [name] or his APP.  Pletal was discontinued given the need for aspirin and Brilinta.   _____________  Discharge Vitals Blood pressure (!) 165/65, pulse 76, temperature 97.9 F (36.6 C), temperature source Oral, resp. rate 18, height 5\' 10"  (1.778 m), weight 236 lb 15.9 oz (107.5 kg), SpO2 99 %.  Filed Weights   01/05/18 0622 01/06/18 0554  Weight: 235 lb (106.6 kg) 236 lb 15.9 oz (107.5 kg)    Labs &  Radiologic Studies    CBC Recent Labs    01/04/18 0852 01/06/18 0234  WBC 6.5 7.3  HGB 13.4 12.2*  HCT 39.4 36.7*  MCV 87 88.2  PLT 208 171   Basic Metabolic Panel Recent Labs    16/10/96 0721 01/06/18 0234  NA 140 140  K 3.9 4.2  CL 108 108  CO2 25 25  GLUCOSE 114* 104*  BUN 17 11  CREATININE 0.99 0.94  CALCIUM 8.8* 8.8*   _____________  No results found. Disposition   Pt is being discharged home today in good condition.  Follow-up Plans & Appointments     Discharge Instructions    Amb Referral to Cardiac Rehabilitation   Complete by:  As directed    Diagnosis:  Coronary Stents      Discharge Medications   Allergies as of 01/06/2018      Reactions   Codeine Nausea And Vomiting      Medication List    STOP taking these medications  cilostazol 100 MG tablet Commonly known as:  PLETAL   naproxen sodium 220 MG tablet Commonly known as:  ALEVE   Pitavastatin Calcium 2 MG Tabs     TAKE these medications   albuterol 108 (90 Base) MCG/ACT inhaler Commonly known as:  PROVENTIL HFA;VENTOLIN HFA Inhale 2 puffs into the lungs every 4 (four) hours as needed for wheezing or shortness of breath (cough, shortness of breath or wheezing.).   amLODipine 10 MG tablet Commonly known as:  NORVASC Take 1 tablet (10 mg total) by mouth daily.   aspirin EC 81 MG tablet Take 81 mg by mouth daily.   cetirizine 10 MG tablet Commonly known as:  ZYRTEC Take 10 mg by mouth daily.   diphenhydramine-acetaminophen 25-500 MG Tabs tablet Commonly known as:  TYLENOL PM Take 2 tablets by mouth at bedtime.   ezetimibe 10 MG tablet Commonly known as:  ZETIA Take 1 tablet (10 mg total) by mouth daily.   lisinopril 20 MG tablet Commonly known as:  PRINIVIL,ZESTRIL Take 1 tablet (20 mg total) by mouth daily.   metoprolol succinate 25 MG 24 hr tablet Commonly known as:  TOPROL-XL Take 1 tablet (25 mg total) by mouth daily. Take with or immediately following a meal.     MULTIVITAMIN ADULTS PO Take 1 tablet by mouth daily. VitaFusion   nitroGLYCERIN 0.4 MG SL tablet Commonly known as:  NITROSTAT Place 1 tablet (0.4 mg total) under the tongue every 5 (five) minutes as needed for chest pain.   omeprazole 20 MG capsule Commonly known as:  PRILOSEC Take 1 capsule (20 mg total) by mouth daily.   rosuvastatin 20 MG tablet Commonly known as:  CRESTOR Take 1 tablet (20 mg total) by mouth daily. What changed:    medication strength  how much to take   ticagrelor 90 MG Tabs tablet Commonly known as:  BRILINTA Take 1 tablet (90 mg total) by mouth 2 (two) times daily.        Aspirin prescribed at discharge?  Yes High Intensity Statin Prescribed? (Lipitor 40-80mg  or Crestor 20-40mg ): Yes Beta Blocker Prescribed? Yes For EF <40%, was ACEI/ARB Prescribed? Yes ADP Receptor Inhibitor Prescribed? (i.e. Plavix etc.-Includes Medically Managed Patients): Yes For EF <40%, Aldosterone Inhibitor Prescribed? No: EF normal Was EF assessed during THIS hospitalization? Yes Was Cardiac Rehab II ordered? (Included Medically managed Patients): Yes   Outstanding Labs/Studies   FLP and LFT in 6-8 weeks  Duration of Discharge Encounter   Greater than 30 minutes including physician time.  Signed, Azalee Course [place], [place] 01/06/2018, 12:16 PM

## 2018-01-10 ENCOUNTER — Telehealth (HOSPITAL_COMMUNITY): Payer: Self-pay

## 2018-01-10 NOTE — Telephone Encounter (Signed)
Patient is active with [employer] - Waiting on Fax from [facility] to get benefits and eligibility.  Will contact patient to see if he is interested in the Cardiac Rehab Program. If interested, patient will be contacted for scheduling upon review by the RN Navigator.

## 2018-01-10 NOTE — Telephone Encounter (Signed)
Attempted to call patient to see if he is interested in the Cardiac Rehab Program - lm on vm °

## 2018-01-17 MED FILL — AMLODIPINE BESYLATE 10 MG T: 10 | 90 days supply | Qty: 90 | Fill #3

## 2018-01-18 ENCOUNTER — Telehealth (HOSPITAL_COMMUNITY): Payer: Self-pay

## 2018-01-18 NOTE — Telephone Encounter (Signed)
Called and spoke with wife of patient to inform patient his insurance benefits for Cardiac Rehab. Patient will return phone call.

## 2018-01-19 ENCOUNTER — Ambulatory Visit (INDEPENDENT_AMBULATORY_CARE_PROVIDER_SITE_OTHER): Payer: 59 | Admitting: Adult Health

## 2018-01-19 ENCOUNTER — Encounter: Payer: Self-pay | Admitting: Adult Health

## 2018-01-19 VITALS — BP 142/70 | HR 58 | Ht 70.0 in | Wt 232.8 lb

## 2018-01-19 DIAGNOSIS — I219 Acute myocardial infarction, unspecified: Secondary | ICD-10-CM

## 2018-01-19 DIAGNOSIS — Z79899 Other long term (current) drug therapy: Secondary | ICD-10-CM

## 2018-01-19 DIAGNOSIS — I2510 Atherosclerotic heart disease of native coronary artery without angina pectoris: Secondary | ICD-10-CM | POA: Diagnosis not present

## 2018-01-19 DIAGNOSIS — I10 Essential (primary) hypertension: Secondary | ICD-10-CM | POA: Diagnosis not present

## 2018-01-19 DIAGNOSIS — E7800 Pure hypercholesterolemia, unspecified: Secondary | ICD-10-CM | POA: Diagnosis not present

## 2018-01-19 NOTE — Progress Notes (Signed)
Cardiology Office Note   Date:  01/19/2018   ID:  [name], DOB [birth year], MRN [id number]  PCP:  Sherren Mocha, MD  Cardiologist:  Ivor Messier MD  Chief Complaint  Patient presents with  . Follow-up    pt denies chest pains, SOB, swelling in hands/feet, pt does state he is unable to take aleve now and that is causing back issues.     History of Present Illness:  [name] is a 67 y.o. male who presents for posthospitalization follow-up after admission for elective cardiac catheterization in the setting of abnormal stress Myoview.  The patient has a history of coronary artery disease with CABG in October 2017 (LIMA to LAD, SVG to OM and SVG to PDA), with multiple PCI's.  He also has peripheral arterial disease as being followed by Dr. Kirke Corin with bilateral calf claudication symptoms, with worsening ABIs.  The patient ultimately had right common femoral artery above-the-knee popliteal artery bypass with non-reversed greater saphenous vein graft by Dr. Imogene Burn on October 2018.  When seen in the office last the patient had complained of worsening chest discomfort and had run out of medications nuclear stress test was ordered for reevaluation of progressive limb ischemia.  This revealed ischemia in the anterior lateral territory with a medium defect of moderate severity present in the basal inferior and basal inferior lateral location.  Cardiac catheterization was completed which revealed a 45% mid left main, patent LIMA to LAD, sequential SVG to OM1 and PDA had a 95% ostial eccentric stenosis and occlusion of the sequential limb after the OM 1 vessel, 95% ostial SVG which was treated with a drug-eluting stent, the second half of the sequential SVG to PDA was occluded.   The patient was placed on dual antiplatelet therapy with Brilinta and aspirin.  He was started back on high-dose statin therapy.  He will need a follow-up lipids and LFTs in 6 to 8 weeks.  It was noted the Pletal was  discontinued given the need for aspirin and Brilinta.  Patient comes today feeling much better from a cardiac standpoint he has no further chest pain his breathing is better.  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  He was advised to stop taking NSAIDs.  He is having a lot of pain now.  He brought with him CBD Gummies and information on use of hemp for pain control.  He was requesting whether or not he is able to take this along with his cardiovascular medications.   Past Medical History:  Diagnosis Date  . Arthritis   . Asthma    occ bronchitis  . Bronchitis   . COPD (chronic obstructive pulmonary disease) (HCC)   . Coronary artery disease    a. s/p multiple prior PCIs; b. s/p CABG 06/2006 (L-LAD, S-Int and OM, S-PDA);  c. ETT-Myoview 4/13: low risk, no ischemia, EF 53%  . GERD (gastroesophageal reflux disease)   . Hx of echocardiogram    Echo 10/2006: normal LVF  . Hyperlipidemia   . Hypertension   . Myocardial infarction (HCC)   . Peripheral vascular disease (HCC)   . Seasonal allergies   . Wears dentures   . Wears glasses     Past Surgical History:  Procedure Laterality Date  . ABDOMINAL AORTOGRAM N/A 04/28/2017   Procedure: ABDOMINAL AORTOGRAM;  Surgeon: Iran Ouch, MD;  Location: [facility];  Service: Cardiovascular;  Laterality: N/A;  . ACNE  CYST REMOVAL    . CARDIAC CATHETERIZATION    . COLONOSCOPY    . CORONARY ARTERY BYPASS GRAFT  2007  . CORONARY STENT INTERVENTION  01/05/2018  . CORONARY STENT INTERVENTION N/A 01/05/2018   Procedure: CORONARY STENT INTERVENTION;  Surgeon: Lennette Bihari, MD;  Location: [facility];  Service: Cardiovascular;  Laterality: N/A;  . CORONARY STENT PLACEMENT     x5 prior to cabg  . FEMORAL-POPLITEAL BYPASS GRAFT Right 06/28/2017   Procedure: BYPASS GRAFT COMMON FEMORAL- ABOVE KNEE POPLITEAL ARTERY WITH LEFT GREATER SAPHENOUS VEIN;  Surgeon: Fransisco Hertz, MD;   Location: [facility];  Service: Vascular;  Laterality: Right;  . KNEE ARTHROSCOPY     rt knee  . LEFT HEART CATH AND CORS/GRAFTS ANGIOGRAPHY N/A 01/05/2018   Procedure: LEFT HEART CATH AND CORS/GRAFTS ANGIOGRAPHY;  Surgeon: Lennette Bihari, MD;  Location: [facility];  Service: Cardiovascular;  Laterality: N/A;  . LOWER EXTREMITY ANGIOGRAPHY Bilateral 04/28/2017   Procedure: Lower Extremity Angiography;  Surgeon: Iran Ouch, MD;  Location: [facility];  Service: Cardiovascular;  Laterality: Bilateral;  . MULTIPLE TOOTH EXTRACTIONS    . SHOULDER ARTHROSCOPY  12/14/2011   removed bone spurs and repositioned muscle.   . TONSILLECTOMY       Current Outpatient Medications  Medication Sig Dispense Refill  . albuterol (PROVENTIL HFA;VENTOLIN HFA) 108 (90 Base) MCG/ACT inhaler Inhale 2 puffs into the lungs every 4 (four) hours as needed for wheezing or shortness of breath (cough, shortness of breath or wheezing.). 1 Inhaler 0  . amLODipine (NORVASC) 10 MG tablet Take 1 tablet (10 mg total) by mouth daily. 90 tablet 1  . aspirin EC 81 MG tablet Take 81 mg by mouth daily.    . cetirizine (ZYRTEC) 10 MG tablet Take 10 mg by mouth daily.     . diphenhydramine-acetaminophen (TYLENOL PM) 25-500 MG TABS tablet Take 2 tablets by mouth at bedtime.    Marland Kitchen ezetimibe (ZETIA) 10 MG tablet Take 1 tablet (10 mg total) by mouth daily. 30 tablet 3  . lisinopril (PRINIVIL,ZESTRIL) 20 MG tablet Take 1 tablet (20 mg total) by mouth daily. 30 tablet 3  . metoprolol succinate (TOPROL-XL) 25 MG 24 hr tablet Take 1 tablet (25 mg total) by mouth daily. Take with or immediately following a meal. 90 tablet 1  . Multiple Vitamins-Minerals (MULTIVITAMIN ADULTS PO) Take 1 tablet by mouth daily. VitaFusion    . nitroGLYCERIN (NITROSTAT) 0.4 MG SL tablet Place 1 tablet (0.4 mg total) under the tongue every 5 (five) minutes as needed for chest pain. 25 tablet 2  . omeprazole (PRILOSEC) 20 MG capsule Take 1 capsule (20 mg  total) by mouth daily. 30 capsule 2  . rosuvastatin (CRESTOR) 20 MG tablet Take 1 tablet (20 mg total) by mouth daily. 90 tablet 3  . ticagrelor (BRILINTA) 90 MG TABS tablet Take 1 tablet (90 mg total) by mouth 2 (two) times daily. 180 tablet 3   No current facility-administered medications for this visit.     Allergies:   Codeine    Social History:  The patient  reports that he quit smoking about 9 months ago. His smoking use included cigarettes. He smoked 1.00 pack per day. He has never used smokeless tobacco. He reports that he drinks alcohol. He reports that he does not use drugs.   Family History:  The patient's family history includes Coronary artery disease in his father; Diabetes in his mother; Heart disease in  his brother, father, sister, and sister.    ROS: All other systems are reviewed and negative. Unless otherwise mentioned in H&P    PHYSICAL EXAM: VS:  BP (!) 142/70 (BP Location: Left Arm)   Pulse (!) 58   Ht 5\' 10"  (1.778 m)   Wt 232 lb 12.8 oz (105.6 kg)   BMI 33.40 kg/m  , BMI Body mass index is 33.4 kg/m. GEN: Well nourished, well developed, in no acute distress  HEENT: normal  Neck: no JVD, carotid bruits, or masses Cardiac: RRR; no murmurs, rubs, or gallops,no edema  Respiratory:  Clear to auscultation bilaterally, normal work of breathing GI: soft, nontender, nondistended, + BS MS: no deformity or atrophy Right groin site well-healed without evidence of infection or hematoma (catheterization insertion site). Skin: warm and dry, no rash Neuro:  Strength and sensation are intact Psych: euthymic mood, full affect   EKG:    Recent Labs: 12/15/2017: ALT 22 01/06/2018: BUN 11; Creatinine, Ser 0.94; Hemoglobin 12.2; Platelets 171; Potassium 4.2; Sodium 140    Lipid Panel    Component Value Date/Time   CHOL 219 (H) 12/15/2017 0929   TRIG 209 (H) 12/15/2017 0929   HDL 41 12/15/2017 0929   CHOLHDL 5.3 (H) 12/15/2017 0929   CHOLHDL 5.4 06/29/2017 0139    VLDL 37 06/29/2017 0139   LDLCALC 136 (H) 12/15/2017 0929   LDLDIRECT 91.0 06/16/2013 0906      Wt Readings from Last 3 Encounters:  01/19/18 232 lb 12.8 oz (105.6 kg)  01/06/18 236 lb 15.9 oz (107.5 kg)  01/04/18 237 lb 3.2 oz (107.6 kg)      Other studies Reviewed: Conclusion     Ost RCA to Prox RCA lesion is 20% stenosed.  Prox LAD lesion is 20% stenosed.  Prox RCA to Dist RCA lesion is 100% stenosed.  Ost LM to Mid LM lesion is 45% stenosed.  There is mild left ventricular systolic dysfunction.  The left ventricular ejection fraction is 50-55% by visual estimate.  LV end diastolic pressure is mildly elevated.  Ost 1st Mrg lesion is 100% stenosed.  Ost 1st Mrg to 1st Mrg lesion is 40% stenosed.  Origin to Insertion lesion between [facility] and RPDA is 100% stenosed.  Origin to Prox Graft lesion before Ost 1st Mrg is 95% stenosed.  A stent was successfully placed.  Post intervention, there is a 0% residual stenosis.  Multivessel native CAD with 45% mid left main stenosis. Left main appeared to bifurcate rather than trifurcate into an LAD and circumflex vessel.       ASSESSMENT AND PLAN:  1.  Coronary artery disease: History of coronary artery bypass grafting with recent abnormal stress Myoview revealing moderate risk.  Patient had cardiac catheterization as above with stenting to the proximal LIMA.  The patient is now on dual antiplatelet therapy with Brilinta and aspirin and has been placed on high-dose statin.  He will have follow-up lipids and LFTs in 6 weeks and continue statin therapy for minimum of 6 months and discussion to be able to discontinue this will be had at that time.  Concerning the use of CBD products, and help, there are no clinical studies related to use of antiplatelet therapy available on my research which support the use of CBD or recommend it as a contraindication in the setting of antiplatelet therapy.  I will do further research  on this subject.  The patient wishes to try this for pain control.  I have also advised  him to speak with his primary care physician concerning other alternatives for pain control.  Will discuss further with pharmacy to give more informed information.  2.Hypercholesterolemia:  Recently started on high dose statin. Will continue this with LDL goal < 70. Follow up labs in 6 weeks.   3.. PAD: Followed by Dr.Chen and Dr. Kirke Corin. S/.P right common femoral artery to above the knee popliteal bypass. He has been taken off of Pletal in the setting of DAPT.    Current medicines are reviewed at length with the patient today.    Labs/ tests ordered today include: Lipids and LFT's 6 weeks.   Bettey Mare. Liborio Nixon, ANP, AACC   01/19/2018 9:58 AM    [facility] [address], [place], [place] [address] Phone: [phone]; Fax: [phone]

## 2018-01-19 NOTE — Patient Instructions (Signed)
Medication Instructions:  NO CHANGES- Your physician recommends that you continue on your current medications as directed. Please refer to the Current Medication list given to you today.  If you need a refill on your cardiac medications before your next appointment, please call your pharmacy.  Labwork: LFT AND FASTING LIPID IN 6 WEEKS (ABOUT 03-02-18) HERE IN OUR OFFICE AT [facility]  Take the provided lab slips with you to the lab for your blood draw.   You will need to fast. DO NOT EAT OR DRINK PAST MIDNIGHT.   Follow-Up: Your physician wants you to follow-up in: 6 MONTHS WITH [facility]. You should receive a reminder letter in the mail two months in advance. If you do not receive a letter, please call our office SEPT-2019 to schedule the NOV 2019 follow-up appointment.   Thank you for choosing [facility] at [facility]!!

## 2018-02-04 ENCOUNTER — Other Ambulatory Visit: Payer: Self-pay

## 2018-02-04 DIAGNOSIS — I70213 Atherosclerosis of native arteries of extremities with intermittent claudication, bilateral legs: Secondary | ICD-10-CM

## 2018-02-09 ENCOUNTER — Telehealth (HOSPITAL_COMMUNITY): Payer: Self-pay

## 2018-02-09 NOTE — Telephone Encounter (Signed)
Attempted to follow up with patient in regards to Cardiac Rehab - lm on vm °

## 2018-02-15 NOTE — Progress Notes (Signed)
Established Previous Bypass   History of Present Illness   [name] is a 67 y.o. (30-Jun-1951) male who presents with chief complaint: no complaints.    Prior procedures include: 1. R CFA to AK pop BPG w/ NR L GSV for occupation limiting intermittent claudication (06/28/17)   The patient's symptoms have not progressed.  The patient's symptoms are: none. The patient's treatment regimen currently included: maximal medical management.  The patient's PMH, PSH, SH, and FamHx were reviewed on 02/16/18 are unchanged from 11/10/17.  Current Outpatient Medications  Medication Sig Dispense Refill  . albuterol (PROVENTIL HFA;VENTOLIN HFA) 108 (90 Base) MCG/ACT inhaler Inhale 2 puffs into the lungs every 4 (four) hours as needed for wheezing or shortness of breath (cough, shortness of breath or wheezing.). 1 Inhaler 0  . amLODipine (NORVASC) 10 MG tablet Take 1 tablet (10 mg total) by mouth daily. 90 tablet 1  . aspirin EC 81 MG tablet Take 81 mg by mouth daily.    . cetirizine (ZYRTEC) 10 MG tablet Take 10 mg by mouth daily.     . diphenhydramine-acetaminophen (TYLENOL PM) 25-500 MG TABS tablet Take 2 tablets by mouth at bedtime.    Marland Kitchen ezetimibe (ZETIA) 10 MG tablet Take 1 tablet (10 mg total) by mouth daily. 30 tablet 3  . lisinopril (PRINIVIL,ZESTRIL) 20 MG tablet Take 1 tablet (20 mg total) by mouth daily. 30 tablet 3  . metoprolol succinate (TOPROL-XL) 25 MG 24 hr tablet Take 1 tablet (25 mg total) by mouth daily. Take with or immediately following a meal. 90 tablet 1  . Multiple Vitamins-Minerals (MULTIVITAMIN ADULTS PO) Take 1 tablet by mouth daily. VitaFusion    . nitroGLYCERIN (NITROSTAT) 0.4 MG SL tablet Place 1 tablet (0.4 mg total) under the tongue every 5 (five) minutes as needed for chest pain. 25 tablet 2  . omeprazole (PRILOSEC) 20 MG capsule Take 1 capsule (20 mg total) by mouth daily. 30 capsule 2  . rosuvastatin (CRESTOR) 20 MG tablet Take 1 tablet (20 mg total) by mouth  daily. 90 tablet 3  . ticagrelor (BRILINTA) 90 MG TABS tablet Take 1 tablet (90 mg total) by mouth 2 (two) times daily. 180 tablet 3   No current facility-administered medications for this visit.     On ROS today: no intermittent claudication , no wounds   Physical Examination   Vitals:   02/16/18 1048  BP: (!) 148/76  Pulse: (!) 57  Temp: (!) 97.2 F (36.2 C)  SpO2: 99%  Weight: 235 lb (106.6 kg)  Height: 5\' 10"  (1.778 m)   Body mass index is 33.72 kg/m.  General Alert, O x 3, WD, NAD  Pulmonary Sym exp, good B air movt, CTA B  Cardiac RRR, Nl S1, S2, no Murmurs, No rubs, No S3,S4  Vascular Vessel Right Left  Radial Palpable Palpable  Brachial Palpable Palpable  Carotid Palpable, No Bruit Palpable, No Bruit  Aorta Not palpable N/A  Femoral Palpable Palpable  Popliteal Not palpable Not palpable  PT Not palpable Not palpable  DP Palpable Palpable    Gastro- intestinal soft, non-distended, non-tender to palpation, No guarding or rebound, no HSM, no masses, no CVAT B, No palpable prominent aortic pulse,    Musculo- skeletal M/S 5/5 throughout  , Extremities without ischemic changes  , No edema present, No visible varicosities , No Lipodermatosclerosis present  Neurologic Pain and light touch intact in extremities , Motor exam as listed above    Non-invasive Vascular Imaging  ABI (02/16/2018)  R:   ABI: 0.73 (1.10),   PT: bi  DP: mono  TBI:  0.44  L:   ABI: 0.93 (1.02),   PT: bi  DP: mono  TBI: 0.55   Bypass Duplex (02/16/2018)  Proximal to bypass: 148 c/s  With-in bypass: 105-213 c/s  Distal to bypass: 217 c/s   Medical Decision Making   [name] is a 67 y.o. male who presents with: s/p R CFA to AK pop BPG w/ NR L GSV   Distal SFA stenosis expected due to disease distal target.  Vein conduit was also not optimal.  I recommended: R leg runoff and intervention.  Pt would like to hold off as his wife has some medical issues  currently.  Based on the patient's vascular studies and examination, I have offered the patient: BLE ABI, bypass duplex, and aortoiliac duplex in 3 months.  I discussed in depth with the patient the nature of atherosclerosis, and emphasized the importance of maximal medical management including strict control of blood pressure, blood glucose, and lipid levels, obtaining regular exercise, and cessation of smoking.    The patient is aware that without maximal medical management the underlying atherosclerotic disease process will progress, limiting the benefit of any interventions.  I discussed in depth with the patient the need for long term surveillance to improve the primary assisted patency of his bypass.  The patient agrees to cooperate with such.  The patient is currently on Pitavastin   The patient is currently on an anti-platelet: ASA.  Thank you for allowing [place] to participate in this patient's care.   [name], MD, FACS [facility] Office: [phone] Pager: [phone]

## 2018-02-16 ENCOUNTER — Ambulatory Visit (INDEPENDENT_AMBULATORY_CARE_PROVIDER_SITE_OTHER): Payer: 59 | Admitting: Vascular Surgery

## 2018-02-16 ENCOUNTER — Encounter: Payer: Self-pay | Admitting: Vascular Surgery

## 2018-02-16 ENCOUNTER — Ambulatory Visit (INDEPENDENT_AMBULATORY_CARE_PROVIDER_SITE_OTHER)
Admission: RE | Admit: 2018-02-16 | Discharge: 2018-02-16 | Disposition: A | Discharge status: Home / Self Care | Payer: 59 | Source: Ambulatory Visit | Attending: Vascular Surgery | Admitting: Vascular Surgery

## 2018-02-16 ENCOUNTER — Other Ambulatory Visit: Payer: Self-pay

## 2018-02-16 ENCOUNTER — Ambulatory Visit (HOSPITAL_COMMUNITY)
Admission: RE | Admit: 2018-02-16 | Discharge: 2018-02-16 | Disposition: A | Discharge status: Home / Self Care | Payer: 59 | Source: Ambulatory Visit | Attending: Vascular Surgery | Admitting: Vascular Surgery

## 2018-02-16 VITALS — BP 148/76 | HR 57 | Temp 97.2°F | Ht 70.0 in | Wt 235.0 lb

## 2018-02-16 DIAGNOSIS — I70213 Atherosclerosis of native arteries of extremities with intermittent claudication, bilateral legs: Secondary | ICD-10-CM

## 2018-02-16 DIAGNOSIS — Z87891 Personal history of nicotine dependence: Secondary | ICD-10-CM | POA: Insufficient documentation

## 2018-02-16 DIAGNOSIS — I10 Essential (primary) hypertension: Secondary | ICD-10-CM | POA: Diagnosis not present

## 2018-02-16 DIAGNOSIS — E785 Hyperlipidemia, unspecified: Secondary | ICD-10-CM | POA: Insufficient documentation

## 2018-02-16 DIAGNOSIS — Z95828 Presence of other vascular implants and grafts: Secondary | ICD-10-CM | POA: Insufficient documentation

## 2018-02-23 ENCOUNTER — Telehealth (HOSPITAL_COMMUNITY): Payer: Self-pay

## 2018-02-23 NOTE — Telephone Encounter (Signed)
Called and spoke with wife of patient in regards to Cardiac Rehab - Wife stated she and [name] had a conversation yesterday about Cardiac Rehab and stated he cannot afford 40% of the program. She stated in October, they may try to get new insurance and will call if and when that happens. Closed referral.

## 2018-03-01 ENCOUNTER — Encounter: Payer: Self-pay | Admitting: Family Medicine

## 2018-03-04 ENCOUNTER — Encounter: Payer: Self-pay | Admitting: Physician Assistant

## 2018-03-04 ENCOUNTER — Ambulatory Visit (INDEPENDENT_AMBULATORY_CARE_PROVIDER_SITE_OTHER): Payer: 59 | Admitting: Physician Assistant

## 2018-03-04 VITALS — BP 134/64 | HR 69 | Temp 97.7°F | Resp 16 | Ht 68.5 in | Wt 236.2 lb

## 2018-03-04 DIAGNOSIS — R7303 Prediabetes: Secondary | ICD-10-CM | POA: Diagnosis not present

## 2018-03-04 DIAGNOSIS — K219 Gastro-esophageal reflux disease without esophagitis: Secondary | ICD-10-CM

## 2018-03-04 DIAGNOSIS — Z1211 Encounter for screening for malignant neoplasm of colon: Secondary | ICD-10-CM | POA: Diagnosis not present

## 2018-03-04 DIAGNOSIS — Z125 Encounter for screening for malignant neoplasm of prostate: Secondary | ICD-10-CM

## 2018-03-04 DIAGNOSIS — Z13228 Encounter for screening for other metabolic disorders: Secondary | ICD-10-CM | POA: Diagnosis not present

## 2018-03-04 DIAGNOSIS — Z130 Encounter for screening for diseases of the blood and blood-forming organs and certain disorders involving the immune mechanism: Secondary | ICD-10-CM

## 2018-03-04 DIAGNOSIS — Z0000 Encounter for general adult medical examination without abnormal findings: Secondary | ICD-10-CM | POA: Diagnosis not present

## 2018-03-04 DIAGNOSIS — Z1329 Encounter for screening for other suspected endocrine disorder: Secondary | ICD-10-CM | POA: Diagnosis not present

## 2018-03-04 LAB — POCT URINALYSIS DIP (MANUAL ENTRY)
Bilirubin, UA: NEGATIVE
Blood, UA: NEGATIVE
Glucose, UA: NEGATIVE mg/dL
Leukocytes, UA: NEGATIVE
Nitrite, UA: NEGATIVE
Protein Ur, POC: NEGATIVE mg/dL
Spec Grav, UA: 1.025 (ref 1.010–1.025)
Urobilinogen, UA: 0.2 E.U./dL
pH, UA: 5 (ref 5.0–8.0)

## 2018-03-04 MED ORDER — OMEPRAZOLE 20 MG PO CPDR: 20.0000 mg | DELAYED_RELEASE_CAPSULE | Freq: Every day | ORAL | 2 refills | Status: DC

## 2018-03-04 MED FILL — OMEPRAZOLE 20 MG CAP: 20 | 30 days supply | Qty: 30 | Fill #0

## 2018-03-04 NOTE — Patient Instructions (Addendum)
You will receive a phone call to schedule an appointment for a colonoscopy We will contact you with the results of your labs when they come back Try to start a regular exercise routine Consider getting your pneumococcal vaccine   Health Maintenance, Male A healthy lifestyle and preventive care is important for your health and wellness. Ask your health care provider about what schedule of regular examinations is right for you. What should I know about weight and diet? Eat a Healthy Diet  Eat plenty of vegetables, fruits, whole grains, low-fat dairy products, and lean protein.  Do not eat a lot of foods high in solid fats, added sugars, or salt.  Maintain a Healthy Weight Regular exercise can help you achieve or maintain a healthy weight. You should:  Do at least 150 minutes of exercise each week. The exercise should increase your heart rate and make you sweat (moderate-intensity exercise).  Do strength-training exercises at least twice a week.  Watch Your Levels of Cholesterol and Blood Lipids  Have your blood tested for lipids and cholesterol every 5 years starting at 66 years of age. If you are at high risk for heart disease, you should start having your blood tested when you are 66 years old. You may need to have your cholesterol levels checked more often if: ? Your lipid or cholesterol levels are high. ? You are older than 65 years of age. ? You are at high risk for heart disease.  What should I know about cancer screening? Many types of cancers can be detected early and may often be prevented. Lung Cancer  You should be screened every year for lung cancer if: ? You are a current smoker who has smoked for at least 30 years. ? You are a former smoker who has quit within the past 15 years.  Talk to your health care provider about your screening options, when you should start screening, and how often you should be screened.  Colorectal Cancer  Routine colorectal cancer  screening usually begins at 66 years of age and should be repeated every 5-10 years until you are 66 years old. You may need to be screened more often if early forms of precancerous polyps or small growths are found. Your health care provider may recommend screening at an earlier age if you have risk factors for colon cancer.  Your health care provider may recommend using home test kits to check for hidden blood in the stool.  A small camera at the end of a tube can be used to examine your colon (sigmoidoscopy or colonoscopy). This checks for the earliest forms of colorectal cancer.  Prostate and Testicular Cancer  Depending on your age and overall health, your health care provider may do certain tests to screen for prostate and testicular cancer.  Talk to your health care provider about any symptoms or concerns you have about testicular or prostate cancer.  Skin Cancer  Check your skin from head to toe regularly.  Tell your health care provider about any new moles or changes in moles, especially if: ? There is a change in a mole's size, shape, or color. ? You have a mole that is larger than a pencil eraser.  Always use sunscreen. Apply sunscreen liberally and repeat throughout the day.  Protect yourself by wearing long sleeves, pants, a wide-brimmed hat, and sunglasses when outside.  What should I know about heart disease, diabetes, and high blood pressure?  If you are 72-1 years of age, have your  blood pressure checked every 3-5 years. If you are 64 years of age or older, have your blood pressure checked every year. You should have your blood pressure measured twice-once when you are at a hospital or clinic, and once when you are not at a hospital or clinic. Record the average of the two measurements. To check your blood pressure when you are not at a hospital or clinic, you can use: ? An automated blood pressure machine at a pharmacy. ? A home blood pressure monitor.  Talk to your  health care provider about your target blood pressure.  If you are between 36-15 years old, ask your health care provider if you should take aspirin to prevent heart disease.  Have regular diabetes screenings by checking your fasting blood sugar level. ? If you are at a normal weight and have a low risk for diabetes, have this test once every three years after the age of 38. ? If you are overweight and have a high risk for diabetes, consider being tested at a younger age or more often.  A one-time screening for abdominal aortic aneurysm (AAA) by ultrasound is recommended for men aged 65-75 years who are current or former smokers. What should I know about preventing infection? Hepatitis B If you have a higher risk for hepatitis B, you should be screened for this virus. Talk with your health care provider to find out if you are at risk for hepatitis B infection. Hepatitis C Blood testing is recommended for:  Everyone born from [birth year] through 1964.  Anyone with known risk factors for hepatitis C.  Sexually Transmitted Diseases (STDs)  You should be screened each year for STDs including gonorrhea and chlamydia if: ? You are sexually active and are younger than 66 years of age. ? You are older than 66 years of age and your health care provider tells you that you are at risk for this type of infection. ? Your sexual activity has changed since you were last screened and you are at an increased risk for chlamydia or gonorrhea. Ask your health care provider if you are at risk.  Talk with your health care provider about whether you are at high risk of being infected with HIV. Your health care provider may recommend a prescription medicine to help prevent HIV infection.  What else can I do?  Schedule regular health, dental, and eye exams.  Stay current with your vaccines (immunizations).  Do not use any tobacco products, such as cigarettes, chewing tobacco, and e-cigarettes. If you need help  quitting, ask your health care provider.  Limit alcohol intake to no more than 2 drinks per day. One drink equals 12 ounces of beer, 5 ounces of wine, or 1 ounces of hard liquor.  Do not use street drugs.  Do not share needles.  Ask your health care provider for help if you need support or information about quitting drugs.  Tell your health care provider if you often feel depressed.  Tell your health care provider if you have ever been abused or do not feel safe at home. This information is not intended to replace advice given to you by your health care provider. Make sure you discuss any questions you have with your health care provider. Document Released: 02/20/2008 Document Revised: 04/22/2016 Document Reviewed: 05/28/2015 Elsevier Interactive Patient Education  2018 [employer].   IF you received an x-ray today, you will receive an invoice from [facility]. Please contact [facility] at [phone] with questions  or concerns regarding your invoice.   IF you received labwork today, you will receive an invoice from [facility]. Please contact [facility] at [phone] with questions or concerns regarding your invoice.   Our billing staff will not be able to assist you with questions regarding bills from these companies.  You will be contacted with the lab results as soon as they are available. The fastest way to get your results is to activate your My Chart account. Instructions are located on the last page of this paperwork. If you have not heard from [place] regarding the results in 2 weeks, please contact this office.

## 2018-03-04 NOTE — Progress Notes (Signed)
[facility] [place] [id number] [phone]  Date:  03/04/2018   Name:  [name]   DOB:  20-Mar-1951   MRN:  [id number]  PCP:  Shawnee Knapp, MD    Chief Complaint: Annual Exam (per pt had cardiac stent 3 months ago, (overdue health maint pt needs Opth appt., Prevnar 13, colonoscopy)) and Medication Refill (Omeprazole Prilosec 20 mg)   History of Present Illness:  This is a 67 y.o. male who  has a past medical history of Arthritis, Asthma, Bronchitis, COPD (chronic obstructive pulmonary disease) ([place]), Coronary artery disease, GERD (gastroesophageal reflux disease), echocardiogram, Hyperlipidemia, Hypertension, Myocardial infarction [facility]), Peripheral vascular disease ([place]), Seasonal allergies, Wears dentures, and Wears glasses.  who is presenting for CPE. 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   CAD with CABG in October 2017   - Pt had a cardiac stent 3 months ago. Followed by Cardiology, Dr. Stanford Breed. Last OV last month.  Peripheral arterial disease as being followed by Dr. Fletcher Anon  PVD - Dr. Bridgett Larsson. "I got new veins in my legs"  HTN- controlled with Norvasc '10mg'$ , metoprolol '25mg'$  and lisinopril '20mg'$ . today's blood pressure is 134/64  HLD - Crestor 20 mg daily. He is not taking Zetia '10mg'$  - Cardiologists d/c'd this medication. He could not tolerate lipitor 2/2 leg cramps  GERD - omeprazole '20mg'$   Prediabetes - Lab Results  Component Value Date   HGBA1C 6.7 07/08/2017   Complaints: None.  Immunizations: needs PNA - declines this today Eye: wears glasses. 20/20 b/l  Diet: eats fast food most days of the week. His wife eats [employer], so he eats that when at home.  Exercise: none Fam hx: family history includes Coronary artery disease in his father; Diabetes in his mother; Heart disease in his brother, father, sister, and sister. Sexual hx: not active.  Urinary hesitancy/frequency or nocturia: 4-5x/night Problems with  erectile dysfunction: n/a Tobacco/alcohol/substance use: Pt stopped smoking 1 year ago. He tried a Medical illustrator, which worked.   Colonoscopy: 02/15/2007. Negative.    Review of Systems:  Review of Systems  Constitutional: Negative for activity change, appetite change and fatigue.  HENT: Negative for congestion, dental problem, sneezing and tinnitus.   Eyes: Negative for visual disturbance.  Respiratory: Negative for cough, chest tightness, shortness of breath and wheezing.   Cardiovascular: Negative for chest pain, palpitations and leg swelling.  Gastrointestinal: Negative for abdominal pain, blood in stool, constipation, diarrhea, nausea and vomiting.  Endocrine: Negative for polydipsia, polyphagia and polyuria.  Genitourinary: Negative for decreased urine volume, difficulty urinating, discharge, hematuria, scrotal swelling and testicular pain.  Musculoskeletal: Negative for arthralgias, back pain and neck stiffness.  Allergic/Immunologic: Negative for environmental allergies and food allergies.  Neurological: Negative for dizziness, syncope, weakness, light-headedness and headaches.  Psychiatric/Behavioral: Negative for sleep disturbance. The patient is not nervous/anxious.     Patient Active Problem List   Diagnosis Date Noted  . Abnormal stress test 01/06/2018  . Unstable angina ([place])   . Status post bypass graft of extremity 10/20/2017  . Atherosclerosis of native artery of both lower extremities with intermittent claudication ([place]) 06/18/2017  . Peripheral arterial occlusive disease ([place]) 04/09/2017  . Bruit 10/23/2015  . Prediabetes 12/19/2014  . GERD (gastroesophageal reflux disease) 12/19/2014  . CLAUDICATION 09/04/2010  . Hyperlipidemia LDL goal <70 08/12/2009  . Essential hypertension 08/12/2009  . Acute myocardial infarction ([place]) 08/12/2009  . Coronary atherosclerosis 08/12/2009  . ISCHEMIC CARDIOMYOPATHY 08/12/2009  .  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 SYNCOPE  08/12/2009  . Insomnia 08/12/2009    Prior to Admission medications   Medication Sig Start Date End Date Taking? Authorizing Provider  albuterol (PROVENTIL HFA;VENTOLIN HFA) 108 (90 Base) MCG/ACT inhaler Inhale 2 puffs into the lungs every 4 (four) hours as needed for wheezing or shortness of breath (cough, shortness of breath or wheezing.). 11/02/16  Yes Shawnee Knapp, MD  amLODipine (NORVASC) 10 MG tablet Take 1 tablet (10 mg total) by mouth daily. 12/15/17  Yes Lendon Colonel, NP  aspirin EC 81 MG tablet Take 81 mg by mouth daily.   Yes [provider]  cetirizine (ZYRTEC) 10 MG tablet Take 10 mg by mouth daily.    Yes [provider]  diphenhydramine-acetaminophen (TYLENOL PM) 25-500 MG TABS tablet Take 2 tablets by mouth at bedtime.   Yes [provider]  lisinopril (PRINIVIL,ZESTRIL) 20 MG tablet Take 1 tablet (20 mg total) by mouth daily. 01/04/18  Yes Lendon Colonel, NP  metoprolol succinate (TOPROL-XL) 25 MG 24 hr tablet Take 1 tablet (25 mg total) by mouth daily. Take with or immediately following a meal. 12/15/17  Yes Lendon Colonel, NP  nitroGLYCERIN (NITROSTAT) 0.4 MG SL tablet Place 1 tablet (0.4 mg total) under the tongue every 5 (five) minutes as needed for chest pain. 12/15/17  Yes Lendon Colonel, NP  omeprazole (PRILOSEC) 20 MG capsule Take 1 capsule (20 mg total) by mouth daily. 08/04/17  Yes Shawnee Knapp, MD  rosuvastatin (CRESTOR) 20 MG tablet Take 1 tablet (20 mg total) by mouth daily. 01/06/18  Yes Almyra Deforest, PA  ticagrelor (BRILINTA) 90 MG TABS tablet Take 1 tablet (90 mg total) by mouth 2 (two) times daily. 01/06/18  Yes Almyra Deforest, PA  ezetimibe (ZETIA) 10 MG tablet Take 1 tablet (10 mg total) by mouth daily. Patient not taking: Reported on 03/04/2018 12/16/17 03/16/18  Lendon Colonel, NP  Multiple Vitamins-Minerals (MULTIVITAMIN ADULTS PO) Take 1 tablet by mouth daily. VitaFusion    [provider]    Allergies  Allergen  Reactions  . Codeine Nausea And Vomiting    Past Surgical History:  Procedure Laterality Date  . ABDOMINAL AORTOGRAM N/A 04/28/2017   Procedure: ABDOMINAL AORTOGRAM;  Surgeon: Wellington Hampshire, MD;  Location: [place] [facility];  Service: Cardiovascular;  Laterality: N/A;  . ACNE CYST REMOVAL    . CARDIAC CATHETERIZATION    . COLONOSCOPY    . CORONARY ARTERY BYPASS GRAFT  2007  . CORONARY STENT INTERVENTION  01/05/2018  . CORONARY STENT INTERVENTION N/A 01/05/2018   Procedure: CORONARY STENT INTERVENTION;  Surgeon: Troy Sine, MD;  Location: [facility];  Service: Cardiovascular;  Laterality: N/A;  . CORONARY STENT PLACEMENT     x5 prior to cabg  . FEMORAL-POPLITEAL BYPASS GRAFT Right 06/28/2017   Procedure: BYPASS GRAFT COMMON FEMORAL- ABOVE KNEE POPLITEAL ARTERY WITH LEFT GREATER SAPHENOUS VEIN;  Surgeon: Conrad Arecibo, MD;  Location: [facility];  Service: Vascular;  Laterality: Right;  . KNEE ARTHROSCOPY     rt knee  . LEFT HEART CATH AND CORS/GRAFTS ANGIOGRAPHY N/A 01/05/2018   Procedure: LEFT HEART CATH AND CORS/GRAFTS ANGIOGRAPHY;  Surgeon: Troy Sine, MD;  Location: [place] [facility];  Service: Cardiovascular;  Laterality: N/A;  . LOWER EXTREMITY ANGIOGRAPHY Bilateral 04/28/2017   Procedure: Lower Extremity Angiography;  Surgeon: Wellington Hampshire, MD;  Location: [facility];  Service: Cardiovascular;  Laterality: Bilateral;  .  MULTIPLE TOOTH EXTRACTIONS    . SHOULDER ARTHROSCOPY  12/14/2011   removed bone spurs and repositioned muscle.   . TONSILLECTOMY      Social History   Tobacco Use  . Smoking status: Former Smoker    Packs/day: 1.00    Types: Cigarettes    Last attempt to quit: 03/22/2017    Years since quitting: 0.9  . Smokeless tobacco: Never Used  Substance Use Topics  . Alcohol use: Yes    Alcohol/week: 0.0 oz    Comment: occasional - BEER OR WINE  . Drug use: No    Family History  Problem Relation Age of Onset  . Coronary artery disease  Father        brothers, sisters  . Heart disease Father   . Diabetes Mother   . Heart disease Sister   . Heart disease Sister   . Heart disease Brother   . Anesthesia problems Neg Hx     Medication list has been reviewed and updated.  Physical Examination:  Physical Exam  Constitutional: He is oriented to person, place, and time. No distress.  HENT:  Head: Normocephalic and atraumatic.  Right Ear: External ear normal.  Left Ear: External ear normal.  Nose: Nose normal.  Mouth/Throat: Oropharynx is clear and moist. No oropharyngeal exudate.  Eyes: Pupils are equal, round, and reactive to light. Conjunctivae and EOM are normal. Right eye exhibits no discharge. No scleral icterus.  Neck: Normal range of motion. Neck supple. No tracheal deviation present. No thyromegaly present.  Cardiovascular: Normal rate, regular rhythm and intact distal pulses.  No murmur heard. Pulmonary/Chest: Effort normal and breath sounds normal. No respiratory distress. He has no wheezes.  Abdominal: Soft. Bowel sounds are normal. He exhibits no distension and no mass. There is no tenderness.  Genitourinary: No discharge found.  Musculoskeletal: Normal range of motion.  Lymphadenopathy:    He has no cervical adenopathy.  Neurological: He is alert and oriented to person, place, and time. He has normal reflexes.  Skin: Skin is warm and dry. He is not diaphoretic.  Psychiatric: Judgment normal.    BP 134/64 (BP Location: Left Arm, Patient Position: Sitting, Cuff Size: Normal)   Pulse 69   Temp 97.7 F (36.5 C) (Oral)   Resp 16   Ht 5' 8.5" (1.74 m)   Wt 236 lb 3.2 oz (107.1 kg)   SpO2 98%   BMI 35.39 kg/m   Assessment and Plan: 1. Annual physical exam - pt presents for annual exam.  He is feeling well today.  Referral placed for colonoscopy.  He declines pneumococcal vaccination.  Routine labs are pending will contact with results.  Health maintenance discussed  2. Screening for endocrine,  metabolic and immunity disorder - Lipid panel - POCT urinalysis dipstick - CMP14+EGFR - CBC with Differential/Platelet  3. Prediabetes - Hemoglobin A1c  4. Screen for colon cancer - Ambulatory referral to Gastroenterology  5. Screening for prostate cancer - PSA  6. Gastroesophageal reflux disease, esophagitis presence not specified - omeprazole (PRILOSEC) 20 MG capsule; Take 1 capsule (20 mg total) by mouth daily.  Dispense: 30 capsule; Refill: 2  Mercer Pod, PA-C  [facility] 03/04/2018 3:03 PM

## 2018-03-05 LAB — LIPID PANEL
Chol/HDL Ratio: 3.7 ratio (ref 0.0–5.0)
Cholesterol, Total: 161 mg/dL (ref 100–199)
HDL: 43 mg/dL (ref >39)
LDL Calculated: 52 mg/dL (ref 0–99)
Triglycerides: 329 mg/dL — ABNORMAL HIGH (ref 0–149)
VLDL Cholesterol Cal: 66 mg/dL — ABNORMAL HIGH (ref 5–40)

## 2018-03-05 LAB — CBC WITH DIFFERENTIAL/PLATELET
Basophils Absolute: 0 10*3/uL (ref 0.0–0.2)
Basos: 1 %
EOS (ABSOLUTE): 0.4 10*3/uL (ref 0.0–0.4)
Eos: 5 %
Hematocrit: 39.2 % (ref 37.5–51.0)
Hemoglobin: 12.9 g/dL — ABNORMAL LOW (ref 13.0–17.7)
Immature Grans (Abs): 0 10*3/uL (ref 0.0–0.1)
Immature Granulocytes: 0 %
Lymphocytes Absolute: 1.8 10*3/uL (ref 0.7–3.1)
Lymphs: 26 %
MCH: 29 pg (ref 26.6–33.0)
MCHC: 32.9 g/dL (ref 31.5–35.7)
MCV: 88 fL (ref 79–97)
Monocytes Absolute: 0.8 10*3/uL (ref 0.1–0.9)
Monocytes: 11 %
Neutrophils Absolute: 4.1 10*3/uL (ref 1.4–7.0)
Neutrophils: 57 %
Platelets: 219 10*3/uL (ref 150–450)
RBC: 4.45 x10E6/uL (ref 4.14–5.80)
RDW: 14.5 % (ref 12.3–15.4)
WBC: 7.2 10*3/uL (ref 3.4–10.8)

## 2018-03-05 LAB — CMP14+EGFR
ALT: 23 IU/L (ref 0–44)
AST: 16 IU/L (ref 0–40)
Albumin/Globulin Ratio: 1.5 (ref 1.2–2.2)
Albumin: 4.4 g/dL (ref 3.6–4.8)
Alkaline Phosphatase: 106 IU/L (ref 39–117)
BUN/Creatinine Ratio: 17 (ref 10–24)
BUN: 23 mg/dL (ref 8–27)
Bilirubin Total: 0.2 mg/dL (ref 0.0–1.2)
CO2: 23 mmol/L (ref 20–29)
Calcium: 9.4 mg/dL (ref 8.6–10.2)
Chloride: 105 mmol/L (ref 96–106)
Creatinine, Ser: 1.37 mg/dL — ABNORMAL HIGH (ref 0.76–1.27)
GFR calc Af Amer: 62 mL/min/{1.73_m2} (ref >59)
GFR calc non Af Amer: 54 mL/min/{1.73_m2} — ABNORMAL LOW (ref >59)
Globulin, Total: 3 g/dL (ref 1.5–4.5)
Glucose: 166 mg/dL — ABNORMAL HIGH (ref 65–99)
Potassium: 4.1 mmol/L (ref 3.5–5.2)
Sodium: 143 mmol/L (ref 134–144)
Total Protein: 7.4 g/dL (ref 6.0–8.5)

## 2018-03-05 LAB — PSA: Prostate Specific Ag, Serum: 0.3 ng/mL (ref 0.0–4.0)

## 2018-03-05 LAB — HEMOGLOBIN A1C
Est. average glucose Bld gHb Est-mCnc: 154 mg/dL
Hgb A1c MFr Bld: 7 % — ABNORMAL HIGH (ref 4.8–5.6)

## 2018-03-09 MED FILL — METOPROLOL SUCCINATE ER 25: 25 | 90 days supply | Qty: 90 | Fill #1

## 2018-03-09 MED FILL — LISINOPRIL 10 MG TABLET: 10 | 90 days supply | Qty: 90 | Fill #1

## 2018-03-17 NOTE — Progress Notes (Signed)
Lab pool: Your A1C level has increased, indicating worsening diabetes. Please improve your diet. Try to eat more vegetables and lean meat, and avoid the sweets and starches (excessive rice, breads, pastas, potatoes, etc.)   Scheduling pool:  Please make a follow-up appointment with your primary care provider, Dr. Clelia Croft, in the next 4-6 weeks to discuss these recent lab results and possible treatment options.

## 2018-03-28 ENCOUNTER — Encounter: Payer: Self-pay | Admitting: Gastroenterology

## 2018-03-30 MED FILL — OMEPRAZOLE 20 MG CAP: 20 | 60 days supply | Qty: 60 | Fill #1

## 2018-04-13 MED FILL — BRILINTA 90 MG TABLET: 90 | 90 days supply | Qty: 180 | Fill #1

## 2018-04-25 ENCOUNTER — Encounter: Payer: Self-pay | Admitting: Gastroenterology

## 2018-04-25 ENCOUNTER — Ambulatory Visit (INDEPENDENT_AMBULATORY_CARE_PROVIDER_SITE_OTHER): Payer: 59 | Admitting: Gastroenterology

## 2018-04-25 VITALS — BP 138/68 | HR 76 | Ht 70.0 in | Wt 235.0 lb

## 2018-04-25 DIAGNOSIS — Z7902 Long term (current) use of antithrombotics/antiplatelets: Secondary | ICD-10-CM | POA: Insufficient documentation

## 2018-04-25 DIAGNOSIS — Z1211 Encounter for screening for malignant neoplasm of colon: Secondary | ICD-10-CM

## 2018-04-25 NOTE — Patient Instructions (Signed)
You will be due for a recall colonoscopy in 01/2019. We will send you a reminder in the mail when it gets closer to that time.

## 2018-04-25 NOTE — Progress Notes (Addendum)
04/25/2018 [name] [id number] 1951/07/28   HISTORY OF PRESENT ILLNESS:  This is a 67 year old male who is a patient of Dr. Leone Payor.  His last was in 2008 and only had internal hemorrhoids.  Is on Brilinta for a DES that was placed in May.  He does not have any GI concerns.   Past Medical History:  Diagnosis Date  . Arthritis   . Asthma    occ bronchitis  . Bronchitis   . COPD (chronic obstructive pulmonary disease) ([facility])   . Coronary artery disease    a. s/p multiple prior PCIs; b. s/p CABG 06/2006 (L-LAD, S-Int and OM, S-PDA);  c. ETT-Myoview 4/13: low risk, no ischemia, EF 53%  . GERD (gastroesophageal reflux disease)   . Hx of echocardiogram    Echo 10/2006: normal LVF  . Hyperlipidemia   . Hypertension   . Myocardial infarction ([facility])   . Peripheral vascular disease ([facility])   . Seasonal allergies   . Wears dentures   . Wears glasses    Past Surgical History:  Procedure Laterality Date  . ABDOMINAL AORTOGRAM N/A 04/28/2017   Procedure: ABDOMINAL AORTOGRAM;  Surgeon: Iran Ouch, MD;  [facility]: [facility];  Service: Cardiovascular;  Laterality: N/A;  . ACNE CYST REMOVAL    . CARDIAC CATHETERIZATION    . COLONOSCOPY    . CORONARY ARTERY BYPASS GRAFT  2007  . CORONARY STENT INTERVENTION  01/05/2018  . CORONARY STENT INTERVENTION N/A 01/05/2018   Procedure: CORONARY STENT INTERVENTION;  Surgeon: Lennette Bihari, MD;  Location: [facility];  Service: Cardiovascular;  Laterality: N/A;  . CORONARY STENT PLACEMENT     x5 prior to cabg  . FEMORAL-POPLITEAL BYPASS GRAFT Right 06/28/2017   Procedure: BYPASS GRAFT COMMON FEMORAL- ABOVE KNEE POPLITEAL ARTERY WITH LEFT GREATER SAPHENOUS VEIN;  Surgeon: Fransisco Hertz, MD;  Location: [facility];  Service: Vascular;  Laterality: Right;  . KNEE ARTHROSCOPY     rt knee  . LEFT HEART CATH AND CORS/GRAFTS ANGIOGRAPHY N/A 01/05/2018   Procedure: LEFT HEART CATH AND CORS/GRAFTS ANGIOGRAPHY;  Surgeon: Lennette Bihari,  MD;  Location: [facility];  Service: Cardiovascular;  Laterality: N/A;  . LOWER EXTREMITY ANGIOGRAPHY Bilateral 04/28/2017   Procedure: Lower Extremity Angiography;  Surgeon: Iran Ouch, MD;  Location: [facility];  Service: Cardiovascular;  Laterality: Bilateral;  . MULTIPLE TOOTH EXTRACTIONS    . SHOULDER ARTHROSCOPY  12/14/2011   removed bone spurs and repositioned muscle.   . TONSILLECTOMY      reports that he quit smoking about 13 months ago. His smoking use included cigarettes. He smoked 1.00 pack per day. He has never used smokeless tobacco. He reports that he drinks alcohol. He reports that he does not use drugs. family history includes Coronary artery disease in his father; Diabetes in his mother; Heart disease in his brother, father, sister, and sister. Allergies  Allergen Reactions  . Codeine Nausea And Vomiting      Outpatient Encounter Medications as of 04/25/2018  Medication Sig  . albuterol (PROVENTIL HFA;VENTOLIN HFA) 108 (90 Base) MCG/ACT inhaler Inhale 2 puffs into the lungs every 4 (four) hours as needed for wheezing or shortness of breath (cough, shortness of breath or wheezing.).  Marland Kitchen amLODipine (NORVASC) 10 MG tablet Take 1 tablet (10 mg total) by mouth daily.  Marland Kitchen aspirin EC 81 MG tablet Take 81 mg by mouth daily.  . cetirizine (ZYRTEC) 10 MG  tablet Take 10 mg by mouth daily.   . diphenhydramine-acetaminophen (TYLENOL PM) 25-500 MG TABS tablet Take 2 tablets by mouth at bedtime.  Marland Kitchen lisinopril (PRINIVIL,ZESTRIL) 20 MG tablet Take 1 tablet (20 mg total) by mouth daily.  . metoprolol succinate (TOPROL-XL) 25 MG 24 hr tablet Take 1 tablet (25 mg total) by mouth daily. Take with or immediately following a meal.  . Multiple Vitamins-Minerals (MULTIVITAMIN ADULTS PO) Take 1 tablet by mouth daily. VitaFusion  . nitroGLYCERIN (NITROSTAT) 0.4 MG SL tablet Place 1 tablet (0.4 mg total) under the tongue every 5 (five) minutes as needed for chest pain.  Marland Kitchen omeprazole  (PRILOSEC) 20 MG capsule Take 1 capsule (20 mg total) by mouth daily.  . ticagrelor (BRILINTA) 90 MG TABS tablet Take 1 tablet (90 mg total) by mouth 2 (two) times daily.  . [DISCONTINUED] rosuvastatin (CRESTOR) 20 MG tablet Take 1 tablet (20 mg total) by mouth daily.   No facility-administered encounter medications on file as of 04/25/2018.      REVIEW OF SYSTEMS  : All other systems reviewed and negative except where noted in the History of Present Illness.   PHYSICAL EXAM: BP 138/68   Pulse 76   Ht 5\' 10"  (1.778 m)   Wt 235 lb (106.6 kg)   BMI 33.72 kg/m  General: Well developed white male in no acute distress Head: Normocephalic and atraumatic Eyes:  Sclerae anicteric, conjunctiva pink. Ears: Normal auditory acuity Musculoskeletal: Symmetrical with no gross deformities  Neurological: Alert oriented x 4, grossly non-focal Psychological:  Alert and cooperative. Normal mood and affect  ASSESSMENT AND PLAN: *Screening colonoscopy:  Last was in 2008 and only had internal hemorrhoids.  Is on Brilinta for a DES that was placed in May.  I do not think that cardiology will allow [place] to hold Brilinta until a year after stent placement.  He does not have any GI concerns.  Will place him in for colonoscopy recall for 01/2019.   CC:  McVey, Garner Gavel*  Agree with Ms. Rise Mu management.  Iva Boop, MD, Clementeen Graham

## 2018-05-02 MED FILL — AMLODIPINE BESYLATE 10 MG T: 10 | 90 days supply | Qty: 90 | Fill #0

## 2018-05-09 ENCOUNTER — Ambulatory Visit (HOSPITAL_COMMUNITY)
Admission: EM | Admit: 2018-05-09 | Discharge: 2018-05-09 | Disposition: A | Discharge status: Home / Self Care | Payer: 59

## 2018-05-09 ENCOUNTER — Encounter (HOSPITAL_COMMUNITY): Payer: Self-pay

## 2018-05-09 DIAGNOSIS — W298XXA Contact with other powered powered hand tools and household machinery, initial encounter: Secondary | ICD-10-CM

## 2018-05-09 DIAGNOSIS — S60511A Abrasion of right hand, initial encounter: Secondary | ICD-10-CM | POA: Diagnosis not present

## 2018-05-09 DIAGNOSIS — S61411A Laceration without foreign body of right hand, initial encounter: Secondary | ICD-10-CM

## 2018-05-09 DIAGNOSIS — T148XXA Other injury of unspecified body region, initial encounter: Secondary | ICD-10-CM

## 2018-05-09 NOTE — Discharge Instructions (Addendum)
Please leave the bandage on for at least 24-36 hours.  Come back if the wound is giving you any trouble.

## 2018-05-09 NOTE — ED Triage Notes (Signed)
Pt presents with hand laceration from a drill.  (pt is on blood thinners) Pt is up to date on tetanus

## 2018-05-09 NOTE — ED Provider Notes (Signed)
05/09/2018 5:06 PM   DOB: 1951/02/21 / MRN: [id number]  SUBJECTIVE:  [name] is a 67 y.o. male presenting for posterior right hand laceration.  No loss of movement or dexterity. Denies weakness.  Has tried placing a bandage.   He is allergic to codeine.   He  has a past medical history of Arthritis, Asthma, Bronchitis, COPD (chronic obstructive pulmonary disease) (HCC), Coronary artery disease, GERD (gastroesophageal reflux disease), echocardiogram, Hyperlipidemia, Hypertension, Myocardial infarction [facility]), Peripheral vascular disease (HCC), Seasonal allergies, Wears dentures, and Wears glasses.    He  reports that he quit smoking about 13 months ago. His smoking use included cigarettes. He smoked 1.00 pack per day. He has never used smokeless tobacco. He reports that he drinks alcohol. He reports that he does not use drugs. He  has no sexual activity history on file. The patient  has a past surgical history that includes Coronary artery bypass graft (2007); Coronary stent placement; Tonsillectomy; Colonoscopy; Cardiac catheterization; Knee arthroscopy; Acne cyst removal; Shoulder arthroscopy (12/14/2011); Lower Extremity Angiography (Bilateral, 04/28/2017); ABDOMINAL AORTOGRAM (N/A, 04/28/2017); Multiple tooth extractions; Femoral-popliteal Bypass Graft (Right, 06/28/2017); CORONARY STENT INTERVENTION (01/05/2018); LEFT HEART CATH AND CORS/GRAFTS ANGIOGRAPHY (N/A, 01/05/2018); and CORONARY STENT INTERVENTION (N/A, 01/05/2018).  His family history includes Coronary artery disease in his father; Diabetes in his mother; Heart disease in his brother, father, sister, and sister.  Review of Systems  Neurological: Negative for focal weakness and weakness.    OBJECTIVE:  BP (!) 144/71 (BP Location: Right Arm)   Pulse 69   Temp 98.9 F (37.2 C) (Oral)   Resp 20   SpO2 98%   Wt Readings from Last 3 Encounters:  04/25/18 235 lb (106.6 kg)  03/04/18 236 lb 3.2 oz (107.1 kg)  02/16/18 235 lb (106.6 kg)    Temp Readings from Last 3 Encounters:  05/09/18 98.9 F (37.2 C) (Oral)  03/04/18 97.7 F (36.5 C) (Oral)  02/16/18 (!) 97.2 F (36.2 C)   BP Readings from Last 3 Encounters:  05/09/18 (!) 144/71  04/25/18 138/68  03/04/18 134/64   Pulse Readings from Last 3 Encounters:  05/09/18 69  04/25/18 76  03/04/18 69    Physical Exam  Constitutional: He is oriented to person, place, and time. He appears well-developed. He does not appear ill.  Eyes: Pupils are equal, round, and reactive to light. Conjunctivae and EOM are normal.  Cardiovascular: Normal rate.  Pulmonary/Chest: Effort normal.  Abdominal: He exhibits no distension.  Musculoskeletal: Normal range of motion.       Hands: Neurological: He is alert and oriented to person, place, and time. No cranial nerve deficit. Coordination normal.  Skin: Skin is warm and dry. He is not diaphoretic.  Psychiatric: He has a normal mood and affect.  Nursing note and vitals reviewed.   No results found for this or any previous visit (from the past 72 hour(s)).  No results found.  ASSESSMENT AND PLAN:   Laceration of right hand without foreign body, initial encounter: Hand washed and topical adhesive applied with good approximation.  Bandage placed.   Abrasion of skin  Discharge Instructions   None        The patient is advised to call or return to clinic if he does not see an improvement in symptoms, or to seek the care of the closest emergency department if he worsens with the above plan.   Deliah Boston, MHS, PA-C 05/09/2018 5:06 PM   Ofilia Neas, PA-C 05/09/18 1723

## 2018-05-11 ENCOUNTER — Ambulatory Visit: Payer: 59 | Admitting: Vascular Surgery

## 2018-05-11 ENCOUNTER — Encounter (HOSPITAL_COMMUNITY): Payer: 59

## 2018-05-26 ENCOUNTER — Other Ambulatory Visit: Payer: Self-pay

## 2018-05-26 ENCOUNTER — Inpatient Hospital Stay (HOSPITAL_COMMUNITY)
Admission: EM | Admit: 2018-05-26 | Discharge: 2018-05-28 | DRG: 287 | Disposition: A | Discharge status: Home / Self Care | Payer: 59 | Attending: Cardiology | Admitting: Cardiology

## 2018-05-26 ENCOUNTER — Emergency Department (HOSPITAL_COMMUNITY): Payer: 59

## 2018-05-26 ENCOUNTER — Telehealth: Payer: Self-pay | Admitting: Cardiology

## 2018-05-26 ENCOUNTER — Encounter (HOSPITAL_COMMUNITY): Payer: Self-pay

## 2018-05-26 DIAGNOSIS — Z951 Presence of aortocoronary bypass graft: Secondary | ICD-10-CM

## 2018-05-26 DIAGNOSIS — Z955 Presence of coronary angioplasty implant and graft: Secondary | ICD-10-CM

## 2018-05-26 DIAGNOSIS — R7303 Prediabetes: Secondary | ICD-10-CM | POA: Diagnosis not present

## 2018-05-26 DIAGNOSIS — Z7902 Long term (current) use of antithrombotics/antiplatelets: Secondary | ICD-10-CM

## 2018-05-26 DIAGNOSIS — I10 Essential (primary) hypertension: Secondary | ICD-10-CM | POA: Diagnosis not present

## 2018-05-26 DIAGNOSIS — K219 Gastro-esophageal reflux disease without esophagitis: Secondary | ICD-10-CM | POA: Diagnosis present

## 2018-05-26 DIAGNOSIS — I25119 Atherosclerotic heart disease of native coronary artery with unspecified angina pectoris: Secondary | ICD-10-CM | POA: Diagnosis not present

## 2018-05-26 DIAGNOSIS — Z7982 Long term (current) use of aspirin: Secondary | ICD-10-CM

## 2018-05-26 DIAGNOSIS — E785 Hyperlipidemia, unspecified: Secondary | ICD-10-CM | POA: Diagnosis present

## 2018-05-26 DIAGNOSIS — I714 Abdominal aortic aneurysm, without rupture: Secondary | ICD-10-CM | POA: Diagnosis present

## 2018-05-26 DIAGNOSIS — I200 Unstable angina: Secondary | ICD-10-CM | POA: Diagnosis not present

## 2018-05-26 DIAGNOSIS — I252 Old myocardial infarction: Secondary | ICD-10-CM

## 2018-05-26 DIAGNOSIS — Z885 Allergy status to narcotic agent status: Secondary | ICD-10-CM

## 2018-05-26 DIAGNOSIS — R079 Chest pain, unspecified: Secondary | ICD-10-CM | POA: Diagnosis not present

## 2018-05-26 DIAGNOSIS — I25110 Atherosclerotic heart disease of native coronary artery with unstable angina pectoris: Principal | ICD-10-CM | POA: Diagnosis present

## 2018-05-26 DIAGNOSIS — Z87891 Personal history of nicotine dependence: Secondary | ICD-10-CM | POA: Diagnosis not present

## 2018-05-26 DIAGNOSIS — Z7951 Long term (current) use of inhaled steroids: Secondary | ICD-10-CM

## 2018-05-26 DIAGNOSIS — I739 Peripheral vascular disease, unspecified: Secondary | ICD-10-CM | POA: Diagnosis present

## 2018-05-26 DIAGNOSIS — J449 Chronic obstructive pulmonary disease, unspecified: Secondary | ICD-10-CM | POA: Diagnosis present

## 2018-05-26 DIAGNOSIS — Z79899 Other long term (current) drug therapy: Secondary | ICD-10-CM

## 2018-05-26 DIAGNOSIS — I5030 Unspecified diastolic (congestive) heart failure: Secondary | ICD-10-CM | POA: Diagnosis not present

## 2018-05-26 LAB — CBC
HCT: 39.5 % (ref 39.0–52.0)
HCT: 39.2 % (ref 39.0–52.0)
HEMOGLOBIN: 12.9 g/dL — AB (ref 13.0–17.0)
Hemoglobin: 12.7 g/dL — ABNORMAL LOW (ref 13.0–17.0)
MCH: 29.6 pg (ref 26.0–34.0)
MCH: 29.4 pg (ref 26.0–34.0)
MCHC: 32.7 g/dL (ref 30.0–36.0)
MCHC: 32.4 g/dL (ref 30.0–36.0)
MCV: 90.6 fL (ref 78.0–100.0)
MCV: 90.7 fL (ref 78.0–100.0)
Platelets: 209 10*3/uL (ref 150–400)
Platelets: 201 10*3/uL (ref 150–400)
RBC: 4.36 MIL/uL (ref 4.22–5.81)
RBC: 4.32 MIL/uL (ref 4.22–5.81)
RDW: 13.2 % (ref 11.5–15.5)
RDW: 13.2 % (ref 11.5–15.5)
WBC: 6.9 10*3/uL (ref 4.0–10.5)
WBC: 6.6 10*3/uL (ref 4.0–10.5)

## 2018-05-26 LAB — CREATININE, SERUM: Creatinine, Ser: 1 mg/dL (ref 0.61–1.24)

## 2018-05-26 LAB — I-STAT TROPONIN, ED
TROPONIN I, POC: 0 ng/mL (ref 0.00–0.08)
Troponin i, poc: 0 ng/mL (ref 0.00–0.08)

## 2018-05-26 LAB — BASIC METABOLIC PANEL
ANION GAP: 12 (ref 5–15)
BUN: 17 mg/dL (ref 8–23)
CHLORIDE: 104 mmol/L (ref 98–111)
CO2: 23 mmol/L (ref 22–32)
Calcium: 9.5 mg/dL (ref 8.9–10.3)
Creatinine, Ser: 1.08 mg/dL (ref 0.61–1.24)
GFR calc Af Amer: >60 mL/min (ref >60)
GFR calc non Af Amer: >60 mL/min (ref >60)
GLUCOSE: 146 mg/dL — AB (ref 70–99)
Potassium: 3.6 mmol/L (ref 3.5–5.1)
Sodium: 139 mmol/L (ref 135–145)

## 2018-05-26 MED ORDER — PANTOPRAZOLE SODIUM 40 MG PO TBEC
40.0000 mg | DELAYED_RELEASE_TABLET | Freq: Every day | ORAL | Status: DC
  Administered 2018-05-27 – 2018-05-28 (×2): 40 mg via ORAL
  Filled 2018-05-26 (×2): qty 1

## 2018-05-26 MED ORDER — METOPROLOL SUCCINATE ER 25 MG PO TB24
25.0000 mg | ORAL_TABLET | Freq: Every day | ORAL | Status: DC
  Administered 2018-05-27 – 2018-05-28 (×2): 25 mg via ORAL
  Filled 2018-05-26 (×2): qty 1

## 2018-05-26 MED ORDER — ASPIRIN EC 81 MG PO TBEC
81.0000 mg | DELAYED_RELEASE_TABLET | Freq: Every day | ORAL | Status: DC
  Administered 2018-05-27: 81 mg via ORAL
  Filled 2018-05-26: qty 1

## 2018-05-26 MED ORDER — ACETAMINOPHEN 325 MG PO TABS: 650.0000 mg | ORAL_TABLET | ORAL | Status: DC | PRN

## 2018-05-26 MED ORDER — SODIUM CHLORIDE 0.9% FLUSH: 3.0000 mL | Freq: Two times a day (BID) | INTRAVENOUS | Status: DC

## 2018-05-26 MED ORDER — SODIUM CHLORIDE 0.9 % WEIGHT BASED INFUSION
3.0000 mL/kg/h | INTRAVENOUS | Status: DC
  Administered 2018-05-27: 3 mL/kg/h via INTRAVENOUS

## 2018-05-26 MED ORDER — LORATADINE 10 MG PO TABS
10.0000 mg | ORAL_TABLET | Freq: Every day | ORAL | Status: DC
  Administered 2018-05-28: 10 mg via ORAL
  Filled 2018-05-26 (×2): qty 1

## 2018-05-26 MED ORDER — SODIUM CHLORIDE 0.9% FLUSH: 3.0000 mL | INTRAVENOUS | Status: DC | PRN

## 2018-05-26 MED ORDER — ASPIRIN EC 81 MG PO TBEC: 81.0000 mg | DELAYED_RELEASE_TABLET | Freq: Every day | ORAL | Status: DC

## 2018-05-26 MED ORDER — TICAGRELOR 90 MG PO TABS
90.0000 mg | ORAL_TABLET | Freq: Two times a day (BID) | ORAL | Status: DC
  Administered 2018-05-27: 90 mg via ORAL
  Filled 2018-05-26 (×2): qty 1

## 2018-05-26 MED ORDER — DIPHENHYDRAMINE HCL 25 MG PO CAPS: 50.0000 mg | ORAL_CAPSULE | Freq: Every evening | ORAL | Status: DC | PRN

## 2018-05-26 MED ORDER — SODIUM CHLORIDE 0.9 % IV SOLN: 250.0000 mL | INTRAVENOUS | Status: DC | PRN

## 2018-05-26 MED ORDER — ROSUVASTATIN CALCIUM 20 MG PO TABS
20.0000 mg | ORAL_TABLET | Freq: Every day | ORAL | Status: DC
  Filled 2018-05-26: qty 1

## 2018-05-26 MED ORDER — NITROGLYCERIN 0.4 MG SL SUBL: 0.4000 mg | SUBLINGUAL_TABLET | SUBLINGUAL | Status: DC | PRN

## 2018-05-26 MED ORDER — ONDANSETRON HCL 4 MG/2ML IJ SOLN
4.0000 mg | Freq: Four times a day (QID) | INTRAMUSCULAR | Status: DC | PRN
  Administered 2018-05-27: 4 mg via INTRAVENOUS

## 2018-05-26 MED ORDER — ENOXAPARIN SODIUM 40 MG/0.4ML ~~LOC~~ SOLN
40.0000 mg | SUBCUTANEOUS | Status: DC
  Filled 2018-05-26: qty 0.4

## 2018-05-26 MED ORDER — AMLODIPINE BESYLATE 10 MG PO TABS
10.0000 mg | ORAL_TABLET | Freq: Every day | ORAL | Status: DC
  Administered 2018-05-27 – 2018-05-28 (×2): 10 mg via ORAL
  Filled 2018-05-26 (×2): qty 1

## 2018-05-26 MED ORDER — DIPHENHYDRAMINE-APAP (SLEEP) 25-500 MG PO TABS: 2.0000 | ORAL_TABLET | Freq: Every evening | ORAL | Status: DC | PRN

## 2018-05-26 MED ORDER — SODIUM CHLORIDE 0.9 % WEIGHT BASED INFUSION
1.0000 mL/kg/h | INTRAVENOUS | Status: DC
  Administered 2018-05-27: 1 mL/kg/h via INTRAVENOUS

## 2018-05-26 MED ORDER — ACETAMINOPHEN 500 MG PO TABS: 1000.0000 mg | ORAL_TABLET | Freq: Every evening | ORAL | Status: DC | PRN

## 2018-05-26 MED ORDER — ALBUTEROL SULFATE (2.5 MG/3ML) 0.083% IN NEBU: 3.0000 mL | INHALATION_SOLUTION | RESPIRATORY_TRACT | Status: DC | PRN

## 2018-05-26 MED FILL — NITROGLYCERIN 0.4 MG TAB SL: 0.4 | 10 days supply | Qty: 25 | Fill #1

## 2018-05-26 NOTE — ED Provider Notes (Signed)
[facility] EMERGENCY DEPARTMENT Provider Note   CSN: [id number] Arrival date & time: 05/26/18  1255     History   Chief Complaint Chief Complaint  Patient presents with  . Chest Pain    HPI [name] is a 67 y.o. male who presents the emergency department for evaluation of chest pain.   the patient has an extensive history of multivessel native coronary artery disease.  He also has peripheral arterial disease, hyperlipidemia.  He is a smoker.   His most recent cath placement was in 01/05/2018 by Dr. Tresa Endo.  He is followed by Dr. [name].  After stent was placed in a previous vein graft supplying the OM1 vessel he still has significant stenotic coronary artery disease that is being managed medically.  Patient states that over the past week he has had several episodes of retrosternal chest pain which he describes as pressure-like and squeezing with associated sensation of heartburn.  He states that he has never had to take nitroglycerin before but has taken it each time this has occurred with complete resolution of his symptoms.  He denies exertional anginal symptoms.  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  Yesterday he had the same symptoms and tried Tums and had no improvement.  He denies nausea, diaphoresis.  He has no current chest pain.  He takes Brilinta and a baby aspirin daily.  He denies fever, chills, cough. HPI  Past Medical History:  Diagnosis Date  . Arthritis   . Asthma    occ bronchitis  . Bronchitis   . COPD (chronic obstructive pulmonary disease) (HCC)   . Coronary artery disease    a. s/p multiple prior PCIs; b. s/p CABG 06/2006 (L-LAD, S-Int and OM, S-PDA);  c. ETT-Myoview 4/13: low risk, no ischemia, EF 53%  . GERD (gastroesophageal reflux disease)   . Hx of echocardiogram    Echo 10/2006: normal LVF  . Hyperlipidemia   . Hypertension   . Myocardial  infarction (HCC)   . Peripheral vascular disease (HCC)   . Seasonal allergies   . Wears dentures   . Wears glasses     Patient Active Problem List   Diagnosis Date Noted  . Antiplatelet or antithrombotic long-term use 04/25/2018  . Abnormal stress test 01/06/2018  . Unstable angina (HCC)   . Status post bypass graft of extremity 10/20/2017  . Atherosclerosis of native artery of both lower extremities with intermittent claudication (HCC) 06/18/2017  . Peripheral arterial occlusive disease (HCC) 04/09/2017  . Bruit 10/23/2015  . Prediabetes 12/19/2014  . GERD (gastroesophageal reflux disease) 12/19/2014  . CLAUDICATION 09/04/2010  . Hyperlipidemia LDL goal <70 08/12/2009  . Essential hypertension 08/12/2009  . Acute myocardial infarction (HCC) 08/12/2009  . Coronary atherosclerosis 08/12/2009  . ISCHEMIC CARDIOMYOPATHY 08/12/2009  . 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 SYNCOPE 08/12/2009  . Insomnia 08/12/2009    Past Surgical History:  Procedure Laterality Date  . ABDOMINAL AORTOGRAM N/A 04/28/2017   Procedure: ABDOMINAL AORTOGRAM;  Surgeon: Iran Ouch, MD;  Location: [facility] INVASIVE CV LAB;  Service: Cardiovascular;  Laterality: N/A;  . ACNE CYST REMOVAL    . CARDIAC CATHETERIZATION    . COLONOSCOPY    . CORONARY ARTERY BYPASS GRAFT  2007  . CORONARY STENT INTERVENTION  01/05/2018  . CORONARY STENT INTERVENTION N/A 01/05/2018   Procedure: CORONARY STENT INTERVENTION;  Surgeon: Lennette Bihari, MD;  Location: [facility]  INVASIVE CV LAB;  Service: Cardiovascular;  Laterality: N/A;  . CORONARY STENT PLACEMENT     x5 prior to cabg  . FEMORAL-POPLITEAL BYPASS GRAFT Right 06/28/2017   Procedure: BYPASS GRAFT COMMON FEMORAL- ABOVE KNEE POPLITEAL ARTERY WITH LEFT GREATER SAPHENOUS VEIN;  Surgeon: Fransisco Hertz, MD;  [facility];  Service: Vascular;  Laterality: Right;  . KNEE ARTHROSCOPY     rt knee  . LEFT HEART CATH AND CORS/GRAFTS ANGIOGRAPHY N/A 01/05/2018   Procedure: LEFT HEART CATH  AND CORS/GRAFTS ANGIOGRAPHY;  Surgeon: Lennette Bihari, MD;  Location: [facility] INVASIVE CV LAB;  Service: Cardiovascular;  Laterality: N/A;  . LOWER EXTREMITY ANGIOGRAPHY Bilateral 04/28/2017   Procedure: Lower Extremity Angiography;  Surgeon: Iran Ouch, MD;  Location: [facility] INVASIVE CV LAB;  Service: Cardiovascular;  Laterality: Bilateral;  . MULTIPLE TOOTH EXTRACTIONS    . SHOULDER ARTHROSCOPY  12/14/2011   removed bone spurs and repositioned muscle.   . TONSILLECTOMY          Home Medications    Prior to Admission medications   Medication Sig Start Date End Date Taking? Authorizing Provider  albuterol (PROVENTIL HFA;VENTOLIN HFA) 108 (90 Base) MCG/ACT inhaler Inhale 2 puffs into the lungs every 4 (four) hours as needed for wheezing or shortness of breath (cough, shortness of breath or wheezing.). 11/02/16  Yes Sherren Mocha, MD  amLODipine (NORVASC) 10 MG tablet Take 1 tablet (10 mg total) by mouth daily. 12/15/17  Yes Jodelle Gross, NP  aspirin EC 81 MG tablet Take 81 mg by mouth daily.   Yes [provider]  cetirizine (ZYRTEC) 10 MG tablet Take 10 mg by mouth daily.    Yes [provider]  diphenhydramine-acetaminophen (TYLENOL PM) 25-500 MG TABS tablet Take 2 tablets by mouth at bedtime.   Yes [provider]  lisinopril (PRINIVIL,ZESTRIL) 10 MG tablet Take 10 mg by mouth daily. 03/09/18  Yes [provider]  metoprolol succinate (TOPROL-XL) 25 MG 24 hr tablet Take 1 tablet (25 mg total) by mouth daily. Take with or immediately following a meal. 12/15/17  Yes Jodelle Gross, NP  nitroGLYCERIN (NITROSTAT) 0.4 MG SL tablet Place 1 tablet (0.4 mg total) under the tongue every 5 (five) minutes as needed for chest pain. 12/15/17  Yes Jodelle Gross, NP  omeprazole (PRILOSEC) 20 MG capsule Take 1 capsule (20 mg total) by mouth daily. 03/04/18  Yes McVey, Madelaine Bhat, PA-C  ticagrelor (BRILINTA) 90 MG TABS tablet Take 1 tablet (90 mg total) by  mouth 2 (two) times daily. 01/06/18  Yes Azalee Course, PA    Family History Family History  Problem Relation Age of Onset  . Coronary artery disease Father        brothers, sisters  . Heart disease Father   . Diabetes Mother   . Heart disease Sister   . Heart disease Sister   . Heart disease Brother   . Anesthesia problems Neg Hx     Social History Social History   Tobacco Use  . Smoking status: Former Smoker    Packs/day: 1.00    Types: Cigarettes    Last attempt to quit: 03/22/2017    Years since quitting: 1.1  . Smokeless tobacco: Never Used  Substance Use Topics  . Alcohol use: Yes    Alcohol/week: 0.0 standard drinks    Comment: occasional - BEER OR WINE  . Drug use: No     Allergies   Neosporin [neomycin-bacitracin zn-polymyx] and Codeine  Review of Systems Review of Systems  Ten systems reviewed and are negative for acute change, except as noted in the HPI.   Physical Exam Updated Vital Signs BP (!) 158/76   Pulse 60   Temp 97.7 F (36.5 C) (Oral)   Resp 20   Ht 5\' 10"  (1.778 m)   Wt 99.8 kg   SpO2 99%   BMI 31.57 kg/m   Physical Exam  Constitutional: He appears well-developed and well-nourished. No distress.  HENT:  Head: Normocephalic and atraumatic.  Eyes: Pupils are equal, round, and reactive to light. Conjunctivae and EOM are normal. No scleral icterus.  Neck: Normal range of motion. Neck supple.  Cardiovascular: Normal rate, regular rhythm and normal heart sounds.  Pulmonary/Chest: Effort normal and breath sounds normal. No respiratory distress.  Abdominal: Soft. There is no tenderness.  Musculoskeletal: He exhibits no edema.  Neurological: He is alert.  Skin: Skin is warm and dry. He is not diaphoretic.  Psychiatric: His behavior is normal.  Nursing note and vitals reviewed.    ED Treatments / Results  Labs (all labs ordered are listed, but only abnormal results are displayed) Labs Reviewed  BASIC METABOLIC PANEL - Abnormal;  Notable for the following components:      Result Value   Glucose, Bld 146 (*)    All other components within normal limits  CBC - Abnormal; Notable for the following components:   Hemoglobin 12.9 (*)    All other components within normal limits  I-STAT TROPONIN, ED  I-STAT TROPONIN, ED    EKG EKG Interpretation  Date/Time:  Thursday May 26 2018 12:57:19 EDT Ventricular Rate:  72 PR Interval:  140 QRS Duration: 108 QT Interval:  358 QTC Calculation: 392 R Axis:   70 Text Interpretation:  Normal sinus rhythm Nonspecific T wave abnormality Abnormal ECG No significant change since last tracing Confirmed by Richardean Canal [phone]) on 05/26/2018 4:24:51 PM   Radiology Dg Chest 2 View  Result Date: 05/26/2018 CLINICAL DATA:  Midsternal chest pain. EXAM: CHEST - 2 VIEW COMPARISON:  09/12/2015 FINDINGS: Both lungs are clear. Negative for a pneumothorax. Small focus of sclerosis involving the anterior left third rib appears stable. Heart size is normal. Prior CABG procedure. Sternal wires appear to be intact. No large pleural effusions. Stable round lucency involving the proximal right humerus. IMPRESSION: No active cardiopulmonary disease. Electronically Signed   By: Richarda Overlie M.D.   On: 05/26/2018 13:57    Procedures Procedures (including critical care time)  Medications Ordered in ED Medications - No data to display   Initial Impression / Assessment and Plan / ED Course  I have reviewed the triage vital signs and the nursing notes.  Pertinent labs & imaging results that were available during my care of the patient were reviewed by me and considered in my medical decision making (see chart for details).  Clinical Course as of May 27 36  Thu May 26, 2018  1714 Patient's second troponin negative   Troponin i, poc: 0.00 [AH]    Clinical Course User Index [AH] Arthor Captain, PA-C    4:26 PM BP (!) 158/76   Pulse 60   Temp 97.7 F (36.5 C) (Oral)   Resp 20   Ht 5'  10" (1.778 m)   Wt 99.8 kg   SpO2 99%   BMI 31.57 kg/m  Patient with retrosternal chest pain and pressure.  Relieved by nitroglycerin.  Several episodes this week.. He has known coronary artery disease some  of which they were not able to intervene on.  He does not have any active chest pain at this time so I do not feel he needs to be heparinized.  Any other emergent patient's chest pain aortic dissection, pulmonary embolus, pericarditis or other intrathoracic organ abnormality.  Patient chest x-ray is negative for acute abnormality.  I personally reviewed the PA and lateral films and agree with the radiologic interpretation.  Patient's EKG does not appear significantly abnormal and is unchanged from previous.  His initial troponin is negative and I am getting a repeat at this time.  I have asked cardiology to consult on this patient given his extensive history.  Patient will be admitted to the hospital by the cardiology team.    Final Clinical Impressions(s) / ED Diagnoses   Final diagnoses:  None    ED Discharge Orders    None       Arthor Captain, PA-C 05/27/18 0054    Charlynne Pander, MD 05/28/18 [phone]

## 2018-05-26 NOTE — ED Notes (Signed)
Pt a little labored after ambulating to the bathroom and back to room

## 2018-05-26 NOTE — ED Triage Notes (Signed)
Pt presents with 1 week h/o mid-sternal chest pain.  Pt describes like indigestion, similar to past MI.  Pt reports shortness of breath even while at rest.

## 2018-05-26 NOTE — Telephone Encounter (Signed)
New Message   Pt c/o medication issue:  1. Name of Medication: nitroGLYCERIN (NITROSTAT) 0.4 MG SL tablet  2. How are you currently taking this medication (dosage and times per day)? Place 1 tablet (0.4 mg total) under the tongue every 5 (five) minutes as needed for chest pain.  3. Are you having a reaction (difficulty breathing--STAT)? no  4. What is your medication issue? Pt's wife is calling, states that the pt is concerned because he is having to take a nirto everyday on exertion and he wants to speak with a nurse about it

## 2018-05-26 NOTE — Progress Notes (Signed)
Received to 2V95 via stretcher from ED. Denies CP or SOB. Oriented to room and unit. Placed on bedside monitor and verified with CCMD.

## 2018-05-26 NOTE — H&P (Addendum)
Cardiology Admission History and Physical:   Patient ID: [name] MRN: [id number]; DOB: [birth year]   Admission date: 05/26/2018  Primary Care Provider: Sherren Mocha, MD Primary Cardiologist: Olga Millers, MD  Primary Electrophysiologist:  None   Chief Complaint:  Chest pain  Patient Profile:   [name] is a 67 y.o. male with a PMH of multivessel CAD s/p CABG in 2017 with subsequent PCIs (last intervention 01/2018 with PCI to SVG to OM1), HTN, HLD, PAD, who presents with complaints of exertional chest pain.  History of Present Illness:   [name] was in his usual state of health until 1 week ago when he began experiencing exertional chest pain. 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 He has also had a couple episodes of epigastric discomfort described as indigestion; last episode today. He states when he had his initial MI he presented with indigestion complaints, and remembering this, he took a SL nitro today with relief of symptoms. He denies associated SOB, DOE, N/V, dizziness, lightheadedness, syncope, or diaphoresis with any of his episodes. Patients wife called the office today with concerns of frequent SL nitroglycerin use for exertional chest pain and she was instructed to bring the patient to the ED for further evaluation.   He was last seen outpatient by cardiology, Joni Reining, NP, 01/2018 in follow-up of recent hospitalization for chest pain where he had an abnormal stress test and underwent cardiac catheterization which revealed a 45% mid left main, patent LIMA to LAD, sequential SVG to OM1 and PDA had a 95% ostial eccentric stenosis and occlusion of the sequential limb after the OM 1 vessel, 95% ostial SVG which was treated with a drug-eluting stent, the second half of the sequential SVG to PDA was occluded. He was recommended for life long DAPT with aspirin and brilinta.   At  the time of this evaluation he is chest pain free. Prior to 1 week ago he had not had any chest pain since his last LHC with stent placement in 01/2018. He reported occasional sudden onset SOB which he reported started while still in the hospital after LHC - suspicious for brilinta side effect - which he states has improved over the past several months. He has bendopnea which he attributes to abdominal girth which is unchanged. He denies recent weight gain, orthopnea, PND, or LE edema. He quit smoking ~1 year ago after trying hypnotism.   ED course: bradycardic to the 50s, mildly hypertensive, VSS stable. Labs notable for K 3.6, Cr 1.08 (baseline), Hgb 12.9 (baseline), PLT 209, Trop negative x2. EKG with sinus rhythm with non-specific T wave abnormalities seen on prior; no STE/D. CXR without acute findings. Cardiology asked to evaluate for unstable angina.    Past Medical History:  Diagnosis Date  . Arthritis   . Asthma    occ bronchitis  . Bronchitis   . COPD (chronic obstructive pulmonary disease) (HCC)   . Coronary artery disease    a. s/p multiple prior PCIs; b. s/p CABG 06/2006 (L-LAD, S-Int and OM, S-PDA);  c. ETT-Myoview 4/13: low risk, no ischemia, EF 53%  . GERD (gastroesophageal reflux disease)   . Hx of echocardiogram    Echo 10/2006: normal LVF  . Hyperlipidemia   . Hypertension   . Myocardial infarction (HCC)   . Peripheral vascular disease (HCC)   . Seasonal allergies   . Wears dentures   .  Wears glasses     Past Surgical History:  Procedure Laterality Date  . ABDOMINAL AORTOGRAM N/A 04/28/2017   Procedure: ABDOMINAL AORTOGRAM;  Surgeon: Iran Ouch, MD;  Location: [facility];  Service: Cardiovascular;  Laterality: N/A;  . ACNE CYST REMOVAL    . CARDIAC CATHETERIZATION    . COLONOSCOPY    . CORONARY ARTERY BYPASS GRAFT  2007  . CORONARY STENT INTERVENTION  01/05/2018  . CORONARY STENT INTERVENTION N/A 01/05/2018   Procedure: CORONARY STENT INTERVENTION;   Surgeon: Lennette Bihari, MD;  Location: [facility];  Service: Cardiovascular;  Laterality: N/A;  . CORONARY STENT PLACEMENT     x5 prior to cabg  . FEMORAL-POPLITEAL BYPASS GRAFT Right 06/28/2017   Procedure: BYPASS GRAFT COMMON FEMORAL- ABOVE KNEE POPLITEAL ARTERY WITH LEFT GREATER SAPHENOUS VEIN;  Surgeon: Fransisco Hertz, MD;  Location: [facility] OR;  Service: Vascular;  Laterality: Right;  . KNEE ARTHROSCOPY     rt knee  . LEFT HEART CATH AND CORS/GRAFTS ANGIOGRAPHY N/A 01/05/2018   Procedure: LEFT HEART CATH AND CORS/GRAFTS ANGIOGRAPHY;  Surgeon: Lennette Bihari, MD;  Location: [facility];  Service: Cardiovascular;  Laterality: N/A;  . LOWER EXTREMITY ANGIOGRAPHY Bilateral 04/28/2017   Procedure: Lower Extremity Angiography;  Surgeon: Iran Ouch, MD;  Location: [facility];  Service: Cardiovascular;  Laterality: Bilateral;  . MULTIPLE TOOTH EXTRACTIONS    . SHOULDER ARTHROSCOPY  12/14/2011   removed bone spurs and repositioned muscle.   . TONSILLECTOMY       Medications Prior to Admission: Prior to Admission medications   Medication Sig Start Date End Date Taking? Authorizing Provider  albuterol (PROVENTIL HFA;VENTOLIN HFA) 108 (90 Base) MCG/ACT inhaler Inhale 2 puffs into the lungs every 4 (four) hours as needed for wheezing or shortness of breath (cough, shortness of breath or wheezing.). 11/02/16  Yes Sherren Mocha, MD  amLODipine (NORVASC) 10 MG tablet Take 1 tablet (10 mg total) by mouth daily. 12/15/17  Yes Jodelle Gross, NP  aspirin EC 81 MG tablet Take 81 mg by mouth daily.   Yes [provider]  cetirizine (ZYRTEC) 10 MG tablet Take 10 mg by mouth daily.    Yes [provider]  diphenhydramine-acetaminophen (TYLENOL PM) 25-500 MG TABS tablet Take 2 tablets by mouth at bedtime.   Yes [provider]  lisinopril (PRINIVIL,ZESTRIL) 10 MG tablet Take 10 mg by mouth daily. 03/09/18  Yes [provider]  metoprolol succinate  (TOPROL-XL) 25 MG 24 hr tablet Take 1 tablet (25 mg total) by mouth daily. Take with or immediately following a meal. 12/15/17  Yes Jodelle Gross, NP  nitroGLYCERIN (NITROSTAT) 0.4 MG SL tablet Place 1 tablet (0.4 mg total) under the tongue every 5 (five) minutes as needed for chest pain. 12/15/17  Yes Jodelle Gross, NP  omeprazole (PRILOSEC) 20 MG capsule Take 1 capsule (20 mg total) by mouth daily. 03/04/18  Yes McVey, Madelaine Bhat, PA-C  ticagrelor (BRILINTA) 90 MG TABS tablet Take 1 tablet (90 mg total) by mouth 2 (two) times daily. 01/06/18  Yes Azalee Course, PA     Allergies:    Allergies  Allergen Reactions  . Neosporin [Neomycin-Bacitracin Zn-Polymyx] Dermatitis  . Codeine Nausea And Vomiting    Social History:   Social History   Socioeconomic History  . Marital status: Married    Spouse name: Not on file  . Number of children: 3  . Years of education: Not  on file  . Highest education level: Not on file  Occupational History  . Occupation: RETAIL  Social Needs  . Financial resource strain: Not on file  . Food insecurity:    Worry: Not on file    Inability: Not on file  . Transportation needs:    Medical: Not on file    Non-medical: Not on file  Tobacco Use  . Smoking status: Former Smoker    Packs/day: 1.00    Types: Cigarettes    Last attempt to quit: 03/22/2017    Years since quitting: 1.1  . Smokeless tobacco: Never Used  Substance and Sexual Activity  . Alcohol use: Yes    Alcohol/week: 0.0 standard drinks    Comment: occasional - BEER OR WINE  . Drug use: No  . Sexual activity: Not on file  Lifestyle  . Physical activity:    Days per week: Not on file    Minutes per session: Not on file  . Stress: Not on file  Relationships  . Social connections:    Talks on phone: Not on file    Gets together: Not on file    Attends religious service: Not on file    Active member of club or organization: Not on file    Attends meetings of clubs or  organizations: Not on file    Relationship status: Not on file  . Intimate partner violence:    Fear of current or ex partner: Not on file    Emotionally abused: Not on file    Physically abused: Not on file    Forced sexual activity: Not on file  Other Topics Concern  . 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     Family History:   The patient's family history includes Coronary artery disease in his father; Diabetes in his mother; Heart disease in his brother, father, sister, and sister. There is no history of Anesthesia problems.    ROS:  Please see the history of present illness.  All other ROS reviewed and negative.     Physical Exam/Data:   Vitals:   05/26/18 1301 05/26/18 1516 05/26/18 1621 05/26/18 1622  BP: (!) 153/72 (!) 142/75 (!) 158/76   Pulse: 64 60  60  Resp: 20 16 (!) 21 20  Temp: 97.7 F (36.5 C)     TempSrc: Oral     SpO2: 98% 100%  99%  Weight: 99.8 kg     Height: 5\' 10"  (1.778 m)      No intake or output data in the 24 hours ending 05/26/18 1625 Filed Weights   05/26/18 1301  Weight: 99.8 kg   Body mass index is 31.57 kg/m.  General:  Well nourished, well developed, sitting upright in bed in no acute distress HEENT: sclera anicteric Neck: no JVD Endocrine:  No thryomegaly Vascular: No carotid bruits; distal pulses 2+ bilaterally Cardiac:  normal S1, S2; RRR; no murmurs, rubs, or gallops Lungs:  clear to auscultation bilaterally, no wheezing, rhonchi or rales  Abd: soft, nontender, no hepatomegaly  Ext: no edema Musculoskeletal:  No deformities, BUE and BLE strength normal and equal Skin: warm and dry  Neuro:  CNs 2-12 intact, no focal abnormalities noted Psych:  Normal affect    EKG:   sinus rhythm with non-specific T wave abnormalities seen on prior; no STE/D  Relevant CV Studies: Left heart catheterization 01/2018:  Suezanne Jacquet  RCA to Prox RCA lesion is  20% stenosed.  Prox LAD lesion is 20% stenosed.  Prox RCA to Dist RCA lesion is 100% stenosed.  Ost LM to Mid LM lesion is 45% stenosed.  There is mild left ventricular systolic dysfunction.  The left ventricular ejection fraction is 50-55% by visual estimate.  LV end diastolic pressure is mildly elevated.  Ost 1st Mrg lesion is 100% stenosed.  Ost 1st Mrg to 1st Mrg lesion is 40% stenosed.  Origin to Insertion lesion between [facility] and RPDA is 100% stenosed.  Origin to Prox Graft lesion before Ost 1st Mrg is 95% stenosed.  A stent was successfully placed.  Post intervention, there is a 0% residual stenosis.   Multivessel native CAD with 45% mid left main stenosis.  Left main appeared to bifurcate rather than trifurcate into an LAD and circumflex vessel.    The LAD gave rise to an immediate early diagonal vessel which was very small caliber and appeared to have a flush and fill phenomena suggesting that this may have been the presumed ramus branch.  The stent in the proximal LAD was patent with intimal hyperplasia of 20%.  A flush and fill phenomena was seen in the mid LAD due to competitive LIMA filling.  The left circumflex is small caliber.  The obtuse marginal 1 was occluded at its origin.  There was collateralization to the PLA branch of the distal RCA from the left atrial branch.  The RCA was totally occluded after a previously placed proximal stent.  There was very faint bridging collaterals to small branches.  The PDA and PLA were collateralized via the left injection.  Patent LIMA graft which seems to anastomose into a proximal diagonal 2 vessel which then fills the LAD.  The LAD extensively collateralizes the PDA.  Sequential vein graft which had supplied the OM1 and PDA with 95% ostial eccentric stenosis and occlusion of the sequential limb after the OM1 vessel.  Long arduous attempt at cannulating the additional vein graft was unsuccessful despite multiple  catheters.  There was also no visualization of this vessel with left ventriculography with contrast in the aorta.  However, there does appear to be suggestion of competitive filling in the very high diagonal small caliber ramus like vessel.  Successful percutaneous coronary intervention to the ostium of the vein graft supplying the OM1 vessel with ultimate insertion of a 4.0 x 15 mm Resolute Onyx DES stent postdilated to 4.45 mm with the stenosis being reduced to 0%.  There is brisk TIMI-3 flow into the OM1 vessel.  There is mild 40% narrowing in the OM1 vessel after the graft anastomosis.  RECOMMENDATION: DAPT probably indefinitely.  Medical therapy for concomitant CAD.  Aggressive lipid-lowering therapy.   Laboratory Data:  Chemistry Recent Labs  Lab 05/26/18 1342  NA 139  K 3.6  CL 104  CO2 23  GLUCOSE 146*  BUN 17  CREATININE 1.08  CALCIUM 9.5  GFRNONAA >60  GFRAA >60  ANIONGAP 12    No results for input(s): PROT, ALBUMIN, AST, ALT, ALKPHOS, BILITOT in the last 168 hours. Hematology Recent Labs  Lab 05/26/18 1342  WBC 6.9  RBC 4.36  HGB 12.9*  HCT 39.5  MCV 90.6  MCH 29.6  MCHC 32.7  RDW 13.2  PLT 209   Cardiac EnzymesNo results for input(s): TROPONINI in the last 168 hours.  Recent Labs  Lab 05/26/18 1346  TROPIPOC 0.00    BNPNo results for input(s): BNP, PROBNP in  the last 168 hours.  DDimer No results for input(s): DDIMER in the last 168 hours.  Radiology/Studies:  Dg Chest 2 View  Result Date: 05/26/2018 CLINICAL DATA:  Midsternal chest pain. EXAM: CHEST - 2 VIEW COMPARISON:  09/12/2015 FINDINGS: Both lungs are clear. Negative for a pneumothorax. Small focus of sclerosis involving the anterior left third rib appears stable. Heart size is normal. Prior CABG procedure. Sternal wires appear to be intact. No large pleural effusions. Stable round lucency involving the proximal right humerus. IMPRESSION: No active cardiopulmonary disease. Electronically  Signed   By: Richarda Overlie M.D.   On: 05/26/2018 13:57    Assessment and Plan:   1. Unstable angina in patient with CAD s/p CABG with subsequent PCI: patient reported exertional chest pain for the past week requiring daily SL nitro. Last [facility] 01/2018 with PCI/DES to SVG to OM1. Trop negative x2. EKG without ischemic changes. CXR without acute findings. Symptoms concerning for unstable angina. He is currently chest pain free - Will plan for [facility] 05/27/18 - NPO after MN - Continue aspirin, brilinta, and resume statin  - Continue SL nitro prn.   2. HTN: BP elevated on admission - Continue amlodipine and metoprolol - Will hold lisinopril in anticipation of LHC  3. HLD: LDL 02/2018 was 52; at goal of <70 - Will resume home crestor 20mg  daily  4. PAD: followed by Dr. Imogene Burn and Dr. Kirke Corin. S/p right common femoral artery to above the knee popliteal bypass. - Continue brilinta, aspirin, and statin  Severity of Illness: The appropriate patient status for this patient is OBSERVATION. Observation status is judged to be reasonable and necessary in order to provide the required intensity of service to ensure the patient's safety. The patient's presenting symptoms, physical exam findings, and initial radiographic and laboratory data in the context of their medical condition is felt to place them at decreased risk for further clinical deterioration. Furthermore, it is anticipated that the patient will be medically stable for discharge from the hospital within 2 midnights of admission. The following factors support the patient status of observation.   " The patient's presenting symptoms include chest pain, exertional. " The physical exam findings include benign cardiopulmonary exam. " The initial radiographic and laboratory data are EKG without ischemic changes and negative troponins. .     For questions or updates, please contact [facility] Please consult [email or web address] for contact info under         Signed, Beatriz Stallion, PA-C  05/26/2018 4:25 PM    As above, patient seen and examined.  Briefly he is a 67 year old male with past medical history of coronary artery disease status post coronary artery bypass graft, peripheral vascular disease, hypertension, hyperlipidemia, COPD with unstable angina.  Last PCI in May 2019.  Over the past week he notes substernal chest pain with exertion relieved with rest and nitroglycerin.  Last evening he had an episode of epigastric pain that he thought may be indigestion.  No improvement with Tums.  Resolved with nitroglycerin.  Presently pain-free.  Electrocardiogram shows sinus rhythm with nonspecific ST changes.  Initial enzymes negative.  1 unstable angina-patient's symptoms are extremely concerning.  We will plan cardiac catheterization tomorrow.  The risks and benefits including myocardial infarction, CVA and death discussed and he agrees to proceed.  Cycle enzymes.  Treat with aspirin and continue Brilinta.  Add heparin.  Resume Crestor 20 mg daily.  2 hypertension-continue preadmission blood pressure medications.  3 hyperlipidemia-resume Crestor 20 mg daily.  Check  lipids and liver in 4 weeks.  4 peripheral vascular disease-continue aspirin and statin.  No recent claudication.  Olga Millers, MD

## 2018-05-26 NOTE — ED Provider Notes (Signed)
Patient placed in Quick Look pathway, seen and evaluated   Chief Complaint: chest pain  HPI:   [name] is a 67 y.o. male who presents to the ED with chest pain and shortness of breath. Symptoms started about a week ago and have gotten worse. Patient reports that when he takes his nitro the pain goes away but then comes back. He reports feeling like indigestion and that is the way he felt when he had his first heart attack. Patient called his cardiologist and was told to come to the ED.  ROS: CV: chest pain  Resp: shortness of breath.  Physical Exam: BP (!) 153/72 (BP Location: Left Arm)   Pulse 64   Temp 97.7 F (36.5 C) (Oral)   Resp 20   Ht 5\' 10"  (1.778 m)   Wt 99.8 kg   SpO2 98%   BMI 31.57 kg/m    Gen: No distress, patient appears comfortable at this time.  Neuro: Awake and Alert  Skin: Warm and dry    Initiation of care has begun. The patient has been counseled on the process, plan, and necessity for staying for the completion/evaluation, and the remainder of the medical screening examination    Janne Napoleon, NP 05/26/18 1332    Azalia Bilis, MD 05/26/18 1622

## 2018-05-26 NOTE — Telephone Encounter (Signed)
Patient reports central burning chest pain that he has experienced for 1 week when he exerts himself. The pain is relieved when he takes one nitroglycerin however he has taken 1 daily for a week due to the pain returning. Patient reports some shortness of breath when he bends over and states the pain is similar to when he had his previous blockage and stent placement. Patient advised to be seen in the emergency department for this and to be driven there. Patient verbally understands and will follow up with our office.

## 2018-05-27 ENCOUNTER — Observation Stay (HOSPITAL_COMMUNITY): Payer: 59

## 2018-05-27 ENCOUNTER — Inpatient Hospital Stay (HOSPITAL_COMMUNITY)
Admission: EM | Disposition: A | Discharge status: Home / Self Care | Payer: Self-pay | Source: Home / Self Care | Attending: Cardiology

## 2018-05-27 DIAGNOSIS — J449 Chronic obstructive pulmonary disease, unspecified: Secondary | ICD-10-CM | POA: Diagnosis present

## 2018-05-27 DIAGNOSIS — I200 Unstable angina: Secondary | ICD-10-CM | POA: Diagnosis not present

## 2018-05-27 DIAGNOSIS — Z87891 Personal history of nicotine dependence: Secondary | ICD-10-CM | POA: Diagnosis not present

## 2018-05-27 DIAGNOSIS — Z955 Presence of coronary angioplasty implant and graft: Secondary | ICD-10-CM | POA: Diagnosis not present

## 2018-05-27 DIAGNOSIS — Z7951 Long term (current) use of inhaled steroids: Secondary | ICD-10-CM | POA: Diagnosis not present

## 2018-05-27 DIAGNOSIS — I5030 Unspecified diastolic (congestive) heart failure: Secondary | ICD-10-CM

## 2018-05-27 DIAGNOSIS — E785 Hyperlipidemia, unspecified: Secondary | ICD-10-CM | POA: Diagnosis not present

## 2018-05-27 DIAGNOSIS — I25119 Atherosclerotic heart disease of native coronary artery with unspecified angina pectoris: Secondary | ICD-10-CM

## 2018-05-27 DIAGNOSIS — I252 Old myocardial infarction: Secondary | ICD-10-CM | POA: Diagnosis not present

## 2018-05-27 DIAGNOSIS — K219 Gastro-esophageal reflux disease without esophagitis: Secondary | ICD-10-CM | POA: Diagnosis present

## 2018-05-27 DIAGNOSIS — I10 Essential (primary) hypertension: Secondary | ICD-10-CM | POA: Diagnosis present

## 2018-05-27 DIAGNOSIS — Z7982 Long term (current) use of aspirin: Secondary | ICD-10-CM | POA: Diagnosis not present

## 2018-05-27 DIAGNOSIS — Z885 Allergy status to narcotic agent status: Secondary | ICD-10-CM | POA: Diagnosis not present

## 2018-05-27 DIAGNOSIS — Z79899 Other long term (current) drug therapy: Secondary | ICD-10-CM | POA: Diagnosis not present

## 2018-05-27 DIAGNOSIS — I739 Peripheral vascular disease, unspecified: Secondary | ICD-10-CM | POA: Diagnosis present

## 2018-05-27 DIAGNOSIS — Z951 Presence of aortocoronary bypass graft: Secondary | ICD-10-CM | POA: Diagnosis not present

## 2018-05-27 DIAGNOSIS — I25110 Atherosclerotic heart disease of native coronary artery with unstable angina pectoris: Secondary | ICD-10-CM | POA: Diagnosis present

## 2018-05-27 DIAGNOSIS — R7303 Prediabetes: Secondary | ICD-10-CM | POA: Diagnosis present

## 2018-05-27 DIAGNOSIS — I714 Abdominal aortic aneurysm, without rupture: Secondary | ICD-10-CM | POA: Diagnosis present

## 2018-05-27 HISTORY — PX: LEFT HEART CATH AND CORS/GRAFTS ANGIOGRAPHY: CATH118250

## 2018-05-27 LAB — CREATININE, SERUM
Creatinine, Ser: 0.88 mg/dL (ref 0.61–1.24)
GFR calc Af Amer: >60 mL/min (ref >60)
GFR calc non Af Amer: >60 mL/min (ref >60)

## 2018-05-27 LAB — ECHOCARDIOGRAM COMPLETE
Height: 70 in
WEIGHTICAEL: 3665.6 [oz_av]

## 2018-05-27 LAB — BASIC METABOLIC PANEL
ANION GAP: 11 (ref 5–15)
BUN: 16 mg/dL (ref 8–23)
CALCIUM: 9.1 mg/dL (ref 8.9–10.3)
CO2: 25 mmol/L (ref 22–32)
CREATININE: 0.99 mg/dL (ref 0.61–1.24)
Chloride: 104 mmol/L (ref 98–111)
GFR calc Af Amer: >60 mL/min (ref >60)
GLUCOSE: 134 mg/dL — AB (ref 70–99)
Potassium: 3.7 mmol/L (ref 3.5–5.1)
Sodium: 140 mmol/L (ref 135–145)

## 2018-05-27 LAB — CBC
HCT: 40.5 % (ref 39.0–52.0)
Hemoglobin: 13.4 g/dL (ref 13.0–17.0)
MCH: 29.6 pg (ref 26.0–34.0)
MCHC: 33.1 g/dL (ref 30.0–36.0)
MCV: 89.6 fL (ref 78.0–100.0)
Platelets: 201 10*3/uL (ref 150–400)
RBC: 4.52 MIL/uL (ref 4.22–5.81)
RDW: 13.2 % (ref 11.5–15.5)
WBC: 8.1 10*3/uL (ref 4.0–10.5)

## 2018-05-27 LAB — HIV ANTIBODY (ROUTINE TESTING W REFLEX): HIV SCREEN 4TH GENERATION: NONREACTIVE

## 2018-05-27 SURGERY — LEFT HEART CATH AND CORS/GRAFTS ANGIOGRAPHY
Anesthesia: LOCAL

## 2018-05-27 MED ORDER — DIAZEPAM 5 MG PO TABS
5.0000 mg | ORAL_TABLET | Freq: Four times a day (QID) | ORAL | Status: DC | PRN
  Administered 2018-05-27: 5 mg via ORAL
  Filled 2018-05-27: qty 1

## 2018-05-27 MED ORDER — ONDANSETRON HCL 4 MG/2ML IJ SOLN: 4.0000 mg | Freq: Four times a day (QID) | INTRAMUSCULAR | Status: DC | PRN

## 2018-05-27 MED ORDER — FENTANYL CITRATE (PF) 100 MCG/2ML IJ SOLN
INTRAMUSCULAR | Status: AC
  Filled 2018-05-27: qty 2

## 2018-05-27 MED ORDER — LIDOCAINE HCL (PF) 1 % IJ SOLN
INTRAMUSCULAR | Status: AC
  Filled 2018-05-27: qty 30

## 2018-05-27 MED ORDER — HEPARIN (PORCINE) IN NACL 1000-0.9 UT/500ML-% IV SOLN
INTRAVENOUS | Status: AC
  Filled 2018-05-27: qty 1500

## 2018-05-27 MED ORDER — ISOSORBIDE MONONITRATE ER 30 MG PO TB24
30.0000 mg | ORAL_TABLET | Freq: Every day | ORAL | Status: DC
  Administered 2018-05-27 – 2018-05-28 (×2): 30 mg via ORAL
  Filled 2018-05-27 (×2): qty 1

## 2018-05-27 MED ORDER — MIDAZOLAM HCL 2 MG/2ML IJ SOLN
INTRAMUSCULAR | Status: DC | PRN
  Administered 2018-05-27: 2 mg via INTRAVENOUS

## 2018-05-27 MED ORDER — ONDANSETRON HCL 4 MG/2ML IJ SOLN
INTRAMUSCULAR | Status: AC
  Filled 2018-05-27: qty 2

## 2018-05-27 MED ORDER — MIDAZOLAM HCL 2 MG/2ML IJ SOLN
INTRAMUSCULAR | Status: AC
  Filled 2018-05-27: qty 2

## 2018-05-27 MED ORDER — SODIUM CHLORIDE 0.9 % IV SOLN: 250.0000 mL | INTRAVENOUS | Status: DC | PRN

## 2018-05-27 MED ORDER — HYDRALAZINE HCL 20 MG/ML IJ SOLN
10.0000 mg | Freq: Once | INTRAMUSCULAR | Status: AC
  Administered 2018-05-27: 10 mg via INTRAVENOUS

## 2018-05-27 MED ORDER — FENTANYL CITRATE (PF) 100 MCG/2ML IJ SOLN
INTRAMUSCULAR | Status: DC | PRN
  Administered 2018-05-27: 50 ug via INTRAVENOUS

## 2018-05-27 MED ORDER — HYDRALAZINE HCL 20 MG/ML IJ SOLN
INTRAMUSCULAR | Status: DC | PRN
  Administered 2018-05-27: 10 mg via INTRAVENOUS

## 2018-05-27 MED ORDER — HYDRALAZINE HCL 20 MG/ML IJ SOLN
INTRAMUSCULAR | Status: AC
  Filled 2018-05-27: qty 1

## 2018-05-27 MED ORDER — SODIUM CHLORIDE 0.9 % IV SOLN: INTRAVENOUS | Status: DC

## 2018-05-27 MED ORDER — VERAPAMIL HCL 2.5 MG/ML IV SOLN
INTRAVENOUS | Status: AC
  Filled 2018-05-27: qty 2

## 2018-05-27 MED ORDER — SODIUM CHLORIDE 0.9% FLUSH: 3.0000 mL | INTRAVENOUS | Status: DC | PRN

## 2018-05-27 MED ORDER — ASPIRIN 81 MG PO CHEW
81.0000 mg | CHEWABLE_TABLET | Freq: Every day | ORAL | Status: DC
  Administered 2018-05-28: 81 mg via ORAL
  Filled 2018-05-27: qty 1

## 2018-05-27 MED ORDER — ENOXAPARIN SODIUM 40 MG/0.4ML ~~LOC~~ SOLN
40.0000 mg | SUBCUTANEOUS | Status: DC
  Administered 2018-05-28: 40 mg via SUBCUTANEOUS
  Filled 2018-05-27: qty 0.4

## 2018-05-27 MED ORDER — TICAGRELOR 90 MG PO TABS
90.0000 mg | ORAL_TABLET | Freq: Two times a day (BID) | ORAL | Status: DC
  Administered 2018-05-27 – 2018-05-28 (×2): 90 mg via ORAL
  Filled 2018-05-27 (×2): qty 1

## 2018-05-27 MED ORDER — HEPARIN (PORCINE) IN NACL 1000-0.9 UT/500ML-% IV SOLN
INTRAVENOUS | Status: DC | PRN
  Administered 2018-05-27 (×3): 500 mL

## 2018-05-27 MED ORDER — HEPARIN (PORCINE) IN NACL 100-0.45 UNIT/ML-% IJ SOLN: 1350.0000 [IU]/h | INTRAMUSCULAR | Status: DC

## 2018-05-27 MED ORDER — SODIUM CHLORIDE 0.9% FLUSH
3.0000 mL | Freq: Two times a day (BID) | INTRAVENOUS | Status: DC
  Administered 2018-05-27 – 2018-05-28 (×2): 3 mL via INTRAVENOUS

## 2018-05-27 MED ORDER — IOHEXOL 350 MG/ML SOLN
INTRAVENOUS | Status: DC | PRN
  Administered 2018-05-27: 120 mL via INTRA_ARTERIAL

## 2018-05-27 MED ORDER — ACETAMINOPHEN 325 MG PO TABS
650.0000 mg | ORAL_TABLET | ORAL | Status: DC | PRN
  Administered 2018-05-27: 650 mg via ORAL
  Filled 2018-05-27: qty 2

## 2018-05-27 MED ORDER — HEPARIN BOLUS VIA INFUSION
4000.0000 [IU] | Freq: Once | INTRAVENOUS | Status: DC
  Filled 2018-05-27: qty 4000

## 2018-05-27 MED ORDER — LIDOCAINE HCL (PF) 1 % IJ SOLN
INTRAMUSCULAR | Status: DC | PRN
  Administered 2018-05-27: 20 mL

## 2018-05-27 SURGICAL SUPPLY — 11 items
CATH INFINITI 5 FR AL2 (CATHETERS) ×2 IMPLANT
CATH INFINITI 5 FR IM (CATHETERS) ×2 IMPLANT
CATH INFINITI 5 FR LCB (CATHETERS) ×2 IMPLANT
CATH INFINITI 5FR MULTPACK ANG (CATHETERS) ×2 IMPLANT
KIT HEART LEFT (KITS) ×2 IMPLANT
PACK CARDIAC CATHETERIZATION (CUSTOM PROCEDURE TRAY) ×2 IMPLANT
SHEATH PINNACLE 5F 10CM (SHEATH) ×2 IMPLANT
TRANSDUCER W/STOPCOCK (MISCELLANEOUS) ×2 IMPLANT
TUBING CIL FLEX 10 FLL-RA (TUBING) ×2 IMPLANT
WIRE EMERALD 3MM-J .035X150CM (WIRE) ×2 IMPLANT
WIRE EMERALD 3MM-J .035X260CM (WIRE) ×2 IMPLANT

## 2018-05-27 NOTE — Progress Notes (Signed)
Progress Note  Patient Name: [name] Date of Encounter: 05/27/2018  Primary Cardiologist: Olga Millers, MD   Subjective   No CP or dyspnea  Inpatient Medications    Scheduled Meds: . amLODipine  10 mg Oral Daily  . aspirin EC  81 mg Oral Daily  . enoxaparin (LOVENOX) injection  40 mg Subcutaneous Q24H  . loratadine  10 mg Oral Daily  . metoprolol succinate  25 mg Oral Daily  . pantoprazole  40 mg Oral Daily  . rosuvastatin  20 mg Oral q1800  . sodium chloride flush  3 mL Intravenous Q12H  . ticagrelor  90 mg Oral BID   Continuous Infusions: . sodium chloride    . sodium chloride     PRN Meds: sodium chloride, diphenhydrAMINE **AND** acetaminophen, acetaminophen, albuterol, nitroGLYCERIN, ondansetron (ZOFRAN) IV, sodium chloride flush   Vital Signs    Vitals:   05/26/18 1845 05/26/18 2100 05/27/18 0518 05/27/18 0749  BP: (!) 150/81 (!) 157/75 (!) 144/73 (!) 154/96  Pulse: (!) 58 63 62   Resp: (!) 22 (!) 21    Temp: 97.8 F (36.6 C) 98 F (36.7 C) 97.6 F (36.4 C)   TempSrc: Oral Oral Oral   SpO2: 93% 100% 99%   Weight: 104.6 kg  103.9 kg   Height: 5\' 10"  (1.778 m)       Intake/Output Summary (Last 24 hours) at 05/27/2018 0911 Last data filed at 05/26/2018 2330 Gross per 24 hour  Intake 120 ml  Output -  Net 120 ml   Filed Weights   05/26/18 1301 05/26/18 1845 05/27/18 0518  Weight: 99.8 kg 104.6 kg 103.9 kg    Telemetry    Sinus bradycardia - Personally Reviewed   Physical Exam   GEN: No acute distress.   Neck: No JVD Cardiac: RRR, no murmurs, rubs, or gallops.  Respiratory: Clear to auscultation bilaterally. GI: Soft, nontender, non-distended  MS: No edema Neuro:  Nonfocal  Psych: Normal affect   Labs    Chemistry Recent Labs  Lab 05/26/18 1342 05/26/18 2024 05/27/18 0603  NA 139  --  140  K 3.6  --  3.7  CL 104  --  104  CO2 23  --  25  GLUCOSE 146*  --  134*  BUN 17  --  16  CREATININE 1.08 1.00 0.99  CALCIUM 9.5   --  9.1  GFRNONAA >60 >60 >60  GFRAA >60 >60 >60  ANIONGAP 12  --  11     Hematology Recent Labs  Lab 05/26/18 1342 05/26/18 2024  WBC 6.9 6.6  RBC 4.36 4.32  HGB 12.9* 12.7*  HCT 39.5 39.2  MCV 90.6 90.7  MCH 29.6 29.4  MCHC 32.7 32.4  RDW 13.2 13.2  PLT 209 201    Recent Labs  Lab 05/26/18 1346 05/26/18 1637  TROPIPOC 0.00 0.00     Radiology    Dg Chest 2 View  Result Date: 05/26/2018 CLINICAL DATA:  Midsternal chest pain. EXAM: CHEST - 2 VIEW COMPARISON:  09/12/2015 FINDINGS: Both lungs are clear. Negative for a pneumothorax. Small focus of sclerosis involving the anterior left third rib appears stable. Heart size is normal. Prior CABG procedure. Sternal wires appear to be intact. No large pleural effusions. Stable round lucency involving the proximal right humerus. IMPRESSION: No active cardiopulmonary disease. Electronically Signed   By: Richarda Overlie M.D.   On: 05/26/2018 13:57    Patient Profile     67 year old male with  past medical history of coronary artery disease status post coronary artery bypass graft, peripheral vascular disease, hypertension, hyperlipidemia, COPD with unstable angina.  Last PCI in May 2019.    Assessment & Plan    1 unstable angina-patient pain-free this morning.  Initial symptoms suggestive of unstable angina.  Plan to proceed with cardiac catheterization today.  Risks and benefits including myocardial infarction, CVA and death discussed previously and he agreed to proceed. Continue aspirin and Brilinta. Continue heparin and statin.  2 hypertension-blood pressure mildly elevated.  We will follow and advance as needed.  3 hyperlipidemia-continue Crestor 20 mg daily.  Check lipids and liver in 4 weeks.  4 peripheral vascular disease-continue aspirin and statin.    5 AAA-fu ultrasound following DC  For questions or updates, please contact [facility] Please consult [email or web address] for contact info under        Signed, Olga Millers, MD  05/27/2018, 9:11 AM

## 2018-05-27 NOTE — Progress Notes (Signed)
Site area: LFA Site Prior to Removal:  Level 0 Pressure Applied For:20 min Manual:  yes  Patient Status During Pull:  stable Post Pull Site:  Level 0 Post Pull Instructions Given: yes  Post Pull Pulses Present: palpable Dressing Applied:  clear Bedrest begins @ 1640 till 2040 Comments: nausea without hemodynamic changes

## 2018-05-27 NOTE — Progress Notes (Signed)
2D Echocardiogram has been performed.  05/27/2018, 9:04 AM

## 2018-05-27 NOTE — Plan of Care (Signed)
°  Problem: Education: °Goal: Knowledge of General Education information will improve °Description: Including pain rating scale, medication(s)/side effects and non-pharmacologic comfort measures °Outcome: Progressing °  °Problem: Clinical Measurements: °Goal: Ability to maintain clinical measurements within normal limits will improve °Outcome: Progressing °  °Problem: Clinical Measurements: °Goal: Cardiovascular complication will be avoided °Outcome: Progressing °  °

## 2018-05-27 NOTE — Progress Notes (Addendum)
Addendum 9/20 1335 MD d/c heparin as pt is on schedule to go to cath at 1400.  Christoper Fabian, PharmD, BCPS Clinical pharmacist   ANTICOAGULATION CONSULT NOTE - Initial Consult  Pharmacy Consult for Heparin Indication: chest pain/ACS  Allergies  Allergen Reactions  . Neosporin [Neomycin-Bacitracin Zn-Polymyx] Dermatitis  . Codeine Nausea And Vomiting    Patient Measurements: Height: 5\' 10"  (177.8 cm) Weight: 229 lb 1.6 oz (103.9 kg) IBW/kg (Calculated) : 73 Heparin Dosing Weight: 96 kg  Vital Signs: Temp: 97.6 F (36.4 C) (09/20 0518) Temp Source: Oral (09/20 0518) BP: 154/96 (09/20 0749) Pulse Rate: 62 (09/20 0518)  Labs: Recent Labs    05/26/18 1342 05/26/18 2024 05/27/18 0603  HGB 12.9* 12.7*  --   HCT 39.5 39.2  --   PLT 209 201  --   CREATININE 1.08 1.00 0.99    Estimated Creatinine Clearance: 89.9 mL/min (by C-G formula based on SCr of 0.99 mg/dL).   Medical History: Past Medical History:  Diagnosis Date  . Arthritis   . Asthma    occ bronchitis  . Bronchitis   . COPD (chronic obstructive pulmonary disease) (HCC)   . Coronary artery disease    a. s/p multiple prior PCIs; b. s/p CABG 06/2006 (L-LAD, S-Int and OM, S-PDA);  c. ETT-Myoview 4/13: low risk, no ischemia, EF 53%  . GERD (gastroesophageal reflux disease)   . Hx of echocardiogram    Echo 10/2006: normal LVF  . Hyperlipidemia   . Hypertension   . Myocardial infarction (HCC)   . Peripheral vascular disease (HCC)   . Seasonal allergies   . Wears dentures   . Wears glasses     Medications:  Medications Prior to Admission  Medication Sig Dispense Refill Last Dose  . albuterol (PROVENTIL HFA;VENTOLIN HFA) 108 (90 Base) MCG/ACT inhaler Inhale 2 puffs into the lungs every 4 (four) hours as needed for wheezing or shortness of breath (cough, shortness of breath or wheezing.). 1 Inhaler 0  at unk  . amLODipine (NORVASC) 10 MG tablet Take 1 tablet (10 mg total) by mouth daily. 90 tablet 1 05/26/2018  at Unknown time  . aspirin EC 81 MG tablet Take 81 mg by mouth daily.   05/26/2018 at Unknown time  . cetirizine (ZYRTEC) 10 MG tablet Take 10 mg by mouth daily.    05/26/2018 at Unknown time  . diphenhydramine-acetaminophen (TYLENOL PM) 25-500 MG TABS tablet Take 2 tablets by mouth at bedtime.   05/25/2018 at Unknown time  . lisinopril (PRINIVIL,ZESTRIL) 10 MG tablet Take 10 mg by mouth daily.  1 05/26/2018 at Unknown time  . metoprolol succinate (TOPROL-XL) 25 MG 24 hr tablet Take 1 tablet (25 mg total) by mouth daily. Take with or immediately following a meal. 90 tablet 1 05/26/2018 at 0700  . nitroGLYCERIN (NITROSTAT) 0.4 MG SL tablet Place 1 tablet (0.4 mg total) under the tongue every 5 (five) minutes as needed for chest pain. 25 tablet 2 05/26/2018 at Unknown time  . omeprazole (PRILOSEC) 20 MG capsule Take 1 capsule (20 mg total) by mouth daily. 30 capsule 2 05/26/2018 at Unknown time  . ticagrelor (BRILINTA) 90 MG TABS tablet Take 1 tablet (90 mg total) by mouth 2 (two) times daily. 180 tablet 3 05/26/2018 at 0700    Assessment: 67 y.o. M presents with [place]. Pharmacy consulted for heparin dosing. Noted plans for cath today. CBC stable on admission.  Goal of Therapy:  Heparin level 0.3-0.7 units/ml Monitor platelets by anticoagulation protocol: Yes  Plan:  Heparin IV bolus 4000 units Heparin gtt at 1350 units/hr Will f/u heparin level in 6 hours or f/u post cath Daily heparin level and CBC  Christoper Fabian, PharmD, BCPS Clinical pharmacist  **Pharmacist phone directory can now be found on [email or web address] ([email or web address]).  Listed under [facility]. 05/27/2018,9:39 AM

## 2018-05-28 ENCOUNTER — Encounter (HOSPITAL_COMMUNITY): Payer: Self-pay | Admitting: Cardiovascular Disease

## 2018-05-28 LAB — CBC
HEMATOCRIT: 36.3 % — AB (ref 39.0–52.0)
HEMOGLOBIN: 11.8 g/dL — AB (ref 13.0–17.0)
MCH: 29.4 pg (ref 26.0–34.0)
MCHC: 32.5 g/dL (ref 30.0–36.0)
MCV: 90.5 fL (ref 78.0–100.0)
Platelets: 177 10*3/uL (ref 150–400)
RBC: 4.01 MIL/uL — ABNORMAL LOW (ref 4.22–5.81)
RDW: 13.2 % (ref 11.5–15.5)
WBC: 8.4 10*3/uL (ref 4.0–10.5)

## 2018-05-28 MED ORDER — ISOSORBIDE MONONITRATE ER 60 MG PO TB24: 60.0000 mg | ORAL_TABLET | Freq: Every day | ORAL | Status: DC

## 2018-05-28 MED ORDER — ISOSORBIDE MONONITRATE ER 60 MG PO TB24: 60.0000 mg | ORAL_TABLET | Freq: Every day | ORAL | 3 refills | Status: DC

## 2018-05-28 NOTE — Progress Notes (Signed)
Progress Note  Patient Name: [name] Date of Encounter: 05/28/2018  Primary Cardiologist: Olga Millers, MD   Subjective  No CP   No SOB    Inpatient Medications    Scheduled Meds: . amLODipine  10 mg Oral Daily  . aspirin  81 mg Oral Daily  . enoxaparin (LOVENOX) injection  40 mg Subcutaneous Q24H  . isosorbide mononitrate  30 mg Oral Daily  . loratadine  10 mg Oral Daily  . metoprolol succinate  25 mg Oral Daily  . pantoprazole  40 mg Oral Daily  . rosuvastatin  20 mg Oral q1800  . sodium chloride flush  3 mL Intravenous Q12H  . ticagrelor  90 mg Oral BID   Continuous Infusions: . sodium chloride Stopped (05/28/18 0745)  . sodium chloride     PRN Meds: sodium chloride, diphenhydrAMINE **AND** acetaminophen, acetaminophen, albuterol, diazepam, nitroGLYCERIN, ondansetron (ZOFRAN) IV, sodium chloride flush   Vital Signs    Vitals:   05/27/18 2036 05/27/18 2159 05/28/18 0548 05/28/18 0550  BP: 131/70 128/70  133/67  Pulse: 74 74  63  Resp: (!) 22 18  17   Temp:  97.8 F (36.6 C)  97.8 F (36.6 C)  TempSrc:  Oral  Oral  SpO2: 98% 97%  98%  Weight:   104.9 kg   Height:        Intake/Output Summary (Last 24 hours) at 05/28/2018 0902 Last data filed at 05/28/2018 0612 Gross per 24 hour  Intake 1873.35 ml  Output 1300 ml  Net 573.35 ml   Filed Weights   05/26/18 1845 05/27/18 0518 05/28/18 0548  Weight: 104.6 kg 103.9 kg 104.9 kg    Telemetry    SR 50s to 70s   - Personally Reviewed   Physical Exam   GEN: No acute distress.   Neck: JVP normal    Cardiac: RRR, no murmurs, rubs, or gallops.   R groin without hematoma No bruit   2+ pulses  No edema   Respiratory: Clear to auscultation bilaterally. GI: Soft, nontender, non-distended  MS: No edema Neuro:  Nonfocal  Psych: Normal affect   Labs    Chemistry Recent Labs  Lab 05/26/18 1342 05/26/18 2024 05/27/18 0603 05/27/18 1830  NA 139  --  140  --   K 3.6  --  3.7  --   CL 104  --   104  --   CO2 23  --  25  --   GLUCOSE 146*  --  134*  --   BUN 17  --  16  --   CREATININE 1.08 1.00 0.99 0.88  CALCIUM 9.5  --  9.1  --   GFRNONAA >60 >60 >60 >60  GFRAA >60 >60 >60 >60  ANIONGAP 12  --  11  --      Hematology Recent Labs  Lab 05/26/18 2024 05/27/18 1830 05/28/18 0418  WBC 6.6 8.1 8.4  RBC 4.32 4.52 4.01*  HGB 12.7* 13.4 11.8*  HCT 39.2 40.5 36.3*  MCV 90.7 89.6 90.5  MCH 29.4 29.6 29.4  MCHC 32.4 33.1 32.5  RDW 13.2 13.2 13.2  PLT 201 201 177    Recent Labs  Lab 05/26/18 1346 05/26/18 1637  TROPIPOC 0.00 0.00      STUDIES  Cath 9/20     Prox RCA to Mid RCA lesion is 100% stenosed.  Prox RCA lesion is 100% stenosed.  Ost 1st Mrg lesion is 100% stenosed.  Origin lesion is 100% stenosed.  Ost 3rd Mrg lesion is 100% stenosed.  Prox Graft to Insertion lesion between 1st Mrg and 3rd Mrg is 100% stenosed.  Ost LM to Mid LM lesion is 45% stenosed.  Ost LAD to Prox LAD lesion is 45% stenosed.  Ost 1st Diag to 1st Diag lesion is 99% stenosed.   Significant native multivessel CAD with 40% focal mid left main stenosis, 50% intimal hyperplasia in the proximal LAD stent, subtotal first diagonal branch of the LAD and competitive filling of the mid LAD due to the LIMA graft.  Occluded ramus/high OM and mid OM vessel at its origin from the AV groove circumflex hemicolon and previously documented total occlusion of the proximal RCA prior to the previously placed stent with mild antegrade collaterals to the mid RCA but extensive retrograde collaterals supplying the PDA and PLA vessels from the left circumflex as well as the distal LAD.  Patent LIMA graft to the LAD.  Patent ostial stent in the sequential vein graft to the ramus and OM vessel with mild 10 to 15% intimal hyperplasia, and previously noted occluded sequential limb.  Occluded vein graft to PDA.  RECOMMENDATION: Increase antianginal medical therapy.  High potency statin therapy with  target LDL less than 70. Recommend dual antiplatelet therapy with Aspirin 81mg  daily and Clopidogrel 75mg  daily long-term (beyond 12 months) because of significant CAD and  prior stent occlusion.   Radiology    Dg Chest 2 View  Result Date: 05/26/2018 CLINICAL DATA:  Midsternal chest pain. EXAM: CHEST - 2 VIEW COMPARISON:  09/12/2015 FINDINGS: Both lungs are clear. Negative for a pneumothorax. Small focus of sclerosis involving the anterior left third rib appears stable. Heart size is normal. Prior CABG procedure. Sternal wires appear to be intact. No large pleural effusions. Stable round lucency involving the proximal right humerus. IMPRESSION: No active cardiopulmonary disease. Electronically Signed   By: Richarda Overlie M.D.   On: 05/26/2018 13:57    Patient Profile     67 year old male with past medical history of coronary artery disease status post coronary artery bypass graft, peripheral vascular disease, hypertension, hyperlipidemia, COPD with unstable angina.  Last PCI in May 2019.    Assessment & Plan    1 unstable angina-Cath as noted above  Pt to go home on DAPT longterm  Had been on brilinta  WOld continue Will need aggressive control of BP and lipids     See below    Will need repatha as outpt I increased imdur to 60   Now on metoprolol low dose   COntinue amlodipine     2 hypertension-  As abive   Meds adjusted    3 hyperlipidemia-  Intolerant to lipitro and crestor  (pt had stopped Crestor a few wks ago )    WIll need to be set up with pharmacy to initiate Repatha    4 peripheral vascular disease-  5 AAA-  Follow     Pt OK for d/c today   F/U in cilnic  For questions or updates, please contact [facility] Please consult [email or web address] for contact info under        Signed, Dietrich Pates, MD  05/28/2018, 9:02 AM

## 2018-05-28 NOTE — Progress Notes (Signed)
Nsg Discharge Note  Admit Date:  05/26/2018 Discharge date: 05/28/2018   [name] to be D/C'd Home per MD order.  AVS completed.  Copy for chart, and copy for patient signed, and dated. Patient/caregiver able to verbalize understanding.  Discharge Medication: Allergies as of 05/28/2018      Reactions   Neosporin [neomycin-bacitracin Zn-polymyx] Dermatitis   Codeine Nausea And Vomiting      Medication List    STOP taking these medications   lisinopril 10 MG tablet Commonly known as:  PRINIVIL,ZESTRIL     TAKE these medications   albuterol 108 (90 Base) MCG/ACT inhaler Commonly known as:  PROVENTIL HFA;VENTOLIN HFA Inhale 2 puffs into the lungs every 4 (four) hours as needed for wheezing or shortness of breath (cough, shortness of breath or wheezing.).   amLODipine 10 MG tablet Commonly known as:  NORVASC Take 1 tablet (10 mg total) by mouth daily.   aspirin EC 81 MG tablet Take 81 mg by mouth daily.   cetirizine 10 MG tablet Commonly known as:  ZYRTEC Take 10 mg by mouth daily.   diphenhydramine-acetaminophen 25-500 MG Tabs tablet Commonly known as:  TYLENOL PM Take 2 tablets by mouth at bedtime.   isosorbide mononitrate 60 MG 24 hr tablet Commonly known as:  IMDUR Take 1 tablet (60 mg total) by mouth daily. Start taking on:  05/29/2018   metoprolol succinate 25 MG 24 hr tablet Commonly known as:  TOPROL-XL Take 1 tablet (25 mg total) by mouth daily. Take with or immediately following a meal.   nitroGLYCERIN 0.4 MG SL tablet Commonly known as:  NITROSTAT Place 1 tablet (0.4 mg total) under the tongue every 5 (five) minutes as needed for chest pain.   omeprazole 20 MG capsule Commonly known as:  PRILOSEC Take 1 capsule (20 mg total) by mouth daily.   ticagrelor 90 MG Tabs tablet Commonly known as:  BRILINTA Take 1 tablet (90 mg total) by mouth 2 (two) times daily.       Discharge Assessment: Vitals:   05/27/18 2159 05/28/18 0550  BP: 128/70 133/67   Pulse: 74 63  Resp: 18 17  Temp: 97.8 F (36.6 C) 97.8 F (36.6 C)  SpO2: 97% 98%   Skin clean, dry and intact without evidence of skin break down, no evidence of skin tears noted. IV catheter discontinued intact. Site without signs and symptoms of complications - no redness or edema noted at insertion site, patient denies c/o pain - only slight tenderness at site.  Dressing with slight pressure applied.  D/c Instructions-Education: Discharge instructions given to patient/family with verbalized understanding. D/c education completed with patient/family including follow up instructions, medication list, d/c activities limitations if indicated, with other d/c instructions as indicated by MD - patient able to verbalize understanding, all questions fully answered. Patient instructed to return to ED, call 911, or call MD for any changes in condition.  Patient escorted via WC, and D/C home via private auto.  Adair Laundry, RN 05/28/2018 10:59 AM

## 2018-05-28 NOTE — Discharge Summary (Signed)
Discharge Summary    Patient ID: [name] MRN: [id number]; DOB: 25-Nov-1950  Admit date: 05/26/2018 Discharge date: 05/28/2018  Primary Care Provider: Sherren Mocha, MD  Primary Cardiologist: Dale Millers, MD  Primary Electrophysiologist:  None   Discharge Diagnoses    Principal Problem:   Unstable angina [name]) Active Problems:   Hyperlipidemia LDL goal <70   Essential hypertension   Prediabetes   Antiplatelet or antithrombotic long-term use   Allergies Allergies  Allergen Reactions  . Neosporin [Neomycin-Bacitracin Zn-Polymyx] Dermatitis  . Codeine Nausea And Vomiting    Diagnostic Studies/Procedures    Cath 9/20    Prox RCA to Mid RCA lesion is 100% stenosed.  Prox RCA lesion is 100% stenosed.  Ost 1st Mrg lesion is 100% stenosed.  Origin lesion is 100% stenosed.  Ost 3rd Mrg lesion is 100% stenosed.  Prox Graft to Insertion lesion between 1st Mrg and 3rd Mrg is 100% stenosed.  Ost LM to Mid LM lesion is 45% stenosed.  Ost LAD to Prox LAD lesion is 45% stenosed.  Ost 1st Diag to 1st Diag lesion is 99% stenosed.  Significant native multivessel CAD with 40% focal mid left main stenosis, 50% intimal hyperplasia in the proximal LAD stent, subtotal first diagonal branch of the LAD and competitive filling of the mid LAD due to the LIMA graft. Occluded ramus/high OM and mid OM vessel at its origin from the AV groove circumflex hemicolon and previously documented total occlusion of the proximal RCA prior to the previously placed stent with mild antegrade collaterals to the mid RCA but extensive retrograde collaterals supplying the PDA and PLA vessels from the left circumflex as well as the distal LAD.  Patent LIMA graft to the LAD.  Patent ostial stent in the sequential vein graft to the ramus and OM vessel with mild 10 to 15% intimal hyperplasia, and previously noted occluded sequential limb.  Occluded vein graft to PDA.  RECOMMENDATION: Increase  antianginal medical therapy. High potency statin therapy with target LDL less than 70. Recommend dual antiplatelet therapy with Aspirin 81mg  daily and Clopidogrel 75mg  daily long-term (beyond 12 months) because of significant CAD and prior stent occlusion.    History of Present Illness     [name] was in his usual state of health until 1 week prior to ED admission on 05/26/18 when he began experiencing exertional chest pain. 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 He has also had a couple episodes of epigastric discomfort described as indigestion; last episode today. He states when he had his initial MI he presented with indigestion complaints, and remembering this, he took a SL nitro today with relief of symptoms. He denies associated SOB, DOE, N/V, dizziness, lightheadedness, syncope, or diaphoresis with any of his episodes. Patients wife called the office today with concerns of frequent SL nitroglycerin use for exertional chest pain and she was instructed to bring the patient to the ED for further evaluation.   He was last seen outpatient by cardiology, Dale Reining, NP, 01/2018 in follow-up of recent hospitalization for chest pain where he had an abnormal stress test and underwent cardiac catheterization which revealed a 45% mid left main, patent LIMA to LAD, sequential SVG to OM1 and PDA had a 95% ostial eccentric stenosis and occlusion of the sequential limb after the OM 1 vessel, 95% ostial SVG which was treated with a drug-eluting stent, the second half of  the sequential SVG to PDA was occluded. He was recommended for life long DAPT with aspirin and brilinta.   At the time of this evaluation he was chest pain free. Prior to 1 week ago he had not had any chest pain since his last LHC with stent placement in 01/2018. He reported occasional sudden onset SOB which he reported started while still in the  Wolfe after LHC - suspicious for brilinta side effect - which he states has improved over the past several months. He has bendopnea which he attributes to abdominal girth which is unchanged. He denies recent weight gain, orthopnea, PND, or LE edema. He quit smoking ~1 year ago after trying hypnotism.   ED course: bradycardic to the 50s, mildly hypertensive, VSS stable. Labs notable for K 3.6, Cr 1.08 (baseline), Hgb 12.9 (baseline), PLT 209, Trop negative x2. EKG with sinus rhythm with non-specific T wave abnormalities seen on prior; no STE/D. CXR without acute findings. Cardiology asked to evaluate for unstable angina.  Wolfe Course     Consultants:   CAD He was admitted to cardiology and taken to the cath lab. Heart cath on 05/27/18 with significant native multivessel disease, but patent LIMA to LAD, patient ostial stent in the sequential vein graft to the ramus and OM, and occluded vein graft to PDA.  DAPT with ASA and brilinta again recommended. Plan to increase anti-anginals. Increased imdur to 60 mg, continue lopressor and norvasc. Home lisinopril was held to titrate anti-anginals. May be able to add back in OV.    LDL goal less than 70 He has been intolerant to lipitor and crestor. Will message to make a referral to lipid clinic for PCSK9 inhibitor.    Hypertension Pressures well-controlled. Medications as above.    AAA Needs follow up ultrasound after discharge.   Pt seen and examined by Dr. Tenny Craw and deemed stable for discharge. Follow up with be arranged.  _____________  Discharge Vitals Blood pressure 133/67, pulse 63, temperature 97.8 F (36.6 C), temperature source Oral, resp. rate 17, height 5\' 10"  (1.778 m), weight 104.9 kg, SpO2 98 %.  Filed Weights   05/26/18 1845 05/27/18 0518 05/28/18 0548  Weight: 104.6 kg 103.9 kg 104.9 kg    Labs & Radiologic Studies    CBC Recent Labs    05/27/18 1830 05/28/18 0418  WBC 8.1 8.4  HGB 13.4 11.8*  HCT 40.5 36.3*    MCV 89.6 90.5  PLT 201 177   Basic Metabolic Panel Recent Labs    16/10/96 1342  05/27/18 0603 05/27/18 1830  NA 139  --  140  --   K 3.6  --  3.7  --   CL 104  --  104  --   CO2 23  --  25  --   GLUCOSE 146*  --  134*  --   BUN 17  --  16  --   CREATININE 1.08   < > 0.99 0.88  CALCIUM 9.5  --  9.1  --    < > = values in this interval not displayed.   Liver Function Tests No results for input(s): AST, ALT, ALKPHOS, BILITOT, PROT, ALBUMIN in the last 72 hours. No results for input(s): LIPASE, AMYLASE in the last 72 hours. Cardiac Enzymes No results for input(s): CKTOTAL, CKMB, CKMBINDEX, TROPONINI in the last 72 hours. BNP Invalid input(s): POCBNP D-Dimer No results for input(s): DDIMER in the last 72 hours. Hemoglobin A1C No results for input(s): HGBA1C in the last 72  hours. Fasting Lipid Panel No results for input(s): CHOL, HDL, LDLCALC, TRIG, CHOLHDL, LDLDIRECT in the last 72 hours. Thyroid Function Tests No results for input(s): TSH, T4TOTAL, T3FREE, THYROIDAB in the last 72 hours.  Invalid input(s): FREET3 _____________  Dg Chest 2 View  Result Date: 05/26/2018 CLINICAL DATA:  Midsternal chest pain. EXAM: CHEST - 2 VIEW COMPARISON:  09/12/2015 FINDINGS: Both lungs are clear. Negative for a pneumothorax. Small focus of sclerosis involving the anterior left third rib appears stable. Heart size is normal. Prior CABG procedure. Sternal wires appear to be intact. No large pleural effusions. Stable round lucency involving the proximal right humerus. IMPRESSION: No active cardiopulmonary disease. Electronically Signed   By: Richarda Overlie M.D.   On: 05/26/2018 13:57   Disposition   Pt is being discharged home today in good condition.  Follow-up Plans & Appointments    Follow-up Information    Lewayne Bunting, MD Follow up in 2 week(s).   Specialty:  Cardiology Contact information: [address] [place] [address] [phone]          Discharge  Instructions    Diet - low sodium heart healthy   Complete by:  As directed    Discharge instructions   Complete by:  As directed    No driving for 2 days. No lifting over 5 lbs for 1 week. No sexual activity for 1 week. You may return to work in 1 week. Keep procedure site clean & dry. If you notice increased pain, swelling, bleeding or pus, call/return!  You may shower, but no soaking baths/hot tubs/pools for 1 week.   Increase activity slowly   Complete by:  As directed       Discharge Medications   Allergies as of 05/28/2018      Reactions   Neosporin [neomycin-bacitracin Zn-polymyx] Dermatitis   Codeine Nausea And Vomiting      Medication List    STOP taking these medications   lisinopril 10 MG tablet Commonly known as:  PRINIVIL,ZESTRIL     TAKE these medications   albuterol 108 (90 Base) MCG/ACT inhaler Commonly known as:  PROVENTIL HFA;VENTOLIN HFA Inhale 2 puffs into the lungs every 4 (four) hours as needed for wheezing or shortness of breath (cough, shortness of breath or wheezing.).   amLODipine 10 MG tablet Commonly known as:  NORVASC Take 1 tablet (10 mg total) by mouth daily.   aspirin EC 81 MG tablet Take 81 mg by mouth daily.   cetirizine 10 MG tablet Commonly known as:  ZYRTEC Take 10 mg by mouth daily.   diphenhydramine-acetaminophen 25-500 MG Tabs tablet Commonly known as:  TYLENOL PM Take 2 tablets by mouth at bedtime.   isosorbide mononitrate 60 MG 24 hr tablet Commonly known as:  IMDUR Take 1 tablet (60 mg total) by mouth daily. Start taking on:  05/29/2018   metoprolol succinate 25 MG 24 hr tablet Commonly known as:  TOPROL-XL Take 1 tablet (25 mg total) by mouth daily. Take with or immediately following a meal.   nitroGLYCERIN 0.4 MG SL tablet Commonly known as:  NITROSTAT Place 1 tablet (0.4 mg total) under the tongue every 5 (five) minutes as needed for chest pain.   omeprazole 20 MG capsule Commonly known as:  PRILOSEC Take 1  capsule (20 mg total) by mouth daily.   ticagrelor 90 MG Tabs tablet Commonly known as:  BRILINTA Take 1 tablet (90 mg total) by mouth 2 (two) times daily.  Acute coronary syndrome (MI, NSTEMI, STEMI, etc) this admission?: No.    Outstanding Labs/Studies   Needs lipid clinic  AAA [place]  Duration of Discharge Encounter   Greater than 30 minutes including physician time.  Signed, Roe Rutherford Merced Hanners, PA 05/28/2018, 9:50 AM

## 2018-05-28 NOTE — Progress Notes (Signed)
Pt asking to drive self home. Pt states his car is at hospital. Pt educated pt's are normally not allowed to drive home after a cath. RN will ask MD when rounding as pt states he is supposed to go home this AM.

## 2018-05-30 ENCOUNTER — Other Ambulatory Visit: Payer: Self-pay | Admitting: *Deleted

## 2018-05-30 ENCOUNTER — Telehealth: Payer: Self-pay | Admitting: Cardiology

## 2018-05-30 ENCOUNTER — Encounter: Payer: Self-pay | Admitting: *Deleted

## 2018-05-30 MED FILL — ISOSORBIDE MN ER 60 MG TAB: 60 | 90 days supply | Qty: 90 | Fill #0

## 2018-05-30 MED FILL — Verapamil HCl IV Soln 2.5 MG/ML: INTRAVENOUS | Qty: 2 | Status: AC

## 2018-05-30 NOTE — Telephone Encounter (Signed)
New message   Patient wife wants to know if he is cleared to go to work tomorrow. Please call patient to discuss.

## 2018-05-30 NOTE — Telephone Encounter (Signed)
Ok to return to work [name]  

## 2018-05-30 NOTE — Telephone Encounter (Signed)
Spoke with pt, Aware of dr crenshaw's recommendations.  °

## 2018-05-30 NOTE — Patient Outreach (Signed)
[facility]) Care Management  05/30/2018  [name] [birth year] [id number]   Subjective: Telephone call to patient's home  number, spoke with patient, and HIPAA verified.  Discussed [facility] Care Management [facility] Transition of care follow up, patient voiced understanding, and is in agreement to follow up.   Patient states he is doing good, feeling fine, waiting for cardiologist office to call with follow up appointment date, and set up new cholesterol medication.   States he plans to return to work on 05/31/18.   States he had Valium post procedure in the hospital,  it really assisted with his comfort level (has longstanding back issues), and getting through the 4 hours prone position post procedure.  Patient states he is able to manage self care and has assistance as needed.  Patient voices understanding of medical diagnosis and treatment plan.    States he is accessing the following [facility] benefits: outpatient pharmacy, hospital indemnity (verbally given contact number for Leonie Douglas [phone], will ask his wife to verify benefits, will file claim if appropriate, verbally given contact number for [facility] [phone] to request itemized bill) and  family medical leave act (FMLA) not needed.  Patient states he does not have any education material, transition of care, care coordination, disease management, disease monitoring, transportation, community resource, or pharmacy needs at this time.   States he is very appreciative of the follow up and is in agreement to receive [facility] Care Management information.       Objective: Per KPN (Knowledge Performance Now, point of care tool) and chart review, patient hospitalized 05/26/18 -05/28/18 for unstable angina.   Patient has a history of Asthma, COPD, Coronary artery disease, Hyperlipidemia, hypertension, Myocardial infarction, prediabetes,  and Peripheral vascular disease.      Assessment:   Received [facility]MR Transition of care  referral on 05/30/18.  Transition of care follow up completed, no care management needs, and will proceed with case closure.       Plan: RNCM will send patient successful outreach letter, [facility] pamphlet, and magnet. RNCM will complete case closure due to follow up completed / no care management needs.         Texie Tupou H. Gardiner Barefoot, BSN, CCM [facility] Care Management [facility] Telephonic CM Phone: [phone] Fax: [phone]

## 2018-05-30 NOTE — Telephone Encounter (Signed)
Patient had LHC 05/27/18 Please advise

## 2018-06-03 ENCOUNTER — Other Ambulatory Visit: Payer: Self-pay | Admitting: Physician Assistant

## 2018-06-03 ENCOUNTER — Other Ambulatory Visit: Payer: Self-pay | Admitting: Adult Health

## 2018-06-03 DIAGNOSIS — K219 Gastro-esophageal reflux disease without esophagitis: Secondary | ICD-10-CM

## 2018-06-03 MED FILL — OMEPRAZOLE 20 MG CPDR: 20 | 30 days supply | Qty: 30 | Fill #0

## 2018-06-09 ENCOUNTER — Encounter: Payer: Self-pay | Admitting: Cardiology

## 2018-06-09 ENCOUNTER — Ambulatory Visit: Payer: 59 | Admitting: Cardiology

## 2018-06-09 DIAGNOSIS — Z9861 Coronary angioplasty status: Secondary | ICD-10-CM

## 2018-06-09 DIAGNOSIS — I10 Essential (primary) hypertension: Secondary | ICD-10-CM | POA: Diagnosis not present

## 2018-06-09 DIAGNOSIS — Z951 Presence of aortocoronary bypass graft: Secondary | ICD-10-CM | POA: Diagnosis not present

## 2018-06-09 DIAGNOSIS — I2510 Atherosclerotic heart disease of native coronary artery without angina pectoris: Secondary | ICD-10-CM

## 2018-06-09 DIAGNOSIS — E785 Hyperlipidemia, unspecified: Secondary | ICD-10-CM

## 2018-06-09 MED ORDER — ISOSORBIDE MONONITRATE ER 30 MG PO TB24: 30.0000 mg | ORAL_TABLET | Freq: Every day | ORAL | 1 refills | Status: DC

## 2018-06-09 MED ORDER — LOSARTAN POTASSIUM 25 MG PO TABS: 25.0000 mg | ORAL_TABLET | Freq: Every evening | ORAL | 1 refills | Status: DC

## 2018-06-09 MED FILL — ISOSORBIDE MN ER 30 MG TAB: 30 | 90 days supply | Qty: 90 | Fill #0

## 2018-06-09 MED FILL — LOSARTAN POTASSIUM 25 MG TA: 25 | 90 days supply | Qty: 90 | Fill #0

## 2018-06-09 NOTE — Patient Instructions (Addendum)
Medication Instructions:  DECREASE Imdur 30mg  Take 1 tablet once a day START Losartan 25mg  Take 1 tablet daily at bedtime If you need a refill on your cardiac medications before your next appointment, please call your pharmacy.  Labwork: None  Testing/Procedures: None   Follow-Up: FOLLOW UP AS SCHEDULED REFERRAL to lipid clinic Any Other Special Instructions Will Be Listed Below (If Applicable).

## 2018-06-09 NOTE — Assessment & Plan Note (Signed)
CABG 2007 x 3 with an LIMA-LAD, SVG-PDA, SVG-OM1

## 2018-06-09 NOTE — Progress Notes (Signed)
06/09/2018 [name]   1951/04/12  [id number]  Primary Physician Sherren Mocha, MD Primary Cardiologist: Dr [name]  HPI:  Pleasant 67 y/o male followed by Dr [name] with a history of CAD. He works as a Customer service manager. He has a history of CAD, s/p CABG x 3 with an LIMA-LAD, SVG-OM1, SVG-PDA. In May 2019 he had a PCI with DES to the SVG-OM1. He was recently admitted 05/26/18 with [place] (lt arm pain with exertion). Re look cath revealed a patent LIMA-LAD, occluded SVG-PDA, no ISR of the SVG- OM. The plan is for medical Rx. Imdur 60 mg was added. He is in the office today for follow up. He denies chest pain but has had a headache from the Imdur.    Current Outpatient Medications  Medication Sig Dispense Refill  . albuterol (PROVENTIL HFA;VENTOLIN HFA) 108 (90 Base) MCG/ACT inhaler Inhale 2 puffs into the lungs every 4 (four) hours as needed for wheezing or shortness of breath (cough, shortness of breath or wheezing.). 1 Inhaler 0  . amLODipine (NORVASC) 10 MG tablet Take 1 tablet (10 mg total) by mouth daily. 90 tablet 1  . aspirin EC 81 MG tablet Take 81 mg by mouth daily.    . cetirizine (ZYRTEC) 10 MG tablet Take 10 mg by mouth daily.     . diphenhydramine-acetaminophen (TYLENOL PM) 25-500 MG TABS tablet Take 2 tablets by mouth at bedtime.    . isosorbide mononitrate (IMDUR) 30 MG 24 hr tablet Take 1 tablet (30 mg total) by mouth daily. 90 tablet 1  . metoprolol succinate (TOPROL-XL) 25 MG 24 hr tablet TAKE 1 TABLET (25 MG TOTAL) BY MOUTH DAILY. TAKE WITH OR IMMEDIATELY FOLLOWING A MEAL. 90 tablet 1  . nitroGLYCERIN (NITROSTAT) 0.4 MG SL tablet Place 1 tablet (0.4 mg total) under the tongue every 5 (five) minutes as needed for chest pain. 25 tablet 2  . omeprazole (PRILOSEC) 20 MG capsule TAKE 1 CAPSULE BY MOUTH DAILY. 30 capsule 2  . ticagrelor (BRILINTA) 90 MG TABS tablet Take 1 tablet (90 mg total) by mouth 2 (two) times daily. 180 tablet 3  . losartan (COZAAR) 25  MG tablet Take 1 tablet (25 mg total) by mouth every evening. 90 tablet 1   No current facility-administered medications for this visit.     Allergies  Allergen Reactions  . Lipitor [Atorvastatin Calcium]     Leg cramps  . Neosporin [Neomycin-Bacitracin Zn-Polymyx] Dermatitis  . Codeine Nausea And Vomiting    Past Medical History:  Diagnosis Date  . Arthritis   . Asthma    occ bronchitis  . Bronchitis   . COPD (chronic obstructive pulmonary disease) (HCC)   . Coronary artery disease    a. s/p multiple prior PCIs; b. s/p CABG 06/2006 (L-LAD, S-Int and OM, S-PDA);  c. ETT-Myoview 4/13: low risk, no ischemia, EF 53%  . GERD (gastroesophageal reflux disease)   . Hx of echocardiogram    Echo 10/2006: normal LVF  . Hyperlipidemia   . Hypertension   . Myocardial infarction (HCC)   . Peripheral vascular disease (HCC)   . Seasonal allergies   . Wears dentures   . Wears glasses     Social History   Socioeconomic History  . Marital status: Married    Spouse name: Not on file  . Number of children: 3  . Years of education: Not on file  . Highest education level: Not on file  Occupational History  . Occupation:  RETAIL  Social Needs  . Financial resource strain: Not on file  . Food insecurity:    Worry: Not on file    Inability: Not on file  . Transportation needs:    Medical: Not on file    Non-medical: Not on file  Tobacco Use  . Smoking status: Former Smoker    Packs/day: 1.00    Types: Cigarettes    Last attempt to quit: 03/22/2017    Years since quitting: 1.2  . Smokeless tobacco: Never Used  Substance and Sexual Activity  . Alcohol use: Yes    Alcohol/week: 0.0 standard drinks    Comment: occasional - BEER OR WINE  . Drug use: No  . Sexual activity: Not on file  Lifestyle  . Physical activity:    Days per week: Not on file    Minutes per session: Not on file  . Stress: Not on file  Relationships  . Social connections:    Talks on phone: Not on file     Gets together: Not on file    Attends religious service: Not on file    Active member of club or organization: Not on file    Attends meetings of clubs or organizations: Not on file    Relationship status: Not on file  . Intimate partner violence:    Fear of current or ex partner: Not on file    Emotionally abused: Not on file    Physically abused: Not on file    Forced sexual activity: Not on file  Other Topics Concern  . 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      Family History  Problem Relation Age of Onset  . Coronary artery disease Father        brothers, sisters  . Heart disease Father   . Diabetes Mother   . Heart disease Sister   . Heart disease Sister   . Heart disease Brother   . Anesthesia problems Neg Hx      Review of Systems: General: negative for chills, fever, night sweats or weight changes.  Cardiovascular: negative for chest pain, dyspnea on exertion, edema, orthopnea, palpitations, paroxysmal nocturnal dyspnea or shortness of breath Dermatological: negative for rash Respiratory: negative for cough or wheezing Urologic: negative for hematuria Abdominal: negative for nausea, vomiting, diarrhea, bright red blood per rectum, melena, or hematemesis Neurologic: negative for visual changes, syncope, or dizziness Headache All other systems reviewed and are otherwise negative except as noted above.    Blood pressure (!) 146/78, height 5\' 10"  (1.778 m), weight 233 lb (105.7 kg).  General appearance: alert, cooperative and no distress Neck: no carotid bruit and no JVD Lungs: clear to auscultation bilaterally Heart: regular rate and rhythm Extremities: no edema Skin: warm and dry Neurologic: Grossly normal  EKG NSR-HR 70  ASSESSMENT AND PLAN:   CAD S/P percutaneous coronary angioplasty S/P SVG-OM1 PCI with DES May 2019 Re look cath for chest pain 05/27/18-  medical rx  Hx of CABG CABG 2007 x 3 with an LIMA-LAD, SVG-PDA, SVG-OM1  Hyperlipidemia LDL goal <70 Statin intolerance-leg cramps with Lipitor and Crestor Refer to lipid Clinic  Essential hypertension Sub optimal control- repeat by me 140/70. This is consistent with his home B/P readings. He has been on Lisinopril in the past but it was later stopped because his B/P was "too low". I would like to try low dose  Losartan Q pm.    PLAN  Decrease Imdur to 30 mg, add Losartan 25 mg, refer to Lipid Clinic. Keep f/u with Dr [name] in Nov.   Corine Shelter PA-C 06/09/2018 10:04 AM

## 2018-06-09 NOTE — Assessment & Plan Note (Signed)
S/P SVG-OM1 PCI with DES May 2019 Re look cath for chest pain 05/27/18- medical rx

## 2018-06-09 NOTE — Assessment & Plan Note (Signed)
Sub optimal control- repeat by me 140/70. This is consistent with his home B/P readings. He has been on Lisinopril in the past but it was later stopped because his B/P was "too low". I would like to try low dose Losartan Q pm.

## 2018-06-09 NOTE — Assessment & Plan Note (Signed)
Statin intolerance-leg cramps with Lipitor and Crestor Refer to lipid Clinic

## 2018-06-10 ENCOUNTER — Telehealth: Payer: Self-pay | Admitting: *Deleted

## 2018-06-10 NOTE — Telephone Encounter (Signed)
Left message for patient to call and schedule Lipid Clinic appointment with Pharmacist

## 2018-06-13 ENCOUNTER — Telehealth: Payer: Self-pay | Admitting: *Deleted

## 2018-06-13 NOTE — Telephone Encounter (Signed)
Spoke with pt wife, patient is due for a CTA to follow up AAA. The wife reports they will discuss at his follow up with dr [name] 07/26/2018 and will likely put off testing until the first of the year due to finances.

## 2018-06-14 ENCOUNTER — Ambulatory Visit (INDEPENDENT_AMBULATORY_CARE_PROVIDER_SITE_OTHER): Payer: 59 | Admitting: Vascular Surgery

## 2018-06-14 ENCOUNTER — Ambulatory Visit (HOSPITAL_COMMUNITY)
Admission: RE | Admit: 2018-06-14 | Discharge: 2018-06-14 | Disposition: A | Discharge status: Home / Self Care | Payer: 59 | Source: Ambulatory Visit | Attending: Vascular Surgery | Admitting: Vascular Surgery

## 2018-06-14 ENCOUNTER — Encounter: Payer: Self-pay | Admitting: Vascular Surgery

## 2018-06-14 ENCOUNTER — Ambulatory Visit (INDEPENDENT_AMBULATORY_CARE_PROVIDER_SITE_OTHER)
Admission: RE | Admit: 2018-06-14 | Discharge: 2018-06-14 | Disposition: A | Discharge status: Home / Self Care | Payer: 59 | Source: Ambulatory Visit | Attending: Vascular Surgery | Admitting: Vascular Surgery

## 2018-06-14 VITALS — BP 136/71 | HR 66 | Temp 96.8°F | Resp 18 | Ht 70.0 in | Wt 230.0 lb

## 2018-06-14 DIAGNOSIS — I70213 Atherosclerosis of native arteries of extremities with intermittent claudication, bilateral legs: Secondary | ICD-10-CM

## 2018-06-14 DIAGNOSIS — I779 Disorder of arteries and arterioles, unspecified: Secondary | ICD-10-CM

## 2018-06-14 NOTE — Progress Notes (Signed)
Patient name: [name] MRN: [id number] DOB: February 20, 1951 Sex: male  REASON FOR VISIT: 79-month follow-up after a remote right common femoral to AK pop bypass  HPI: EDINSON Wolfe is a 67 y.o. male that presents for interval 46-month follow-up after right common femoral to above-knee popliteal bypass on 06/28/2017 for lifestyle limiting claudication by Dr. Imogene Burn with non-reversed GSV.  Patient reports no new issues in the interim.  He has no pain in his right leg including no rest pain symptoms and no claudication symptoms.  He is still working.  He stopped smoking in the past.  On his previous duplex there was an area in the distal bypass concerning for 50 to 75% stenosis and Dr. Imogene Burn had recommended a potential intervention.  No symptoms in left leg either.  Past Medical History:  Diagnosis Date  . Arthritis   . Asthma    occ bronchitis  . Bronchitis   . COPD (chronic obstructive pulmonary disease) (HCC)   . Coronary artery disease    a. s/p multiple prior PCIs; b. s/p CABG 06/2006 (L-LAD, S-Int and OM, S-PDA);  c. ETT-Myoview 4/13: low risk, no ischemia, EF 53%  . GERD (gastroesophageal reflux disease)   . Hx of echocardiogram    Echo 10/2006: normal LVF  . Hyperlipidemia   . Hypertension   . Myocardial infarction (HCC)   . Peripheral vascular disease (HCC)   . Seasonal allergies   . Wears dentures   . Wears glasses     Past Surgical History:  Procedure Laterality Date  . ABDOMINAL AORTOGRAM N/A 04/28/2017   Procedure: ABDOMINAL AORTOGRAM;  Surgeon: Iran Ouch, MD;  Location: [facility];  Service: Cardiovascular;  Laterality: N/A;  . ACNE CYST REMOVAL    . CARDIAC CATHETERIZATION    . COLONOSCOPY    . CORONARY ARTERY BYPASS GRAFT  2007  . CORONARY STENT INTERVENTION  01/05/2018  . CORONARY STENT INTERVENTION N/A 01/05/2018   Procedure: CORONARY STENT INTERVENTION;  Surgeon: Lennette Bihari, MD;  Location: [facility]AS[facility];  Service: Cardiovascular;   Laterality: N/A;  . CORONARY STENT PLACEMENT     x5 prior to cabg  . FEMORAL-POPLITEAL BYPASS GRAFT Right 06/28/2017   Procedure: BYPASS GRAFT COMMON FEMORAL- ABOVE KNEE POPLITEAL ARTERY WITH LEFT GREATER SAPHENOUS VEIN;  Surgeon: Fransisco Hertz, MD;  Location: [facility] OR;  Service: Vascular;  Laterality: Right;  . KNEE ARTHROSCOPY     rt knee  . LEFT HEART CATH AND CORS/GRAFTS ANGIOGRAPHY N/A 01/05/2018   Procedure: LEFT HEART CATH AND CORS/GRAFTS ANGIOGRAPHY;  Surgeon: Lennette Bihari, MD;  Location: [facility];  Service: Cardiovascular;  Laterality: N/A;  . LEFT HEART CATH AND CORS/GRAFTS ANGIOGRAPHY N/A 05/27/2018   Procedure: LEFT HEART CATH AND CORS/GRAFTS ANGIOGRAPHY;  Surgeon: Lennette Bihari, MD;  Location: [facility];  Service: Cardiovascular;  Laterality: N/A;  . LOWER EXTREMITY ANGIOGRAPHY Bilateral 04/28/2017   Procedure: Lower Extremity Angiography;  Surgeon: Iran Ouch, MD;  Location: [facility];  Service: Cardiovascular;  Laterality: Bilateral;  . MULTIPLE TOOTH EXTRACTIONS    . SHOULDER ARTHROSCOPY  12/14/2011   removed bone spurs and repositioned muscle.   . TONSILLECTOMY      Family History  Problem Relation Age of Onset  . Coronary artery disease Father        brothers, sisters  . Heart disease Father   . Diabetes Mother   . Heart disease Sister   . Heart  disease Sister   . Heart disease Brother   . Anesthesia problems Neg Hx     SOCIAL HISTORY: Social History   Tobacco Use  . Smoking status: Former Smoker    Packs/day: 1.00    Types: Cigarettes    Last attempt to quit: 03/22/2017    Years since quitting: 1.2  . Smokeless tobacco: Never Used  Substance Use Topics  . Alcohol use: Yes    Alcohol/week: 0.0 standard drinks    Comment: occasional - BEER OR WINE    Allergies  Allergen Reactions  . Lipitor [Atorvastatin Calcium]     Leg cramps  . Neosporin [Neomycin-Bacitracin Zn-Polymyx] Dermatitis  . Codeine Nausea And Vomiting     Current Outpatient Medications  Medication Sig Dispense Refill  . albuterol (PROVENTIL HFA;VENTOLIN HFA) 108 (90 Base) MCG/ACT inhaler Inhale 2 puffs into the lungs every 4 (four) hours as needed for wheezing or shortness of breath (cough, shortness of breath or wheezing.). 1 Inhaler 0  . amLODipine (NORVASC) 10 MG tablet Take 1 tablet (10 mg total) by mouth daily. 90 tablet 1  . aspirin EC 81 MG tablet Take 81 mg by mouth daily.    . cetirizine (ZYRTEC) 10 MG tablet Take 10 mg by mouth daily.     . diphenhydramine-acetaminophen (TYLENOL PM) 25-500 MG TABS tablet Take 2 tablets by mouth at bedtime.    . isosorbide mononitrate (IMDUR) 30 MG 24 hr tablet Take 1 tablet (30 mg total) by mouth daily. 90 tablet 1  . losartan (COZAAR) 25 MG tablet Take 1 tablet (25 mg total) by mouth every evening. 90 tablet 1  . metoprolol succinate (TOPROL-XL) 25 MG 24 hr tablet TAKE 1 TABLET (25 MG TOTAL) BY MOUTH DAILY. TAKE WITH OR IMMEDIATELY FOLLOWING A MEAL. 90 tablet 1  . nitroGLYCERIN (NITROSTAT) 0.4 MG SL tablet Place 1 tablet (0.4 mg total) under the tongue every 5 (five) minutes as needed for chest pain. 25 tablet 2  . omeprazole (PRILOSEC) 20 MG capsule TAKE 1 CAPSULE BY MOUTH DAILY. 30 capsule 2  . ticagrelor (BRILINTA) 90 MG TABS tablet Take 1 tablet (90 mg total) by mouth 2 (two) times daily. 180 tablet 3   No current facility-administered medications for this visit.     REVIEW OF SYSTEMS:  [X]  denotes positive finding, [ ]  denotes negative finding Cardiac  Comments:  Chest pain or chest pressure:    Shortness of breath upon exertion:    Short of breath when lying flat:    Irregular heart rhythm:        Vascular    Pain in calf, thigh, or hip brought on by ambulation:  Denies  Pain in feet at night that wakes you up from your sleep:   Denies  Blood clot in your veins:    Leg swelling:   Denies      Pulmonary    Oxygen at home:    Productive cough:     Wheezing:         Neurologic     Sudden weakness in arms or legs:     Sudden numbness in arms or legs:     Sudden onset of difficulty speaking or slurred speech:    Temporary loss of vision in one eye:     Problems with dizziness:         Gastrointestinal    Blood in stool:     Vomited blood:         Genitourinary  Burning when urinating:     Blood in urine:        Psychiatric    Major depression:         Hematologic    Bleeding problems:    Problems with blood clotting too easily:        Skin    Rashes or ulcers:        Constitutional    Fever or chills:      PHYSICAL EXAM: Vitals:   06/14/18 1107  BP: 136/71  Pulse: 66  Resp: 18  Temp: (!) 96.8 F (36 C)  TempSrc: Oral  SpO2: 97%  Weight: 104.3 kg  Height: 5\' 10"  (1.778 m)    GENERAL: The patient is a well-nourished male, in no acute distress. The vital signs are documented above. CARDIAC: There is a regular rate and rhythm.  VASCULAR:  2+ radial pulse palpable BUE 2+ femoral pulse palpable bilateral groins Left leg vein harvest incisions well healed Right groin and AK pop incision well healed Right DP palpable Foot warm, no motor or sensory deficit PULMONARY: There is good air exchange bilaterally without wheezing or rales. ABDOMEN: Soft and non-tender with normal pitched bowel sounds.  MUSCULOSKELETAL: There are no major deformities or cyanosis. NEUROLOGIC: No focal weakness or paresthesias are detected. SKIN: There are no ulcers or rashes noted. PSYCHIATRIC: The patient has a normal affect.  DATA:   I independently reviewed his noninvasive imaging this shows ABI of 1.06 in the right leg and 0.99 in left leg with a widely patent bypass.  Outflow velocity of 187 not significant for intervention.   Assessment/Plan:  Overall Mr. Alicea appears to be doing very well.  His bypass is patent on today's duplex with no significant stenosis or velocity and normal ABI's with triphasic waveform at ankle.  On exam he has a palpable dorsalis  pedis pulse in the right foot.  Discussed given that he is doing so well we can stretch his follow-up out to 6 months.  I will plan to see him back in 6 months with ABI and arterial duplex of the right leg at that time.  He is doing all the right things including taking an aspirin, he does not smoke, and he is on a statin.   Cephus Shelling, MD [facility] Office: [phone] Pager: [phone]  Cephus Shelling

## 2018-06-14 NOTE — Telephone Encounter (Signed)
Left message for patient to call and schedule LIPID clinic appt with Pharmacist

## 2018-06-15 NOTE — Telephone Encounter (Signed)
Left message for patient to call and schedule LIPID clinic appointment

## 2018-06-16 NOTE — Telephone Encounter (Signed)
Sent email to patient requesting him to call and schedule CV[facility] appt for LIPIDS

## 2018-06-22 IMAGING — CT CT CTA ABD/PEL W/CM AND/OR W/O CM
2 of 6 series · 15 of 46 positions shown, 17 images · IV contrast (ISOVUE 370)
Comparison: 11/19/2015 ultrasound

CLINICAL DATA: Abdominal aortic atherosclerotic aneurysm by
ultrasound

EXAM:
CT ANGIOGRAPHY ABDOMEN AND PELVIS
TECHNIQUE: Multidetector CT imaging of the abdomen and pelvis was performed
using the standard protocol during bolus administration of
intravenous contrast. Multiplanar reconstructed images including
MIPs were obtained and reviewed to evaluate the vascular anatomy.
CONTRAST:  100 cc Isovue 370

[Series 5: arterial 3.0 i40f 2 · axial · arterial · 0.81mm/px · z∈[-513,-117]mm · 12 of 156 slices shown, 14 images]
[im 12/156  soft-tissue]
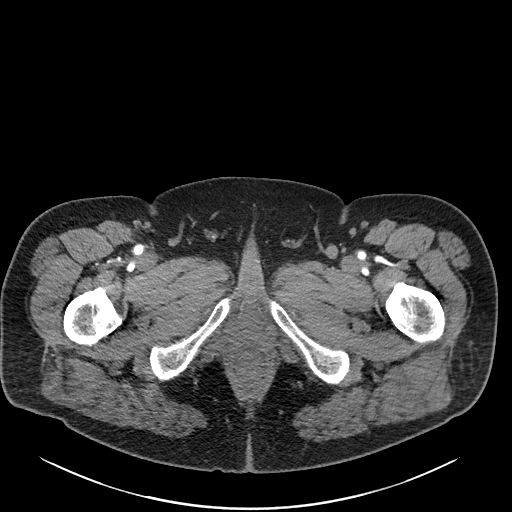
[im 12/156  bone]
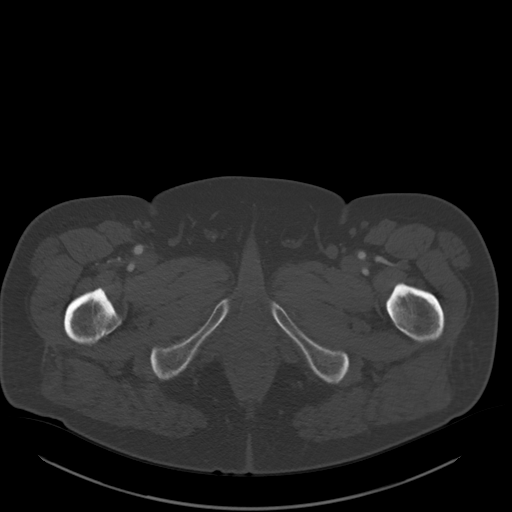
[im 24/156  soft-tissue]
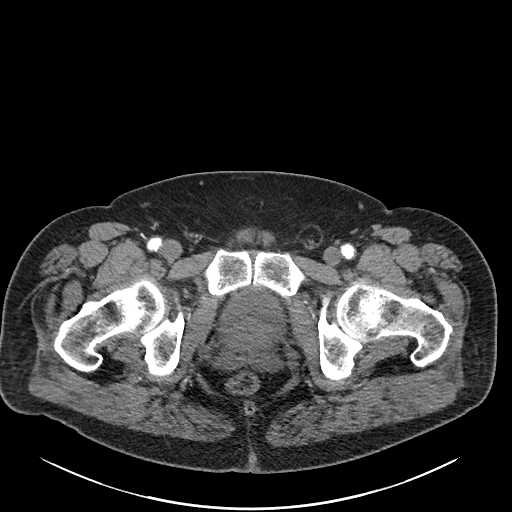
[im 36/156  soft-tissue]
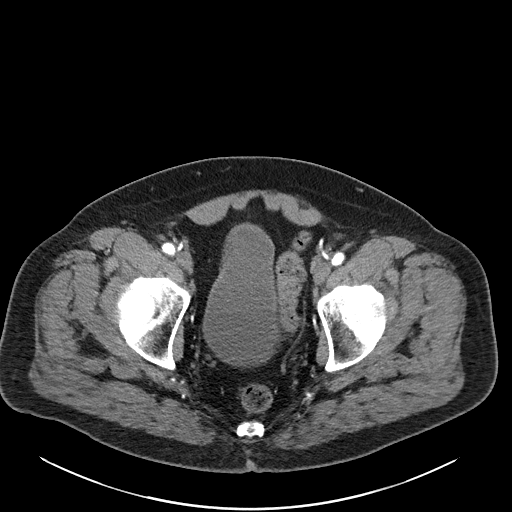
[im 48/156  soft-tissue]
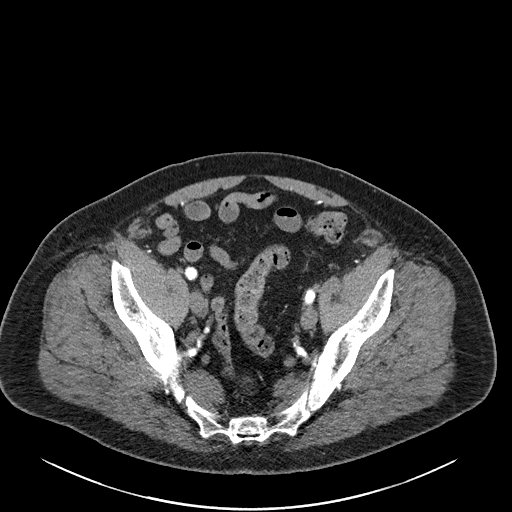
[im 60/156  soft-tissue]
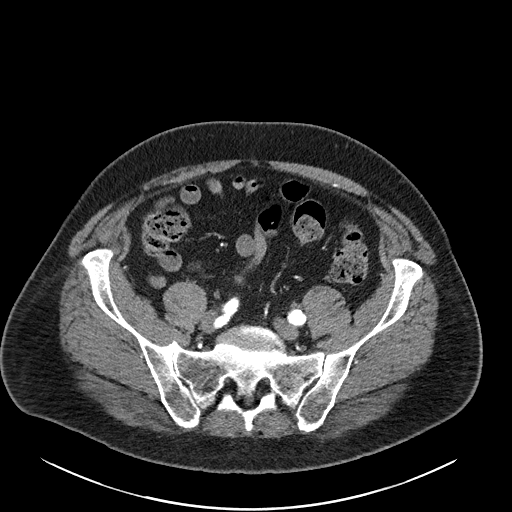
[im 72/156  soft-tissue]
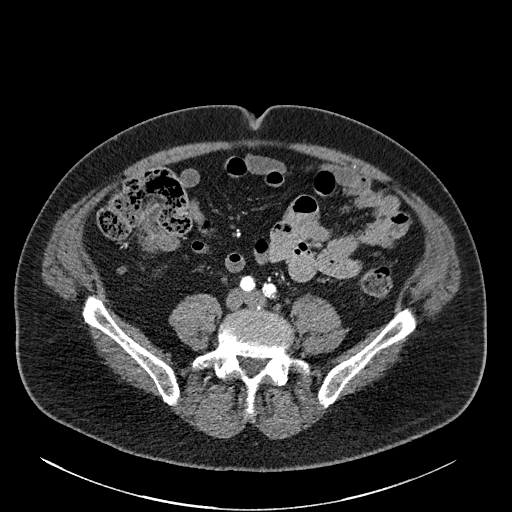
[im 84/156  soft-tissue]
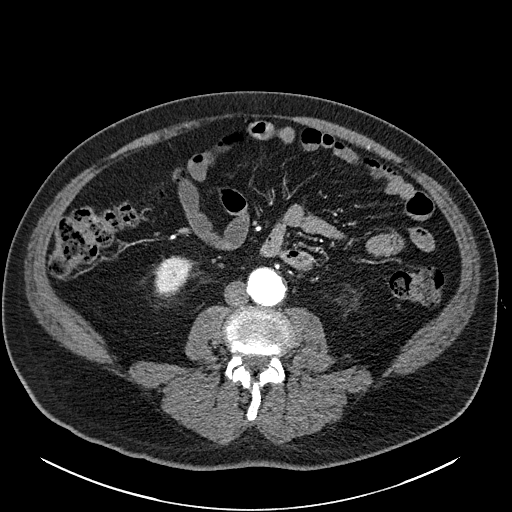
[im 96/156  soft-tissue]
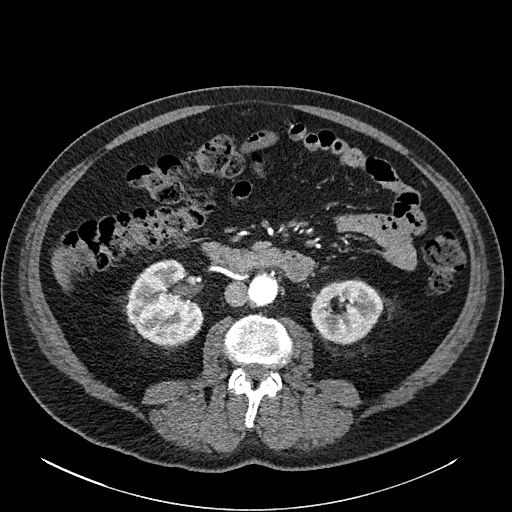
[im 108/156  soft-tissue]
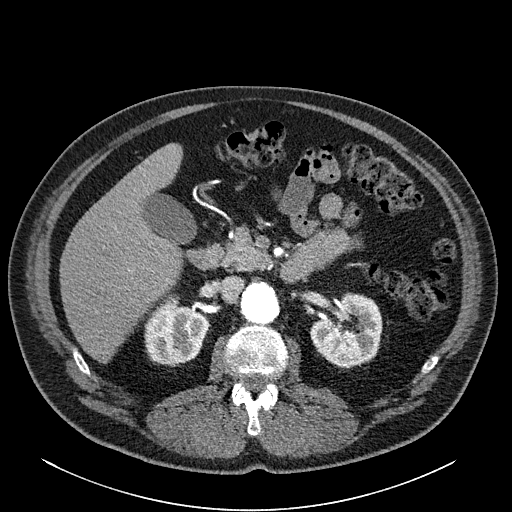
[im 108/156  bone]
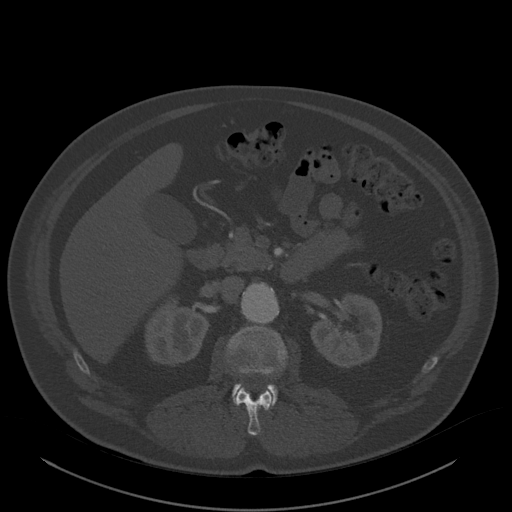
[im 120/156  soft-tissue]
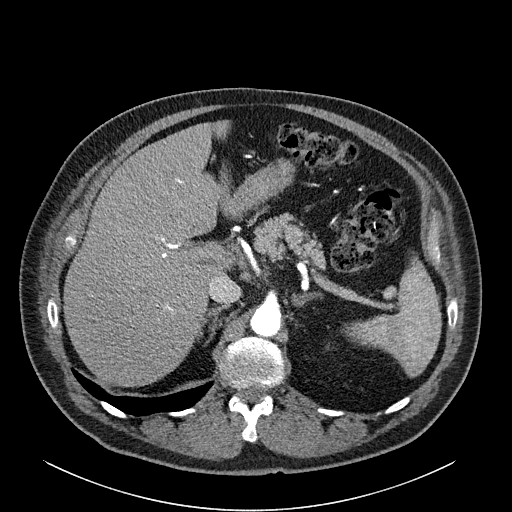
[im 132/156  soft-tissue]
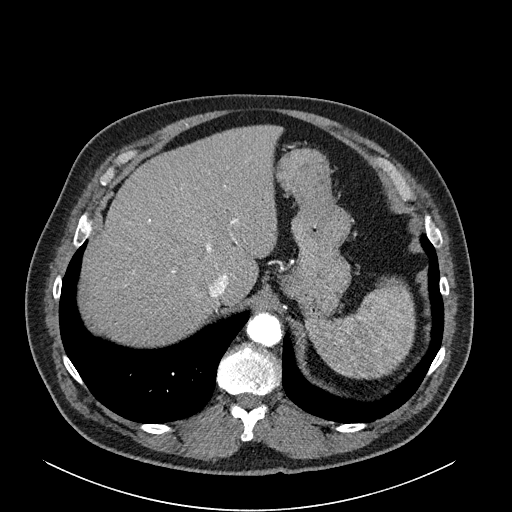
[im 144/156  soft-tissue]
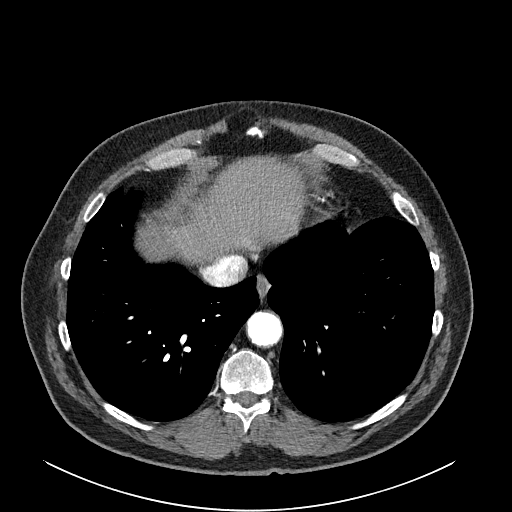

[Series 9: coronals · coronal · 0.78mm/px · 3 of 141 slices shown]
[im 36/141  soft-tissue]
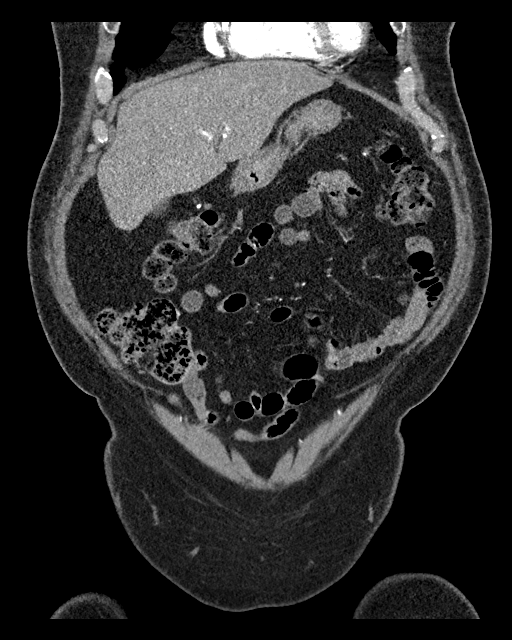
[im 71/141  soft-tissue]
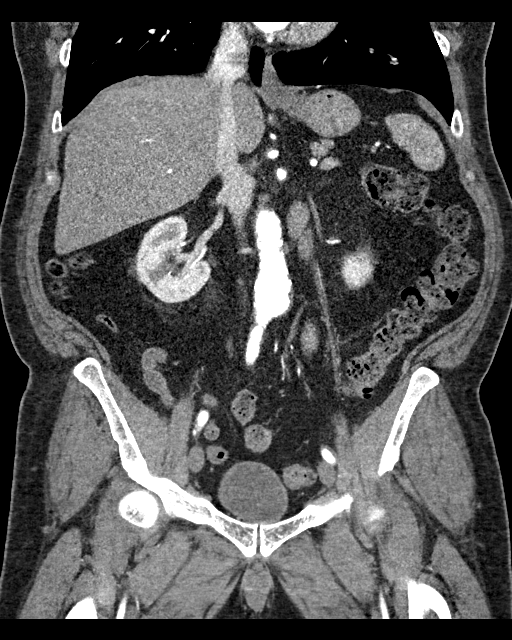
[im 106/141  soft-tissue]
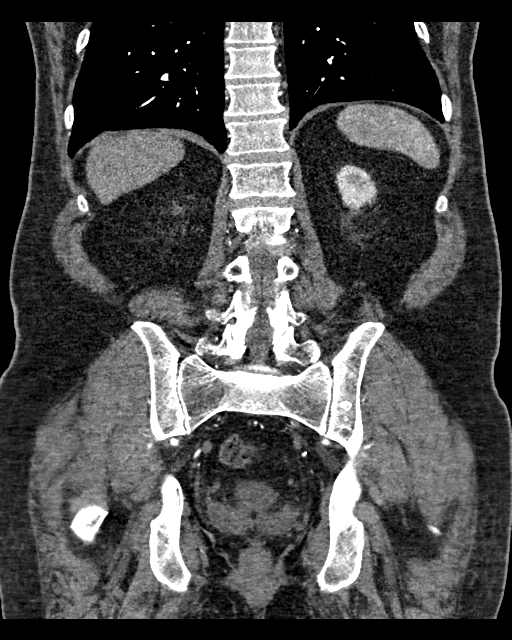

[15 of 46 positions shown; findings below may reference images not displayed]

FINDINGS: Arterial findings:

Aorta: Moderate diffuse atherosclerotic changes of the
thoracoabdominal aorta visualized. Infrarenal aorta demonstrates a
lobulated fusiform atherosclerotic small aneurysm, maximal AP
diameter 3.2 cm and transverse diameter 3.0 cm. No associated
dissection, occlusive process, retroperitoneal hemorrhage.

Celiac axis: Atherosclerotic origin but patent. Left gastric,
splenic, and hepatic vasculature remain patent.

Superior mesenteric: Atherosclerotic origin but widely patent.
Replaced right hepatic artery noted off of the SMA, at normal
variant.

Left renal: Atherosclerotic origin of the main left renal artery
with minor narrowing. Two accessory left renal arteries noted which
appear patent to the mid and lower pole regions.

Right renal: Atherosclerotic origin with minor narrowing of the main
left renal artery. Accessory renal artery to the lower pole also
patent.

Inferior mesenteric: IMA has atherosclerotic origin but remains
patent.

Left iliac: Left common, internal and external iliac arteries are
atherosclerotic with mild tortuosity but remain patent.

Right iliac: Right common, internal and external iliac arteries also
remain patent without aneurysm or occlusion. No iliac dissection.

Venous findings:      Venous imaging not performed

Review of the MIP images confirms the above findings.

Nonvascular findings: Lower chest: No acute finding.

Abdomen pelvis: Liver, gallbladder, biliary system demonstrate no
acute process by arterial phase imaging.

Pancreas, spleen, accessory splenule, and adrenal glands are within
normal limits for age and arterial phase imaging.

Kidneys demonstrate no acute obstruction, hydronephrosis, or
obstructing ureteral calculus on either side. Urinary bladder
unremarkable.

Negative for bowel obstruction, significant dilatation, ileus, or
free air. Normal appendix visualized. Scattered colonic
diverticulosis. No abdominal free fluid or fluid collection.

No inguinal abnormality or hernia.  Intact abdominal wall.

no acute osseous finding.
IMPRESSION: 3.2 cm infrarenal atherosclerotic abdominal aortic aneurysm by CTA.

Recommend followup by ultrasound in 3 years. This recommendation
follows ACR consensus guidelines: White Paper of the ACR Incidental
Findings Committee II on Vascular Findings. [HOSPITAL] 9904;

Atherosclerotic but patent mesenteric and renal vasculature.

Patent pelvic iliac vasculature without inflow disease or occlusion.

No other acute intra-abdominal pelvic finding.

## 2018-07-11 MED FILL — OMEPRAZOLE 20 MG CPDR: 20 | 30 days supply | Qty: 30 | Fill #1

## 2018-07-12 ENCOUNTER — Telehealth: Payer: Self-pay | Admitting: Cardiology

## 2018-07-12 NOTE — Telephone Encounter (Signed)
Per staff message patient needs an appt with Lipid clinic pharmacist. LVM to see if we could make the appt on 07/26/18 right before his visit with [facility] that day

## 2018-07-14 NOTE — Progress Notes (Signed)
HPI: FU CAD; s/p multiple prior PCIs, s/p subsequent CABG 06/2006 (LIMA to the LAD, sequential saphenous vein graft to the intermediate and obtuse marginal, and a saphenous vein graft to the PDA). Carotid Dopplers in April of 2005 showed normal arteries.  Abdominal aortogram August 2018 showed small abdominal aortic aneurysm.  Had PCI of the ostium of the vein graft supplying the OM1 May 2019.  Last cardiac catheterization September 2019 showed occluded right coronary artery, 40% left main, occluded first marginal, occluded third marginal, 45% LAD, 99% ostial first diagonal, patent LIMA to the LAD, patent sequential saphenous vein graft to the ramus and obtuse marginal with previously noted occluded sequential limb, occluded vein graft to the PDA.  Medical therapy recommended.  Echocardiogram September 2019 showed normal LV function, mild diastolic dysfunction and mild right atrial enlargement.  Since last seen there is no dyspnea, chest pain, palpitations or syncope.  Current Outpatient Medications  Medication Sig Dispense Refill  . albuterol (PROVENTIL HFA;VENTOLIN HFA) 108 (90 Base) MCG/ACT inhaler Inhale 2 puffs into the lungs every 4 (four) hours as needed for wheezing or shortness of breath (cough, shortness of breath or wheezing.). 1 Inhaler 0  . amLODipine (NORVASC) 10 MG tablet Take 1 tablet (10 mg total) by mouth daily. 90 tablet 1  . aspirin EC 81 MG tablet Take 81 mg by mouth daily.    . cetirizine (ZYRTEC) 10 MG tablet Take 10 mg by mouth daily.     . diphenhydramine-acetaminophen (TYLENOL PM) 25-500 MG TABS tablet Take 2 tablets by mouth at bedtime.    . isosorbide mononitrate (IMDUR) 30 MG 24 hr tablet Take 1 tablet (30 mg total) by mouth daily. 90 tablet 1  . losartan (COZAAR) 25 MG tablet Take 1 tablet (25 mg total) by mouth every evening. 90 tablet 1  . metoprolol succinate (TOPROL-XL) 25 MG 24 hr tablet TAKE 1 TABLET (25 MG TOTAL) BY MOUTH DAILY. TAKE WITH OR IMMEDIATELY  FOLLOWING A MEAL. 90 tablet 1  . nitroGLYCERIN (NITROSTAT) 0.4 MG SL tablet Place 1 tablet (0.4 mg total) under the tongue every 5 (five) minutes as needed for chest pain. 25 tablet 2  . omeprazole (PRILOSEC) 20 MG capsule TAKE 1 CAPSULE BY MOUTH DAILY. 30 capsule 2  . ticagrelor (BRILINTA) 90 MG TABS tablet Take 1 tablet (90 mg total) by mouth 2 (two) times daily. 180 tablet 3   No current facility-administered medications for this visit.      Past Medical History:  Diagnosis Date  . Arthritis   . Asthma    occ bronchitis  . Bronchitis   . COPD (chronic obstructive pulmonary disease) (HCC)   . Coronary artery disease    a. s/p multiple prior PCIs; b. s/p CABG 06/2006 (L-LAD, S-Int and OM, S-PDA);  c. ETT-Myoview 4/13: low risk, no ischemia, EF 53%  . GERD (gastroesophageal reflux disease)   . Hx of echocardiogram    Echo 10/2006: normal LVF  . Hyperlipidemia   . Hypertension   . Myocardial infarction (HCC)   . Peripheral vascular disease (HCC)   . Seasonal allergies   . Wears dentures   . Wears glasses     Past Surgical History:  Procedure Laterality Date  . ABDOMINAL AORTOGRAM N/A 04/28/2017   Procedure: ABDOMINAL AORTOGRAM;  Surgeon: Iran Ouch, MD;  Location: [facility];  Service: Cardiovascular;  Laterality: N/A;  . ACNE CYST REMOVAL    . CARDIAC CATHETERIZATION    . COLONOSCOPY    .  CORONARY ARTERY BYPASS GRAFT  2007  . CORONARY STENT INTERVENTION  01/05/2018  . CORONARY STENT INTERVENTION N/A 01/05/2018   Procedure: CORONARY STENT INTERVENTION;  Surgeon: Lennette Bihari, MD;  Location: [facility];  Service: Cardiovascular;  Laterality: N/A;  . CORONARY STENT PLACEMENT     x5 prior to cabg  . FEMORAL-POPLITEAL BYPASS GRAFT Right 06/28/2017   Procedure: BYPASS GRAFT COMMON FEMORAL- ABOVE KNEE POPLITEAL ARTERY WITH LEFT GREATER SAPHENOUS VEIN;  Surgeon: Fransisco Hertz, MD;  Location[facility] OR;  Service: Vascular;  Laterality: Right;  . KNEE ARTHROSCOPY      rt knee  . LEFT HEART CATH AND CORS/GRAFTS ANGIOGRAPHY N/A 01/05/2018   Procedure: LEFT HEART CATH AND CORS/GRAFTS ANGIOGRAPHY;  Surgeon: Lennette Bihari, MD;  Location: [facility];  Service: Cardiovascular;  Laterality: N/A;  . LEFT HEART CATH AND CORS/GRAFTS ANGIOGRAPHY N/A 05/27/2018   Procedure: LEFT HEART CATH AND CORS/GRAFTS ANGIOGRAPHY;  Surgeon: Lennette Bihari, MD;  Location: [facility];  Service: Cardiovascular;  Laterality: N/A;  . LOWER EXTREMITY ANGIOGRAPHY Bilateral 04/28/2017   Procedure: Lower Extremity Angiography;  Surgeon: Iran Ouch, MD;  Location: [facility];  Service: Cardiovascular;  Laterality: Bilateral;  . MULTIPLE TOOTH EXTRACTIONS    . SHOULDER ARTHROSCOPY  12/14/2011   removed bone spurs and repositioned muscle.   . TONSILLECTOMY      Social History   Socioeconomic History  . Marital status: Married    Spouse name: Not on file  . Number of children: 3  . Years of education: Not on file  . Highest education level: Not on file  Occupational History  . Occupation: RETAIL  Social Needs  . Financial resource strain: Not on file  . Food insecurity:    Worry: Not on file    Inability: Not on file  . Transportation needs:    Medical: Not on file    Non-medical: Not on file  Tobacco Use  . Smoking status: Former Smoker    Packs/day: 1.00    Types: Cigarettes    Last attempt to quit: 03/22/2017    Years since quitting: 1.3  . Smokeless tobacco: Never Used  Substance and Sexual Activity  . Alcohol use: Yes    Alcohol/week: 0.0 standard drinks    Comment: occasional - BEER OR WINE  . Drug use: No  . Sexual activity: Not on file  Lifestyle  . Physical activity:    Days per week: Not on file    Minutes per session: Not on file  . Stress: Not on file  Relationships  . Social connections:    Talks on phone: Not on file    Gets together: Not on file    Attends religious service: Not on file    Active member of club or  organization: Not on file    Attends meetings of clubs or organizations: Not on file    Relationship status: Not on file  . Intimate partner violence:    Fear of current or ex partner: Not on file    Emotionally abused: Not on file    Physically abused: Not on file    Forced sexual activity: Not on file  Other Topics Concern  . 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     Family History  Problem Relation Age of Onset  . Coronary artery disease  Father        brothers, sisters  . Heart disease Father   . Diabetes Mother   . Heart disease Sister   . Heart disease Sister   . Heart disease Brother   . Anesthesia problems Neg Hx     ROS: no fevers or chills, productive cough, hemoptysis, dysphasia, odynophagia, melena, hematochezia, dysuria, hematuria, rash, seizure activity, orthopnea, PND, pedal edema, claudication. Remaining systems are negative.  Physical Exam: Well-developed well-nourished in no acute distress.  Skin is warm and dry.  HEENT is normal.  Neck is supple.  Chest is clear to auscultation with normal expansion.  Cardiovascular exam is regular rate and rhythm.  Abdominal exam nontender or distended. No masses palpated. Extremities show no edema. neuro grossly intact   A/P  1 coronary artery disease-status post recent cardiac catheterization and medical therapy recommended.  Dual antiplatelet therapy also recommended for 1 year.  Continue aspirin and brilinta.  Discontinue brilinta September 2020.  Continue statin.  2 hyperlipidemia-patient is intolerant to statins.  Will refer to lipid clinic for consideration of Repatha.  3 hypertension-patient's blood pressure is elevated.  Increase Cozaar to 50 mg daily and follow.  4 history of abdominal aortic aneurysm-plan repeat abdominal CTA.  5 Peripheral vascular disease-continue aspirin and statin.  Followed by vascular  surgery.  Olga Millers, MD

## 2018-07-22 MED FILL — NITROGLYCERIN 0.4 MG TAB SL: 0.4 | 10 days supply | Qty: 25 | Fill #2

## 2018-07-22 MED FILL — BRILINTA 90 MG TABLET: 90 | 90 days supply | Qty: 180 | Fill #2

## 2018-07-26 ENCOUNTER — Encounter: Payer: Self-pay | Admitting: Cardiology

## 2018-07-26 ENCOUNTER — Ambulatory Visit (INDEPENDENT_AMBULATORY_CARE_PROVIDER_SITE_OTHER): Payer: 59 | Admitting: Cardiology

## 2018-07-26 VITALS — BP 148/68 | HR 68 | Ht 70.0 in | Wt 238.6 lb

## 2018-07-26 DIAGNOSIS — I10 Essential (primary) hypertension: Secondary | ICD-10-CM

## 2018-07-26 DIAGNOSIS — I714 Abdominal aortic aneurysm, without rupture, unspecified: Secondary | ICD-10-CM

## 2018-07-26 DIAGNOSIS — E785 Hyperlipidemia, unspecified: Secondary | ICD-10-CM | POA: Diagnosis not present

## 2018-07-26 DIAGNOSIS — I2510 Atherosclerotic heart disease of native coronary artery without angina pectoris: Secondary | ICD-10-CM | POA: Diagnosis not present

## 2018-07-26 LAB — BASIC METABOLIC PANEL
BUN / CREAT RATIO: 16 (ref 10–24)
BUN: 16 mg/dL (ref 8–27)
CHLORIDE: 100 mmol/L (ref 96–106)
CO2: 21 mmol/L (ref 20–29)
Calcium: 9.5 mg/dL (ref 8.6–10.2)
Creatinine, Ser: 1.03 mg/dL (ref 0.76–1.27)
GFR calc Af Amer: 87 mL/min/{1.73_m2} (ref >59)
GFR calc non Af Amer: 75 mL/min/{1.73_m2} (ref >59)
GLUCOSE: 158 mg/dL — AB (ref 65–99)
POTASSIUM: 4.3 mmol/L (ref 3.5–5.2)
Sodium: 138 mmol/L (ref 134–144)

## 2018-07-26 MED ORDER — LOSARTAN POTASSIUM 50 MG PO TABS: 50.0000 mg | ORAL_TABLET | Freq: Every evening | ORAL | 3 refills | Status: DC

## 2018-07-26 MED FILL — LOSARTAN POTASSIUM 50 MG TA: 50 | 90 days supply | Qty: 90 | Fill #0

## 2018-07-26 NOTE — Patient Instructions (Signed)
Medication Instructions:  INCREASE LOSARTAN TO 50 MG ONCE DAILY= 2 OF THE 25 MG TABLETS ONCE DAILY If you need a refill on your cardiac medications before your next appointment, please call your pharmacy.   Lab work: If you have labs (blood work) drawn today and your tests are completely normal, you will receive your results only by: Marland Kitchen MyChart Message (if you have MyChart) OR . A paper copy in the mail If you have any lab test that is abnormal or we need to change your treatment, we will call you to review the results.  Testing/Procedures: CTA OF THE ABDOMEN TO FOLLOW UP ON AAA  REFERRAL to PHARM MD FOR LIPIDS  Follow-Up: At [facility], you and your health needs are our priority.  As part of our continuing mission to provide you with exceptional heart care, we have created designated Provider Care Teams.  These Care Teams include your primary Cardiologist (physician) and Advanced Practice Providers (APPs -  Physician Assistants and Nurse Practitioners) who all work together to provide you with the care you need, when you need it. You will need a follow up appointment in 6 months.  Please call our office 2 months in advance to schedule this appointment.  You may see Olga Millers, MD or one of the following Advanced Practice Providers on your designated Care Team:   Corine Shelter, PA-C Judy Pimple, [place] . Marjie Skiff, PA-C

## 2018-08-09 MED FILL — OMEPRAZOLE 20 MG CPDR: 20 | 30 days supply | Qty: 30 | Fill #2

## 2018-08-09 MED FILL — AMLODIPINE BESYLATE 10 MG T: 10 | 90 days supply | Qty: 90 | Fill #1

## 2018-08-15 ENCOUNTER — Ambulatory Visit (INDEPENDENT_AMBULATORY_CARE_PROVIDER_SITE_OTHER)
Admission: RE | Admit: 2018-08-15 | Discharge: 2018-08-15 | Disposition: A | Discharge status: Home / Self Care | Payer: 59 | Source: Ambulatory Visit | Attending: Cardiology | Admitting: Cardiology

## 2018-08-15 DIAGNOSIS — I714 Abdominal aortic aneurysm, without rupture, unspecified: Secondary | ICD-10-CM

## 2018-08-15 MED ORDER — IOPAMIDOL (ISOVUE-300) INJECTION 61%
100.0000 mL | Freq: Once | INTRAVENOUS | Status: AC | PRN
  Administered 2018-08-15: 100 mL via INTRAVENOUS

## 2018-08-16 ENCOUNTER — Telehealth: Payer: Self-pay

## 2018-08-16 DIAGNOSIS — E785 Hyperlipidemia, unspecified: Secondary | ICD-10-CM

## 2018-08-16 NOTE — Telephone Encounter (Signed)
Called pt and left msg that the pharmacist needed a lipid panel before their appt with the lipid clinic

## 2018-08-23 ENCOUNTER — Other Ambulatory Visit: Payer: Self-pay | Admitting: *Deleted

## 2018-08-23 DIAGNOSIS — E785 Hyperlipidemia, unspecified: Secondary | ICD-10-CM | POA: Diagnosis not present

## 2018-08-23 DIAGNOSIS — Z79899 Other long term (current) drug therapy: Secondary | ICD-10-CM | POA: Diagnosis not present

## 2018-08-23 LAB — LIPID PANEL
CHOL/HDL RATIO: 6.9 ratio — AB (ref 0.0–5.0)
Cholesterol, Total: 282 mg/dL — ABNORMAL HIGH (ref 100–199)
HDL: 41 mg/dL (ref >39)
LDL Calculated: 165 mg/dL — ABNORMAL HIGH (ref 0–99)
Triglycerides: 382 mg/dL — ABNORMAL HIGH (ref 0–149)
VLDL CHOLESTEROL CAL: 76 mg/dL — AB (ref 5–40)

## 2018-08-23 LAB — HEPATIC FUNCTION PANEL
ALK PHOS: 115 IU/L (ref 39–117)
ALT: 29 IU/L (ref 0–44)
AST: 18 IU/L (ref 0–40)
Albumin: 4.1 g/dL (ref 3.6–4.8)
BILIRUBIN, DIRECT: 0.08 mg/dL (ref 0.00–0.40)
Bilirubin Total: 0.3 mg/dL (ref 0.0–1.2)
Total Protein: 7 g/dL (ref 6.0–8.5)

## 2018-08-24 ENCOUNTER — Ambulatory Visit (INDEPENDENT_AMBULATORY_CARE_PROVIDER_SITE_OTHER): Payer: 59 | Admitting: Pharmacist

## 2018-08-24 ENCOUNTER — Encounter: Payer: Self-pay | Admitting: *Deleted

## 2018-08-24 VITALS — BP 130/74 | HR 67 | Resp 15

## 2018-08-24 DIAGNOSIS — E785 Hyperlipidemia, unspecified: Secondary | ICD-10-CM

## 2018-08-24 MED ORDER — ROSUVASTATIN CALCIUM 5 MG PO TABS: ORAL_TABLET | ORAL | 1 refills | Status: DC

## 2018-08-24 MED ORDER — ROSUVASTATIN CALCIUM 5 MG PO TABS: 5.0000 mg | ORAL_TABLET | Freq: Every day | ORAL | 1 refills | Status: DC

## 2018-08-24 MED FILL — ROSUVASTATIN CALCIUM 5 MG T: 5 | 52 days supply | Qty: 15 | Fill #0

## 2018-08-24 NOTE — Telephone Encounter (Signed)
This encounter was created in error - please disregard.

## 2018-08-24 NOTE — Patient Instructions (Signed)
Lipid Clinic (pharmacist) [phone]  *START taking rosuvastatin 5mg  every Monday and Friday ONLY* *REPEAT fasting blood work in 2 months if able to tolerate rosuvastatin* *IF unable to tolerate call pharmacist to initiate paperwork for Repatha*    Cholesterol  Cholesterol is a fat. Your body needs a small amount of cholesterol. Cholesterol (plaque) may build up in your blood vessels (arteries). That makes you more likely to have a heart attack or stroke. You cannot feel your cholesterol level. Having a blood test is the only way to find out if your level is high. Keep your test results. Work with your doctor to keep your cholesterol at a good level. What do the results mean?  Total cholesterol is how much cholesterol is in your blood.  LDL is bad cholesterol. This is the type that can build up. Try to have low LDL.  HDL is good cholesterol. It cleans your blood vessels and carries LDL away. Try to have high HDL.  Triglycerides are fat that the body can store or burn for energy. What are good levels of cholesterol?  Total cholesterol below 200.  LDL below 100 is good for people who have health risks. LDL below 70 is good for people who have very high risks.  HDL above 40 is good. It is best to have HDL of 60 or higher.  Triglycerides below 150. How can I lower my cholesterol? Diet Follow your diet program as told by your doctor.  Choose fish, white meat chicken, or [place] that is roasted or baked. Try not to eat red meat, fried foods, sausage, or lunch meats.  Eat lots of fresh fruits and vegetables.  Choose whole grains, beans, pasta, potatoes, and cereals.  Choose olive oil, corn oil, or canola oil. Only use small amounts.  Try not to eat butter, mayonnaise, shortening, or palm kernel oils.  Try not to eat foods with trans fats.  Choose low-fat or nonfat dairy foods. ? Drink skim or nonfat milk. ? Eat low-fat or nonfat yogurt and cheeses. ? Try not to drink  whole milk or cream. ? Try not to eat ice cream, egg yolks, or full-fat cheeses.  Healthy desserts include angel food cake, ginger snaps, animal crackers, hard candy, popsicles, and low-fat or nonfat frozen yogurt. Try not to eat pastries, cakes, pies, and cookies.  Exercise Follow your exercise program as told by your doctor.  Be more active. Try gardening, walking, and taking the stairs.  Ask your doctor about ways that you can be more active. Medicine  Take over-the-counter and prescription medicines only as told by your doctor. This information is not intended to replace advice given to you by your health care provider. Make sure you discuss any questions you have with your health care provider. Document Released: 11/20/2008 Document Revised: 03/25/2016 Document Reviewed: 03/05/2016 Elsevier Interactive Patient Education  2019 [employer].

## 2018-08-24 NOTE — Progress Notes (Signed)
Patient ID: CRUIZ IMPERIAL                 DOB: 11-Jun-1951                    MRN: [id number]     HPI: [name] is a 67 y.o. male patient referred to lipid clinic by Dr [name]. PMH is significant for hyperlipidemia, CAD s/p PCI and CABG, hypertension, claudication, unstable angina, and pre-diabetes. Patient reports intolerance to multiple statins in the past. He develops severe legs cramps and muscle fatigue. Was able to tolerate atorvastatin for few years but not anymore. His wife is a current Repatha user but he expressed possible compliance issues with injectable medication. Patient hates pain and will not be able to self- administer Repatha. I offered 1st administration with sample in office to determine tolerability and provide additional training but patient refuses.  Current Medications: none  Intolerances:  atorvastatin - leg cramps, legs and arms  ezetimibe 10mg  daily -   pitavastatin 1mg  daily - severe leg cramps  pitavastatin 2mg  daily - severe leg crapms  rosuvastatin 20mg  daily - severe leg crapms  LDL goal: 70mg /dL  Diet: not much of a diet. Minimal restriction on meals  Exercise: activities of daily living and work as Chartered certified accountant   Family History: CAD in brother father, brother and sister,   Social History: former smoker, denies alcohol use  Labs: 08/23/18: CHO 282; TG 382;HDL 41; LDL-C 165 (NO MEDICATION)  Past Medical History:  Diagnosis Date  . Arthritis   . Asthma    occ bronchitis  . Bronchitis   . COPD (chronic obstructive pulmonary disease) (HCC)   . Coronary artery disease    a. s/p multiple prior PCIs; b. s/p CABG 06/2006 (L-LAD, S-Int and OM, S-PDA);  c. ETT-Myoview 4/13: low risk, no ischemia, EF 53%  . GERD (gastroesophageal reflux disease)   . Hx of echocardiogram    Echo 10/2006: normal LVF  . Hyperlipidemia   . Hypertension   . Myocardial infarction (HCC)   . Peripheral vascular disease (HCC)   . Seasonal allergies   . Wears dentures     . Wears glasses     Current Outpatient Medications on File Prior to Visit  Medication Sig Dispense Refill  . albuterol (PROVENTIL HFA;VENTOLIN HFA) 108 (90 Base) MCG/ACT inhaler Inhale 2 puffs into the lungs every 4 (four) hours as needed for wheezing or shortness of breath (cough, shortness of breath or wheezing.). 1 Inhaler 0  . amLODipine (NORVASC) 10 MG tablet Take 1 tablet (10 mg total) by mouth daily. 90 tablet 1  . aspirin EC 81 MG tablet Take 81 mg by mouth daily.    . cetirizine (ZYRTEC) 10 MG tablet Take 10 mg by mouth daily.     . diphenhydramine-acetaminophen (TYLENOL PM) 25-500 MG TABS tablet Take 2 tablets by mouth at bedtime.    . isosorbide mononitrate (IMDUR) 30 MG 24 hr tablet Take 1 tablet (30 mg total) by mouth daily. 90 tablet 1  . losartan (COZAAR) 50 MG tablet Take 1 tablet (50 mg total) by mouth every evening. 90 tablet 3  . metoprolol succinate (TOPROL-XL) 25 MG 24 hr tablet TAKE 1 TABLET (25 MG TOTAL) BY MOUTH DAILY. TAKE WITH OR IMMEDIATELY FOLLOWING A MEAL. 90 tablet 1  . nitroGLYCERIN (NITROSTAT) 0.4 MG SL tablet Place 1 tablet (0.4 mg total) under the tongue every 5 (five) minutes as needed for chest pain. 25 tablet 2  .  omeprazole (PRILOSEC) 20 MG capsule TAKE 1 CAPSULE BY MOUTH DAILY. 30 capsule 2  . ticagrelor (BRILINTA) 90 MG TABS tablet Take 1 tablet (90 mg total) by mouth 2 (two) times daily. 180 tablet 3   No current facility-administered medications on file prior to visit.     Allergies  Allergen Reactions  . Lipitor [Atorvastatin Calcium]     Leg cramps  . Neosporin [Neomycin-Bacitracin Zn-Polymyx] Dermatitis  . Codeine Nausea And Vomiting    Hyperlipidemia LDL goal <70 LDL extremely high for secondary prevention. Response well to statin therapy in the past bur unable to tolerate due to severe muscle pain and muscle spasms. Patient is a good candidate for PCSK9i therapy but reluctant to use injectable medication. His wife is a current Repatha user  (without problems) but he is "pretty sure" he will not comply with therapy as prescribed is needles are involved.  Noted he never tried low dose rosuvastatin. Patient willing to try rosuvastatin 5mg  2x/week for now. May add ezetimibe 10mg  to therapy if needed. Plan to repeat fasting lipid panel in 2 months if patient able to tolerate low dose rosuvsatatin.   Addalie Calles Rodriguez-Guzman PharmD, BCPS, CPP [facility] [address] [place] [address] 08/25/2018 11:20 AM

## 2018-08-24 NOTE — Telephone Encounter (Addendum)
Left message for pt to call   ----- Message from Lewayne Bunting, MD sent at 08/24/2018  7:26 AM EST ----- Refer to lipid clinic Olga Millers

## 2018-08-24 NOTE — Telephone Encounter (Signed)
Follow Up:; ° ° °Returning your call. °

## 2018-08-25 ENCOUNTER — Encounter: Payer: Self-pay | Admitting: Pharmacist

## 2018-08-25 NOTE — Assessment & Plan Note (Signed)
LDL extremely high for secondary prevention. Response well to statin therapy in the past bur unable to tolerate due to severe muscle pain and muscle spasms. Patient is a good candidate for PCSK9i therapy but reluctant to use injectable medication. His wife is a current Repatha user (without problems) but he is "pretty sure" he will not comply with therapy as prescribed is needles are involved.  Noted he never tried low dose rosuvastatin. Patient willing to try rosuvastatin 5mg  2x/week for now. May add ezetimibe 10mg  to therapy if needed. Plan to repeat fasting lipid panel in 2 months if patient able to tolerate low dose rosuvsatatin.

## 2018-09-05 ENCOUNTER — Other Ambulatory Visit: Payer: Self-pay | Admitting: *Deleted

## 2018-09-05 MED ORDER — CLOPIDOGREL BISULFATE 75 MG PO TABS: 75.0000 mg | ORAL_TABLET | Freq: Every day | ORAL | 3 refills | Status: DC

## 2018-09-05 MED FILL — CLOPIDOGREL 75 MG TABLET: 75 | 90 days supply | Qty: 90 | Fill #0

## 2018-09-05 MED FILL — ISOSORBIDE MN ER 30 MG TAB: 30 | 90 days supply | Qty: 90 | Fill #1

## 2018-09-05 MED FILL — METOPROLOL SUCCINATE ER 25: 25 | 90 days supply | Qty: 90 | Fill #1

## 2018-10-06 MED FILL — ROSUVASTATIN CALCIUM 5 MG T: 5 | 52 days supply | Qty: 15 | Fill #1

## 2018-10-17 DIAGNOSIS — M9905 Segmental and somatic dysfunction of pelvic region: Secondary | ICD-10-CM | POA: Diagnosis not present

## 2018-10-17 DIAGNOSIS — M5441 Lumbago with sciatica, right side: Secondary | ICD-10-CM | POA: Diagnosis not present

## 2018-10-17 DIAGNOSIS — M9903 Segmental and somatic dysfunction of lumbar region: Secondary | ICD-10-CM | POA: Diagnosis not present

## 2018-10-17 DIAGNOSIS — M9902 Segmental and somatic dysfunction of thoracic region: Secondary | ICD-10-CM | POA: Diagnosis not present

## 2018-10-19 DIAGNOSIS — M5441 Lumbago with sciatica, right side: Secondary | ICD-10-CM | POA: Diagnosis not present

## 2018-10-19 DIAGNOSIS — M9903 Segmental and somatic dysfunction of lumbar region: Secondary | ICD-10-CM | POA: Diagnosis not present

## 2018-10-19 DIAGNOSIS — M9902 Segmental and somatic dysfunction of thoracic region: Secondary | ICD-10-CM | POA: Diagnosis not present

## 2018-10-19 DIAGNOSIS — M9905 Segmental and somatic dysfunction of pelvic region: Secondary | ICD-10-CM | POA: Diagnosis not present

## 2018-10-21 DIAGNOSIS — M9903 Segmental and somatic dysfunction of lumbar region: Secondary | ICD-10-CM | POA: Diagnosis not present

## 2018-10-21 DIAGNOSIS — M9905 Segmental and somatic dysfunction of pelvic region: Secondary | ICD-10-CM | POA: Diagnosis not present

## 2018-10-21 DIAGNOSIS — M5441 Lumbago with sciatica, right side: Secondary | ICD-10-CM | POA: Diagnosis not present

## 2018-10-21 DIAGNOSIS — M9902 Segmental and somatic dysfunction of thoracic region: Secondary | ICD-10-CM | POA: Diagnosis not present

## 2018-10-24 ENCOUNTER — Telehealth: Payer: Self-pay | Admitting: Pharmacist

## 2018-10-24 DIAGNOSIS — E785 Hyperlipidemia, unspecified: Secondary | ICD-10-CM

## 2018-10-24 NOTE — Telephone Encounter (Signed)
Patient tolerating low dose Crestor. Order for lipids and LFTs in Epic.

## 2018-10-26 DIAGNOSIS — M9905 Segmental and somatic dysfunction of pelvic region: Secondary | ICD-10-CM | POA: Diagnosis not present

## 2018-10-26 DIAGNOSIS — M9902 Segmental and somatic dysfunction of thoracic region: Secondary | ICD-10-CM | POA: Diagnosis not present

## 2018-10-26 DIAGNOSIS — M9903 Segmental and somatic dysfunction of lumbar region: Secondary | ICD-10-CM | POA: Diagnosis not present

## 2018-10-26 DIAGNOSIS — M5441 Lumbago with sciatica, right side: Secondary | ICD-10-CM | POA: Diagnosis not present

## 2018-10-27 DIAGNOSIS — E785 Hyperlipidemia, unspecified: Secondary | ICD-10-CM | POA: Diagnosis not present

## 2018-10-27 LAB — HEPATIC FUNCTION PANEL
ALBUMIN: 4.2 g/dL (ref 3.8–4.8)
ALK PHOS: 128 IU/L — AB (ref 39–117)
ALT: 32 IU/L (ref 0–44)
AST: 17 IU/L (ref 0–40)
BILIRUBIN, DIRECT: 0.08 mg/dL (ref 0.00–0.40)
Bilirubin Total: 0.3 mg/dL (ref 0.0–1.2)
TOTAL PROTEIN: 7.3 g/dL (ref 6.0–8.5)

## 2018-10-27 LAB — LIPID PANEL
CHOL/HDL RATIO: 6.7 ratio — AB (ref 0.0–5.0)
Cholesterol, Total: 240 mg/dL — ABNORMAL HIGH (ref 100–199)
HDL: 36 mg/dL — ABNORMAL LOW (ref >39)
LDL CALC: 135 mg/dL — AB (ref 0–99)
Triglycerides: 344 mg/dL — ABNORMAL HIGH (ref 0–149)
VLDL Cholesterol Cal: 69 mg/dL — ABNORMAL HIGH (ref 5–40)

## 2018-10-28 DIAGNOSIS — M9903 Segmental and somatic dysfunction of lumbar region: Secondary | ICD-10-CM | POA: Diagnosis not present

## 2018-10-28 DIAGNOSIS — M9902 Segmental and somatic dysfunction of thoracic region: Secondary | ICD-10-CM | POA: Diagnosis not present

## 2018-10-28 DIAGNOSIS — M5441 Lumbago with sciatica, right side: Secondary | ICD-10-CM | POA: Diagnosis not present

## 2018-10-28 DIAGNOSIS — M9905 Segmental and somatic dysfunction of pelvic region: Secondary | ICD-10-CM | POA: Diagnosis not present

## 2018-10-31 ENCOUNTER — Telehealth: Payer: Self-pay | Admitting: Family Medicine

## 2018-10-31 NOTE — Telephone Encounter (Signed)
Called and spoke with pt regarding their appt scheduled on 3/31 with Dr. Shaw. Due to Dr. Shaw being out of the office, pt needed to be rescheduled. I was able to reschedule appt for 3/18 with Dr. Shaw. I advised of time, and late policy. Pt acknowledged.  °

## 2018-11-01 ENCOUNTER — Telehealth: Payer: 59 | Admitting: Nurse Practitioner

## 2018-11-01 DIAGNOSIS — R51 Headache: Secondary | ICD-10-CM | POA: Diagnosis not present

## 2018-11-01 DIAGNOSIS — R6889 Other general symptoms and signs: Secondary | ICD-10-CM | POA: Diagnosis not present

## 2018-11-01 NOTE — Progress Notes (Signed)
E visit for Flu like symptoms   We are sorry that you are not feeling well.  Here is how we plan to help! Based on what you have shared with me it looks like you may have flu-like symptoms that should be watched but do not seem to indicate anti-viral treatment.  Influenza or "the flu" is   an infection caused by a respiratory virus. The flu virus is highly contagious and persons who did not receive their yearly flu vaccination may "catch" the flu from close contact.  We have anti-viral medications to treat the viruses that cause this infection. They are not a "cure" and only shorten the course of the infection. These prescriptions are most effective when they are given within the first 2 days of "flu" symptoms. Antiviral medication are indicated if you have a high risk of complications from the flu. You should  also consider an antiviral medication if you are in close contact with someone who is at risk. These medications can help patients avoid complications from the flu  but have side effects that you should know. Possible side effects from Tamiflu or oseltamivir include nausea, vomiting, diarrhea, dizziness, headaches, eye redness, sleep problems or other respiratory symptoms. You should not take Tamiflu if you have an allergy to oseltamivir or any to the ingredients in Tamiflu.  Based upon your symptoms and potential risk factors I recommend that you follow the flu symptoms recommendation that I have listed below.  ANYONE WHO HAS FLU SYMPTOMS SHOULD: . Stay home. The flu is highly contagious and going out or to work exposes others! . Be sure to drink plenty of fluids. Water is fine as well as fruit juices, sodas and electrolyte beverages. You may want to stay away from caffeine or alcohol. If you are nauseated, try taking small sips of liquids. How do you know if you are getting enough fluid? Your urine should be a pale yellow or almost colorless. . Get rest. . Taking a steamy shower or using a  humidifier may help nasal congestion and ease sore throat pain. Using a saline nasal spray works much the same way. . Cough drops, hard candies and sore throat lozenges may ease your cough. . Line up a caregiver. Have someone check on you regularly.   GET HELP RIGHT AWAY IF: . You cannot keep down liquids or your medications. . You become short of breath . Your fell like you are going to pass out or loose consciousness. . Your symptoms persist after you have completed your treatment plan MAKE SURE YOU   Understand these instructions.  Will watch your condition.  Will get help right away if you are not doing well or get worse.  Your e-visit answers were reviewed by a board certified advanced clinical practitioner to complete your personal care plan.  Depending on the condition, your plan could have included both over the counter or prescription medications.  If there is a problem please reply  once you have received a response from your provider.  Your safety is important to us.  If you have drug allergies check your prescription carefully.    You can use MyChart to ask questions about today's visit, request a non-urgent call back, or ask for a work or school excuse for 24 hours related to this e-Visit. If it has been greater than 24 hours you will need to follow up with your provider, or enter a new e-Visit to address those concerns.  You will get an e-mail   in the next two days asking about your experience.  I hope that your e-visit has been valuable and will speed your recovery. Thank you for using e-visits.  5 minutes spent reviewing and documenting in chart.  

## 2018-11-04 ENCOUNTER — Other Ambulatory Visit: Payer: Self-pay | Admitting: Adult Health

## 2018-11-04 MED FILL — AMLODIPINE BESYLATE 10 MG T: 10 | 90 days supply | Qty: 90 | Fill #0 | Status: TO

## 2018-11-07 ENCOUNTER — Telehealth: Payer: Self-pay | Admitting: Family Medicine

## 2018-11-07 NOTE — Telephone Encounter (Signed)
mychart message sent to pt about their appointment with Dr Shaw °

## 2018-11-11 DIAGNOSIS — M5441 Lumbago with sciatica, right side: Secondary | ICD-10-CM | POA: Diagnosis not present

## 2018-11-11 DIAGNOSIS — M9905 Segmental and somatic dysfunction of pelvic region: Secondary | ICD-10-CM | POA: Diagnosis not present

## 2018-11-11 DIAGNOSIS — M9903 Segmental and somatic dysfunction of lumbar region: Secondary | ICD-10-CM | POA: Diagnosis not present

## 2018-11-11 DIAGNOSIS — M9902 Segmental and somatic dysfunction of thoracic region: Secondary | ICD-10-CM | POA: Diagnosis not present

## 2018-11-14 DIAGNOSIS — M9902 Segmental and somatic dysfunction of thoracic region: Secondary | ICD-10-CM | POA: Diagnosis not present

## 2018-11-14 DIAGNOSIS — M9903 Segmental and somatic dysfunction of lumbar region: Secondary | ICD-10-CM | POA: Diagnosis not present

## 2018-11-14 DIAGNOSIS — M9905 Segmental and somatic dysfunction of pelvic region: Secondary | ICD-10-CM | POA: Diagnosis not present

## 2018-11-14 DIAGNOSIS — M5441 Lumbago with sciatica, right side: Secondary | ICD-10-CM | POA: Diagnosis not present

## 2018-11-16 DIAGNOSIS — M9903 Segmental and somatic dysfunction of lumbar region: Secondary | ICD-10-CM | POA: Diagnosis not present

## 2018-11-16 DIAGNOSIS — M9905 Segmental and somatic dysfunction of pelvic region: Secondary | ICD-10-CM | POA: Diagnosis not present

## 2018-11-16 DIAGNOSIS — M5441 Lumbago with sciatica, right side: Secondary | ICD-10-CM | POA: Diagnosis not present

## 2018-11-16 DIAGNOSIS — M9902 Segmental and somatic dysfunction of thoracic region: Secondary | ICD-10-CM | POA: Diagnosis not present

## 2018-11-18 DIAGNOSIS — M5441 Lumbago with sciatica, right side: Secondary | ICD-10-CM | POA: Diagnosis not present

## 2018-11-18 DIAGNOSIS — M9905 Segmental and somatic dysfunction of pelvic region: Secondary | ICD-10-CM | POA: Diagnosis not present

## 2018-11-18 DIAGNOSIS — M9903 Segmental and somatic dysfunction of lumbar region: Secondary | ICD-10-CM | POA: Diagnosis not present

## 2018-11-18 DIAGNOSIS — M9902 Segmental and somatic dysfunction of thoracic region: Secondary | ICD-10-CM | POA: Diagnosis not present

## 2018-11-21 DIAGNOSIS — M9905 Segmental and somatic dysfunction of pelvic region: Secondary | ICD-10-CM | POA: Diagnosis not present

## 2018-11-21 DIAGNOSIS — M5441 Lumbago with sciatica, right side: Secondary | ICD-10-CM | POA: Diagnosis not present

## 2018-11-21 DIAGNOSIS — M9903 Segmental and somatic dysfunction of lumbar region: Secondary | ICD-10-CM | POA: Diagnosis not present

## 2018-11-21 DIAGNOSIS — M9902 Segmental and somatic dysfunction of thoracic region: Secondary | ICD-10-CM | POA: Diagnosis not present

## 2018-11-23 ENCOUNTER — Ambulatory Visit: Payer: 59 | Admitting: Family Medicine

## 2018-11-23 DIAGNOSIS — M5441 Lumbago with sciatica, right side: Secondary | ICD-10-CM | POA: Diagnosis not present

## 2018-11-23 DIAGNOSIS — M9903 Segmental and somatic dysfunction of lumbar region: Secondary | ICD-10-CM | POA: Diagnosis not present

## 2018-11-23 DIAGNOSIS — M9905 Segmental and somatic dysfunction of pelvic region: Secondary | ICD-10-CM | POA: Diagnosis not present

## 2018-11-23 DIAGNOSIS — M9902 Segmental and somatic dysfunction of thoracic region: Secondary | ICD-10-CM | POA: Diagnosis not present

## 2018-11-25 DIAGNOSIS — M9903 Segmental and somatic dysfunction of lumbar region: Secondary | ICD-10-CM | POA: Diagnosis not present

## 2018-11-25 DIAGNOSIS — M9902 Segmental and somatic dysfunction of thoracic region: Secondary | ICD-10-CM | POA: Diagnosis not present

## 2018-11-25 DIAGNOSIS — M9905 Segmental and somatic dysfunction of pelvic region: Secondary | ICD-10-CM | POA: Diagnosis not present

## 2018-11-25 DIAGNOSIS — M5441 Lumbago with sciatica, right side: Secondary | ICD-10-CM | POA: Diagnosis not present

## 2018-11-28 ENCOUNTER — Other Ambulatory Visit: Payer: Self-pay | Admitting: Cardiology

## 2018-11-28 ENCOUNTER — Other Ambulatory Visit: Payer: Self-pay | Admitting: Physician Assistant

## 2018-11-28 ENCOUNTER — Other Ambulatory Visit: Payer: Self-pay | Admitting: Cardiovascular Disease

## 2018-11-28 DIAGNOSIS — M5441 Lumbago with sciatica, right side: Secondary | ICD-10-CM | POA: Diagnosis not present

## 2018-11-28 DIAGNOSIS — M9903 Segmental and somatic dysfunction of lumbar region: Secondary | ICD-10-CM | POA: Diagnosis not present

## 2018-11-28 DIAGNOSIS — M9902 Segmental and somatic dysfunction of thoracic region: Secondary | ICD-10-CM | POA: Diagnosis not present

## 2018-11-28 DIAGNOSIS — M9905 Segmental and somatic dysfunction of pelvic region: Secondary | ICD-10-CM | POA: Diagnosis not present

## 2018-11-28 DIAGNOSIS — K219 Gastro-esophageal reflux disease without esophagitis: Secondary | ICD-10-CM

## 2018-11-28 MED FILL — METOPROLOL SUCCINATE ER 25: 25 | 90 days supply | Qty: 90 | Fill #0

## 2018-11-28 MED FILL — LOSARTAN POTASSIUM 50 MG TA: 50 | 90 days supply | Qty: 90 | Fill #0

## 2018-11-28 MED FILL — CLOPIDOGREL 75 MG TABLET: 75 | 90 days supply | Qty: 90 | Fill #0

## 2018-11-28 MED FILL — ROSUVASTATIN CALCIUM 5 MG T: 5 | 56 days supply | Qty: 16 | Fill #0

## 2018-11-28 MED FILL — ISOSORBIDE MN ER 30 MG TAB: 30 | 90 days supply | Qty: 90 | Fill #0

## 2018-11-28 MED FILL — OMEPRAZOLE 20 MG CPDR: 20 | 30 days supply | Qty: 30 | Fill #0

## 2018-12-02 DIAGNOSIS — M9905 Segmental and somatic dysfunction of pelvic region: Secondary | ICD-10-CM | POA: Diagnosis not present

## 2018-12-02 DIAGNOSIS — M9902 Segmental and somatic dysfunction of thoracic region: Secondary | ICD-10-CM | POA: Diagnosis not present

## 2018-12-02 DIAGNOSIS — M5441 Lumbago with sciatica, right side: Secondary | ICD-10-CM | POA: Diagnosis not present

## 2018-12-02 DIAGNOSIS — M9903 Segmental and somatic dysfunction of lumbar region: Secondary | ICD-10-CM | POA: Diagnosis not present

## 2018-12-06 ENCOUNTER — Ambulatory Visit: Payer: 59 | Admitting: Family Medicine

## 2018-12-12 ENCOUNTER — Other Ambulatory Visit: Payer: Self-pay

## 2018-12-12 DIAGNOSIS — I779 Disorder of arteries and arterioles, unspecified: Secondary | ICD-10-CM

## 2018-12-12 DIAGNOSIS — I70213 Atherosclerosis of native arteries of extremities with intermittent claudication, bilateral legs: Secondary | ICD-10-CM

## 2018-12-13 ENCOUNTER — Telehealth: Payer: Self-pay | Admitting: Pharmacist

## 2018-12-13 NOTE — Telephone Encounter (Signed)
Patient on rosuvastatin 2x/week with minimal improvement oin LDL.  Patient not interested on Repatha in the past.    LMOM; patient to call back if interested on lipid management alternatives like Repatha or Nexletol.

## 2018-12-20 ENCOUNTER — Encounter (HOSPITAL_COMMUNITY): Payer: 59

## 2018-12-20 ENCOUNTER — Ambulatory Visit: Payer: 59 | Admitting: Vascular Surgery

## 2019-01-25 ENCOUNTER — Encounter: Payer: Self-pay | Admitting: Internal Medicine

## 2019-01-25 MED FILL — AMLODIPINE BESYLATE 10 MG T: 10 | 90 days supply | Qty: 90 | Fill #0

## 2019-01-25 MED FILL — ROSUVASTATIN CALCIUM 5 MG T: 5 | 56 days supply | Qty: 16 | Fill #1

## 2019-03-01 MED FILL — CLOPIDOGREL 75 MG TABLET: 75 | 90 days supply | Qty: 90 | Fill #0

## 2019-03-01 MED FILL — ISOSORBIDE MN ER 30 MG TAB: 30 | 90 days supply | Qty: 90 | Fill #0

## 2019-03-01 MED FILL — METOPROLOL SUCCINATE ER 25: 25 | 90 days supply | Qty: 90 | Fill #0

## 2019-03-01 MED FILL — LOSARTAN POTASSIUM 50 MG TA: 50 | 90 days supply | Qty: 90 | Fill #0

## 2019-03-08 ENCOUNTER — Encounter: Payer: 59 | Admitting: Family Medicine

## 2019-03-24 MED FILL — ROSUVASTATIN CALCIUM 5 MG T: 5 | 56 days supply | Qty: 16 | Fill #0

## 2019-03-28 ENCOUNTER — Encounter: Payer: 59 | Admitting: Family Medicine

## 2019-04-07 ENCOUNTER — Ambulatory Visit (INDEPENDENT_AMBULATORY_CARE_PROVIDER_SITE_OTHER): Payer: 59 | Admitting: Registered Nurse

## 2019-04-07 ENCOUNTER — Encounter: Payer: Self-pay | Admitting: Registered Nurse

## 2019-04-07 ENCOUNTER — Other Ambulatory Visit: Payer: Self-pay

## 2019-04-07 VITALS — BP 135/74 | HR 61 | Temp 97.6°F | Resp 16 | Ht 69.69 in | Wt 245.0 lb

## 2019-04-07 DIAGNOSIS — Z7689 Persons encountering health services in other specified circumstances: Secondary | ICD-10-CM | POA: Diagnosis not present

## 2019-04-07 DIAGNOSIS — Z1329 Encounter for screening for other suspected endocrine disorder: Secondary | ICD-10-CM

## 2019-04-07 DIAGNOSIS — Z130 Encounter for screening for diseases of the blood and blood-forming organs and certain disorders involving the immune mechanism: Secondary | ICD-10-CM

## 2019-04-07 DIAGNOSIS — Z13220 Encounter for screening for lipoid disorders: Secondary | ICD-10-CM

## 2019-04-07 DIAGNOSIS — Z13228 Encounter for screening for other metabolic disorders: Secondary | ICD-10-CM | POA: Diagnosis not present

## 2019-04-07 NOTE — Patient Instructions (Signed)
° ° ° °  If you have lab work done today you will be contacted with your lab results within the next 2 weeks.  If you have not heard from us then please contact us. The fastest way to get your results is to register for My Chart. ° ° °IF you received an x-ray today, you will receive an invoice from [facility]. Please contact [facility] at [phone] with questions or concerns regarding your invoice.  ° °IF you received labwork today, you will receive an invoice from [facility]. Please contact [facility] at [phone] with questions or concerns regarding your invoice.  ° °Our billing staff will not be able to assist you with questions regarding bills from these companies. ° °You will be contacted with the lab results as soon as they are available. The fastest way to get your results is to activate your My Chart account. Instructions are located on the last page of this paperwork. If you have not heard from us regarding the results in 2 weeks, please contact this office. °  ° ° ° °

## 2019-04-07 NOTE — Progress Notes (Signed)
Established Patient Office Visit  Subjective:  Patient ID: [name], male    DOB: 11/21/1950  Age: 68 y.o. MRN: [id number]  CC:  Chief Complaint  Patient presents with  . Transitions Of Care    pt needs a new PCP to manage care and medications     HPI [name] presents for visit to establish care  He has a significant cardiac history, including multiple surgeries. He is followed by Dr. Jens Somrenshaw regularly.  Otherwise, he has HTN, HLD, T2DM, GERD, COPD, among other medical history. We spent time reviewing his treatment today.   He states that he does not need medication refills at this time. He is on a number of cardiac medications. He also takes Omeprazole for GERD with good effect. He states that he has not needed his albuterol inhaler in around 2 years, since he quit smoking.  Otherwise, no major complaints today. Formerly a patient of Dr. Clelia CroftShaw.   Past Medical History:  Diagnosis Date  . Arthritis   . Asthma    occ bronchitis  . Bronchitis   . COPD (chronic obstructive pulmonary disease) (HCC)   . Coronary artery disease    a. s/p multiple prior PCIs; b. s/p CABG 06/2006 (L-LAD, S-Int and OM, S-PDA);  c. ETT-Myoview 4/13: low risk, no ischemia, EF 53%  . GERD (gastroesophageal reflux disease)   . Hx of echocardiogram    Echo 10/2006: normal LVF  . Hyperlipidemia   . Hypertension   . Myocardial infarction (HCC)   . Peripheral vascular disease (HCC)   . Seasonal allergies   . Wears dentures   . Wears glasses     Past Surgical History:  Procedure Laterality Date  . ABDOMINAL AORTOGRAM N/A 04/28/2017   Procedure: ABDOMINAL AORTOGRAM;  Surgeon: Iran OuchArida, Muhammad A, MD;  Location: [facility];  Service: Cardiovascular;  Laterality: N/A;  . ACNE CYST REMOVAL    . CARDIAC CATHETERIZATION    . COLONOSCOPY    . CORONARY ARTERY BYPASS GRAFT  2007  . CORONARY STENT INTERVENTION  01/05/2018  . CORONARY STENT INTERVENTION N/A 01/05/2018   Procedure: CORONARY  STENT INTERVENTION;  Surgeon: Lennette BihariKelly, Thomas A, MD;  Location: [facility];  Service: Cardiovascular;  Laterality: N/A;  . CORONARY STENT PLACEMENT     x5 prior to cabg  . FEMORAL-POPLITEAL BYPASS GRAFT Right 06/28/2017   Procedure: BYPASS GRAFT COMMON FEMORAL- ABOVE KNEE POPLITEAL ARTERY WITH LEFT GREATER SAPHENOUS VEIN;  Surgeon: Fransisco Hertzhen, Brian L, MD;  [facility];  Service: Vascular;  Laterality: Right;  . KNEE ARTHROSCOPY     rt knee  . LEFT HEART CATH AND CORS/GRAFTS ANGIOGRAPHY N/A 01/05/2018   Procedure: LEFT HEART CATH AND CORS/GRAFTS ANGIOGRAPHY;  Surgeon: Lennette BihariKelly, Thomas A, MD;  Location: [facility];  Service: Cardiovascular;  Laterality: N/A;  . LEFT HEART CATH AND CORS/GRAFTS ANGIOGRAPHY N/A 05/27/2018   Procedure: LEFT HEART CATH AND CORS/GRAFTS ANGIOGRAPHY;  Surgeon: Lennette BihariKelly, Thomas A, MD;  Location: [facility];  Service: Cardiovascular;  Laterality: N/A;  . LOWER EXTREMITY ANGIOGRAPHY Bilateral 04/28/2017   Procedure: Lower Extremity Angiography;  Surgeon: Iran OuchArida, Muhammad A, MD;  Location: [facility];  Service: Cardiovascular;  Laterality: Bilateral;  . MULTIPLE TOOTH EXTRACTIONS    . SHOULDER ARTHROSCOPY  12/14/2011   removed bone spurs and repositioned muscle.   . TONSILLECTOMY      Family History  Problem Relation Age of Onset  . Coronary artery disease Father  brothers, sisters  . Heart disease Father   . Diabetes Mother   . Heart disease Sister   . Heart disease Sister   . Heart disease Brother   . Anesthesia problems Neg Hx     Social History   Socioeconomic History  . Marital status: Married    Spouse name: Not on file  . Number of children: 3  . Years of education: Not on file  . Highest education level: Not on file  Occupational History  . Occupation: RETAIL  Social Needs  . Financial resource strain: Not hard at all  . Food insecurity    Worry: Never true    Inability: Never true  . Transportation needs    Medical: No     Non-medical: No  Tobacco Use  . Smoking status: Former Smoker    Packs/day: 1.00    Types: Cigarettes    Quit date: 03/22/2017    Years since quitting: 2.0  . Smokeless tobacco: Never Used  Substance and Sexual Activity  . Alcohol use: Yes    Alcohol/week: 0.0 standard drinks    Comment: occasional - BEER OR WINE  . Drug use: No  . Sexual activity: Not Currently  Lifestyle  . Physical activity    Days per week: 3 days    Minutes per session: 30 min  . Stress: Patient refused  Relationships  . Social Herbalist on phone: Three times a week    Gets together: Twice a week    Attends religious service: Patient refused    Active member of club or organization: Patient refused    Attends meetings of clubs or organizations: Patient refused    Relationship status: Patient refused  . Intimate partner violence    Fear of current or ex partner: No    Emotionally abused: No    Physically abused: No    Forced sexual activity: No  Other Topics Concern  . 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     Outpatient Medications Prior to Visit  Medication Sig Dispense Refill  . albuterol (PROVENTIL HFA;VENTOLIN HFA) 108 (90 Base) MCG/ACT inhaler Inhale 2 puffs into the lungs every 4 (four) hours as needed for wheezing or shortness of breath (cough, shortness of breath or wheezing.). 1 Inhaler 0  . amLODipine (NORVASC) 10 MG tablet TAKE 1 TABLET (10 MG TOTAL) BY MOUTH DAILY. 90 tablet 2  . aspirin EC 81 MG tablet Take 81 mg by mouth daily.    . cetirizine (ZYRTEC) 10 MG tablet Take 10 mg by mouth daily.     . clopidogrel (PLAVIX) 75 MG tablet Take 1 tablet (75 mg total) by mouth daily. 90 tablet 3  . diphenhydramine-acetaminophen (TYLENOL PM) 25-500 MG TABS tablet Take 2 tablets by mouth at bedtime.    . isosorbide mononitrate (IMDUR) 30 MG 24 hr tablet TAKE 1 TABLET BY MOUTH DAILY. 90  tablet 1  . metoprolol succinate (TOPROL-XL) 25 MG 24 hr tablet TAKE 1 TABLET BY MOUTH DAILY. TAKE WITH OR IMMEDIATELY FOLLOWING A MEAL. 90 tablet 1  . nitroGLYCERIN (NITROSTAT) 0.4 MG SL tablet Place 1 tablet (0.4 mg total) under the tongue every 5 (five) minutes as needed for chest pain. 25 tablet 2  . omeprazole (PRILOSEC) 20 MG capsule TAKE 1 CAPSULE BY MOUTH DAILY. 30 capsule 2  . rosuvastatin (CRESTOR) 5 MG tablet TAKE 1 TABLET  BY MOUTH EVERY MONDAY AND FRIDAY 16 tablet 4  . losartan (COZAAR) 50 MG tablet Take 1 tablet (50 mg total) by mouth every evening. 90 tablet 3   No facility-administered medications prior to visit.     Allergies  Allergen Reactions  . Lipitor [Atorvastatin Calcium]     Leg cramps  . Neosporin [Neomycin-Bacitracin Zn-Polymyx] Dermatitis  . Codeine Nausea And Vomiting    ROS Review of Systems  Constitutional: Negative.   HENT: Negative.   Eyes: Negative.   Respiratory: Negative.   Cardiovascular: Negative.   Gastrointestinal: Negative.   Endocrine: Negative.   Genitourinary: Negative.   Musculoskeletal: Negative.   Skin: Negative.   Allergic/Immunologic: Negative.   Neurological: Negative.   Hematological: Negative.   Psychiatric/Behavioral: Negative.   All other systems reviewed and are negative.     Objective:    Physical Exam  Constitutional: He is oriented to person, place, and time. He appears well-developed and well-nourished.  HENT:  Head: Normocephalic and atraumatic.  Cardiovascular: Normal rate and regular rhythm.  Pulmonary/Chest: Effort normal. No respiratory distress.  Abdominal: Soft. He exhibits no distension.  Neurological: He is alert and oriented to person, place, and time.  Skin: Skin is warm and dry. No rash noted. No erythema. No pallor.  Psychiatric: He has a normal mood and affect. His behavior is normal. Judgment and thought content normal.  Nursing note and vitals reviewed.   BP 135/74   Pulse 61   Temp 97.6  F (36.4 C) (Oral)   Resp 16   Ht 5' 9.69" (1.77 m)   Wt 245 lb (111.1 kg)   SpO2 98%   BMI 35.47 kg/m  Wt Readings from Last 3 Encounters:  04/07/19 245 lb (111.1 kg)  07/26/18 238 lb 9.6 oz (108.2 kg)  06/14/18 230 lb (104.3 kg)     Health Maintenance Due  Topic Date Due  . OPHTHALMOLOGY EXAM  06/07/1962  . COLONOSCOPY  02/14/2017  . PNA vac Low Risk Adult (1 of 2 - PCV13) 06/07/2017  . FOOT EXAM  03/31/2018  . URINE MICROALBUMIN  03/31/2018  . HEMOGLOBIN A1C  09/03/2018    There are no preventive care reminders to display for this patient.  Lab Results  Component Value Date   TSH 1.620 02/22/2015   Lab Results  Component Value Date   WBC 8.4 05/28/2018   HGB 11.8 (L) 05/28/2018   HCT 36.3 (L) 05/28/2018   MCV 90.5 05/28/2018   PLT 177 05/28/2018   Lab Results  Component Value Date   NA 138 07/26/2018   K 4.3 07/26/2018   CO2 21 07/26/2018   GLUCOSE 158 (H) 07/26/2018   BUN 16 07/26/2018   CREATININE 1.03 07/26/2018   BILITOT 0.3 10/27/2018   ALKPHOS 128 (H) 10/27/2018   AST 17 10/27/2018   ALT 32 10/27/2018   PROT 7.3 10/27/2018   ALBUMIN 4.2 10/27/2018   CALCIUM 9.5 07/26/2018   ANIONGAP 11 05/27/2018   GFR 79.65 09/22/2010   Lab Results  Component Value Date   CHOL 240 (H) 10/27/2018   Lab Results  Component Value Date   HDL 36 (L) 10/27/2018   Lab Results  Component Value Date   LDLCALC 135 (H) 10/27/2018   Lab Results  Component Value Date   TRIG 344 (H) 10/27/2018   Lab Results  Component Value Date   CHOLHDL 6.7 (H) 10/27/2018   Lab Results  Component Value Date   HGBA1C 7.0 (H) 03/04/2018  Assessment & Plan:   Problem List Items Addressed This Visit    None    Visit Diagnoses    Encounter to establish care    -  Primary   Screening for endocrine, metabolic and immunity disorder       Relevant Orders   Hemoglobin A1c   CBC with Differential   Comprehensive metabolic panel   Lipid screening       Relevant  Orders   Lipid panel      No orders of the defined types were placed in this encounter.   Follow-up: depending on lab results  PLAN  Mr Routh and I spoke at some length about his medical history and a plan for follow up from today's visit. It appears, in the past, that his A1c has been diagnostic of diabetes, however, he is not currently being treated. He has not had a diabetic eye exam - will refer today. Otherwise, wants to delay colonoscopy, podiatry, and PNA vaccine.  We will draw labs today and make a follow up plan based on those results - if his A1c continues above 7, we will start medication and have him follow up in around 3 months for labs and med check. Otherwise, we can consider 6 month follow up.  Otherwise, he is feeling good at this time and has no urgent complaints. He is very pleasant.  Patient encouraged to call clinic with any questions, comments, or concerns.     Janeece Agee, NP

## 2019-04-08 LAB — COMPREHENSIVE METABOLIC PANEL
ALT: 38 IU/L (ref 0–44)
AST: 22 IU/L (ref 0–40)
Albumin/Globulin Ratio: 1.7 (ref 1.2–2.2)
Albumin: 4.2 g/dL (ref 3.8–4.8)
Alkaline Phosphatase: 131 IU/L — ABNORMAL HIGH (ref 39–117)
BUN/Creatinine Ratio: 20 (ref 10–24)
BUN: 22 mg/dL (ref 8–27)
Bilirubin Total: 0.2 mg/dL (ref 0.0–1.2)
CO2: 21 mmol/L (ref 20–29)
Calcium: 9.2 mg/dL (ref 8.6–10.2)
Chloride: 100 mmol/L (ref 96–106)
Creatinine, Ser: 1.08 mg/dL (ref 0.76–1.27)
GFR calc Af Amer: 82 mL/min/{1.73_m2} (ref >59)
GFR calc non Af Amer: 71 mL/min/{1.73_m2} (ref >59)
Globulin, Total: 2.5 g/dL (ref 1.5–4.5)
Glucose: 151 mg/dL — ABNORMAL HIGH (ref 65–99)
Potassium: 3.9 mmol/L (ref 3.5–5.2)
Sodium: 139 mmol/L (ref 134–144)
Total Protein: 6.7 g/dL (ref 6.0–8.5)

## 2019-04-08 LAB — CBC WITH DIFFERENTIAL/PLATELET
Basophils Absolute: 0.1 10*3/uL (ref 0.0–0.2)
Basos: 1 %
EOS (ABSOLUTE): 0.4 10*3/uL (ref 0.0–0.4)
Eos: 5 %
Hematocrit: 40.2 % (ref 37.5–51.0)
Hemoglobin: 13.3 g/dL (ref 13.0–17.7)
Immature Grans (Abs): 0 10*3/uL (ref 0.0–0.1)
Immature Granulocytes: 0 %
Lymphocytes Absolute: 2.1 10*3/uL (ref 0.7–3.1)
Lymphs: 28 %
MCH: 29.4 pg (ref 26.6–33.0)
MCHC: 33.1 g/dL (ref 31.5–35.7)
MCV: 89 fL (ref 79–97)
Monocytes Absolute: 0.8 10*3/uL (ref 0.1–0.9)
Monocytes: 11 %
Neutrophils Absolute: 4.1 10*3/uL (ref 1.4–7.0)
Neutrophils: 55 %
Platelets: 216 10*3/uL (ref 150–450)
RBC: 4.52 x10E6/uL (ref 4.14–5.80)
RDW: 14.1 % (ref 11.6–15.4)
WBC: 7.5 10*3/uL (ref 3.4–10.8)

## 2019-04-08 LAB — HEMOGLOBIN A1C
Est. average glucose Bld gHb Est-mCnc: 194 mg/dL
Hgb A1c MFr Bld: 8.4 % — ABNORMAL HIGH (ref 4.8–5.6)

## 2019-04-08 LAB — LIPID PANEL
Chol/HDL Ratio: 6.2 ratio — ABNORMAL HIGH (ref 0.0–5.0)
Cholesterol, Total: 210 mg/dL — ABNORMAL HIGH (ref 100–199)
HDL: 34 mg/dL — ABNORMAL LOW (ref >39)
Triglycerides: 421 mg/dL — ABNORMAL HIGH (ref 0–149)

## 2019-04-10 ENCOUNTER — Other Ambulatory Visit: Payer: Self-pay | Admitting: Registered Nurse

## 2019-04-10 DIAGNOSIS — E119 Type 2 diabetes mellitus without complications: Secondary | ICD-10-CM

## 2019-04-10 MED ORDER — METFORMIN HCL 500 MG PO TABS: 500.0000 mg | ORAL_TABLET | Freq: Two times a day (BID) | ORAL | 0 refills | Status: DC

## 2019-04-10 MED FILL — metFORMIN HCL 500 MG TABS: 500 | 90 days supply | Qty: 180 | Fill #0

## 2019-04-10 NOTE — Progress Notes (Signed)
Good morning Mr. Ngo  Your labs have returned. Your cholesterol continues to be high - I would recommend strong changes to your diet to address this, as you have been unable to tolerate cholesterol medications in the past. If you had had anything to eat prior to labs being drawn on Friday, this may have affected your triglycerides, which then unfortunately make the LDL and VLDL unreadable. Your A1c has unfortunately risen to 8.4%. This is outside the range of where I'd like to see you - I'm hoping we can get you back down below 7.5% in the coming months. I'd like to start you on a medication called Metformin. You will take 500mg  by mouth twice daily - once in the morning, once in the evening. This medication works by limiting how much blood sugar your body creates, helping keep your sugars down. The most common side effect is upset stomach, so take this medication with food. As far as diet goes: I recommend three simple rules, eat real (not processed) foods, limit portion sizes, eat mostly vegetable.  I'm hoping to see you in around 3 months for a follow up visit. We can check on your blood pressure and A1c at that time, as well as your lipids if you're able to make a morning appointment and come in fasting for at least 8 hours.  If you have any questions, feel free to give [place] a call. Have a great week,  Kathrin Ruddy, NP

## 2019-05-08 ENCOUNTER — Other Ambulatory Visit: Payer: Self-pay

## 2019-05-08 ENCOUNTER — Ambulatory Visit: Payer: Self-pay

## 2019-05-08 ENCOUNTER — Emergency Department (INDEPENDENT_AMBULATORY_CARE_PROVIDER_SITE_OTHER)
Admission: EM | Admit: 2019-05-08 | Discharge: 2019-05-08 | Disposition: A | Discharge status: Home / Self Care | Payer: 59 | Source: Home / Self Care

## 2019-05-08 ENCOUNTER — Telehealth: Payer: Self-pay

## 2019-05-08 DIAGNOSIS — R42 Dizziness and giddiness: Secondary | ICD-10-CM | POA: Diagnosis not present

## 2019-05-08 DIAGNOSIS — H8110 Benign paroxysmal vertigo, unspecified ear: Secondary | ICD-10-CM

## 2019-05-08 LAB — POCT FASTING CBG KUC MANUAL ENTRY: POCT Glucose (KUC): 153 mg/dL — AB (ref 70–99)

## 2019-05-08 MED ORDER — MECLIZINE HCL 25 MG PO TABS: 25.0000 mg | ORAL_TABLET | Freq: Two times a day (BID) | ORAL | 0 refills | Status: DC | PRN

## 2019-05-08 NOTE — ED Triage Notes (Addendum)
Pt c/o dizziness since he woke up this morning. Says the room was spinning when he woke. Has felt light headed most the day after. Has not had anything but water today. 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

## 2019-05-08 NOTE — Telephone Encounter (Signed)
Attempted to call patient, no answer 

## 2019-05-08 NOTE — Discharge Instructions (Signed)
°  Be sure to stay well hydrated and get plenty of sleep.  Antivert (meclizine) is a medication to help with dizziness and nausea related to vertigo.  This medication can cause drowsiness. Do not operate heavy machinery or drive while taking.   Please call or send your family medicine provider a MyChart message about today's visit.  They may want to change your medication or perform more tests before making any medication changes.  Call 911 or have someone drive you to the hospital if symptoms worsening or new concerning symptoms develop- headache, change in vision, slurred speech, confusion, one sided weakness or numbness, etc.

## 2019-05-08 NOTE — ED Provider Notes (Signed)
[name] CARE    CSN: [id number] Arrival date & time: 05/08/19  1429      History   Chief Complaint Chief Complaint  Patient presents with  . Dizziness    HPI [name] is a 68 y.o. male.   HPI  [name] is a 68 y.o. male presenting to UC with c/o dizziness that started after rolling over in bed this afternoon.  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 The dizziness lasted about 20 minutes, felt like the room was spinning, especially when moving his head. Mild nausea but no vomiting. Now, pt has a mild headache but no dizziness, nausea, or change in vision. Denies weakness or numbness in arms or legs. No confusion.  Denies chest pain or palpitations. Pt states he has been helping a friend work at night but has to work up high, and he knows it is not the safest position to be in when dizzy. He wanted to be evaluated before his shift tonight. Hx of similar sensation after taking Brilinta for about 1 month.  He was switched to Plavix and felt better within a few days.  He most recently started taking Metformin about 1 month ago and wonders if that could be contributing to his dizziness. Denies vomiting or abdominal pain. He does not check his blood sugar at home.  He ate about 12 hours ago since he was sleeping after his night shift.    BP elevated, hx of HTN. He did take his BP medication today.  Past Medical History:  Diagnosis Date  . Arthritis   . Asthma    occ bronchitis  . Bronchitis   . COPD (chronic obstructive pulmonary disease) (HCC)   . Coronary artery disease    a. s/p multiple prior PCIs; b. s/p CABG 06/2006 (L-LAD, S-Int and OM, S-PDA);  c. ETT-Myoview 4/13: low risk, no ischemia, EF 53%  . GERD (gastroesophageal reflux disease)   . Hx of echocardiogram    Echo 10/2006: normal LVF  . Hyperlipidemia   . Hypertension   . Myocardial infarction (HCC)   . Peripheral vascular disease (HCC)   . Seasonal allergies   . Wears dentures   .  Wears glasses     Patient Active Problem List   Diagnosis Date Noted  . Hx of CABG 06/09/2018  . Antiplatelet or antithrombotic long-term use 04/25/2018  . Abnormal stress test 01/06/2018  . Status post bypass graft of extremity 10/20/2017  . Atherosclerosis of native artery of both lower extremities with intermittent claudication (HCC) 06/18/2017  . Peripheral arterial occlusive disease (HCC) 04/09/2017  . Bruit 10/23/2015  . Prediabetes 12/19/2014  . GERD (gastroesophageal reflux disease) 12/19/2014  . CLAUDICATION 09/04/2010  . Hyperlipidemia LDL goal <70 08/12/2009  . Essential hypertension 08/12/2009  . Acute myocardial infarction (HCC) 08/12/2009  . CAD S/P percutaneous coronary angioplasty 08/12/2009  . ISCHEMIC CARDIOMYOPATHY 08/12/2009  . 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 SYNCOPE 08/12/2009  . Insomnia 08/12/2009    Past Surgical History:  Procedure Laterality Date  . ABDOMINAL AORTOGRAM N/A 04/28/2017   Procedure: ABDOMINAL AORTOGRAM;  Surgeon: Iran Ouch, MD;  Location: [facility];  Service: Cardiovascular;  Laterality: N/A;  . ACNE CYST REMOVAL    . CARDIAC CATHETERIZATION    . COLONOSCOPY    . CORONARY ARTERY BYPASS GRAFT  2007  . CORONARY STENT INTERVENTION  01/05/2018  . CORONARY STENT INTERVENTION N/A 01/05/2018   Procedure: CORONARY STENT INTERVENTION;  Surgeon: Lennette BihariKelly, Thomas A, MD;  [facility];  Service: Cardiovascular;  Laterality: N/A;  . CORONARY STENT PLACEMENT     x5 prior to cabg  . FEMORAL-POPLITEAL BYPASS GRAFT Right 06/28/2017   Procedure: BYPASS GRAFT COMMON FEMORAL- ABOVE KNEE POPLITEAL ARTERY WITH LEFT GREATER SAPHENOUS VEIN;  Surgeon: Fransisco Hertzhen, Brian L, MD;  [facility];  Service: Vascular;  Laterality: Right;  . KNEE ARTHROSCOPY     rt knee  . LEFT HEART CATH AND CORS/GRAFTS ANGIOGRAPHY N/A 01/05/2018   Procedure: LEFT HEART CATH AND CORS/GRAFTS ANGIOGRAPHY;  Surgeon: Lennette BihariKelly, Thomas A, MD;  Location: [facility]  [facility];  Service: Cardiovascular;  Laterality: N/A;  . LEFT HEART CATH AND CORS/GRAFTS ANGIOGRAPHY N/A 05/27/2018   Procedure: LEFT HEART CATH AND CORS/GRAFTS ANGIOGRAPHY;  Surgeon: Lennette BihariKelly, Thomas A, MD;  Location: [facility];  Service: Cardiovascular;  Laterality: N/A;  . LOWER EXTREMITY ANGIOGRAPHY Bilateral 04/28/2017   Procedure: Lower Extremity Angiography;  Surgeon: Iran OuchArida, Muhammad A, MD;  Location: [facility];  Service: Cardiovascular;  Laterality: Bilateral;  . MULTIPLE TOOTH EXTRACTIONS    . SHOULDER ARTHROSCOPY  12/14/2011   removed bone spurs and repositioned muscle.   . TONSILLECTOMY         Home Medications    Prior to Admission medications   Medication Sig Start Date End Date Taking? Authorizing Provider  albuterol (PROVENTIL HFA;VENTOLIN HFA) 108 (90 Base) MCG/ACT inhaler Inhale 2 puffs into the lungs every 4 (four) hours as needed for wheezing or shortness of breath (cough, shortness of breath or wheezing.). 11/02/16   Sherren MochaShaw, Eva N, MD  amLODipine (NORVASC) 10 MG tablet TAKE 1 TABLET (10 MG TOTAL) BY MOUTH DAILY. 11/04/18   Jodelle GrossLawrence, Kathryn M, NP  aspirin EC 81 MG tablet Take 81 mg by mouth daily.    [provider]  cetirizine (ZYRTEC) 10 MG tablet Take 10 mg by mouth daily.     [provider]  clopidogrel (PLAVIX) 75 MG tablet Take 1 tablet (75 mg total) by mouth daily. 09/05/18   Lewayne Buntingrenshaw, Brian S, MD  diphenhydramine-acetaminophen (TYLENOL PM) 25-500 MG TABS tablet Take 2 tablets by mouth at bedtime.    [provider]  isosorbide mononitrate (IMDUR) 30 MG 24 hr tablet TAKE 1 TABLET BY MOUTH DAILY. 11/28/18   Abelino DerrickKilroy, Luke K, PA-C  losartan (COZAAR) 50 MG tablet Take 1 tablet (50 mg total) by mouth every evening. 07/26/18 10/24/18  Lewayne Buntingrenshaw, Brian S, MD  meclizine (ANTIVERT) 25 MG tablet Take 1 tablet (25 mg total) by mouth 2 (two) times daily as needed for dizziness. 05/08/19   Lurene ShadowPhelps, Pierce Biagini O, PA-C  metFORMIN (GLUCOPHAGE) 500 MG tablet  Take 1 tablet (500 mg total) by mouth 2 (two) times daily with a meal. 04/10/19   Janeece AgeeMorrow, Richard, NP  metoprolol succinate (TOPROL-XL) 25 MG 24 hr tablet TAKE 1 TABLET BY MOUTH DAILY. TAKE WITH OR IMMEDIATELY FOLLOWING A MEAL. 11/28/18   Lennette BihariKelly, Thomas A, MD  nitroGLYCERIN (NITROSTAT) 0.4 MG SL tablet Place 1 tablet (0.4 mg total) under the tongue every 5 (five) minutes as needed for chest pain. 12/15/17   Jodelle GrossLawrence, Kathryn M, NP  omeprazole (PRILOSEC) 20 MG capsule TAKE 1 CAPSULE BY MOUTH DAILY. 11/28/18   Sherren MochaShaw, Eva N, MD  rosuvastatin (CRESTOR) 5 MG tablet TAKE 1 TABLET BY MOUTH EVERY MONDAY AND FRIDAY 11/28/18   Lewayne Buntingrenshaw, Brian S, MD    Family History Family History  Problem Relation Age of Onset  .  Coronary artery disease Father        brothers, sisters  . Heart disease Father   . Diabetes Mother   . Heart disease Sister   . Heart disease Sister   . Heart disease Brother   . Anesthesia problems Neg Hx     Social History Social History   Tobacco Use  . Smoking status: Former Smoker    Packs/day: 1.00    Types: Cigarettes    Quit date: 03/22/2017    Years since quitting: 2.1  . Smokeless tobacco: Never Used  Substance Use Topics  . Alcohol use: Yes    Alcohol/week: 0.0 standard drinks    Comment: occasional - BEER OR WINE  . Drug use: No     Allergies   Lipitor [atorvastatin calcium], Neosporin [neomycin-bacitracin zn-polymyx], and Codeine   Review of Systems Review of Systems  Constitutional: Negative for chills, diaphoresis, fatigue and fever.  Respiratory: Negative for chest tightness and shortness of breath.   Cardiovascular: Negative for chest pain, palpitations and leg swelling.  Gastrointestinal: Positive for nausea. Negative for diarrhea and vomiting.  Neurological: Positive for dizziness and headaches (mild). Negative for syncope, weakness, light-headedness and numbness.     Physical Exam Triage Vital Signs ED Triage Vitals  Enc Vitals Group     BP 05/08/19  1445 (!) 160/70     Pulse Rate 05/08/19 1445 72     Resp 05/08/19 1445 18     Temp 05/08/19 1445 (!) 97.5 F (36.4 C)     Temp Source 05/08/19 1445 Oral     SpO2 05/08/19 1445 98 %     Weight 05/08/19 1446 240 lb (108.9 kg)     Height 05/08/19 1446 5\' 10"  (1.778 m)     Head Circumference --      Peak Flow --      Pain Score 05/08/19 1446 0     Pain Loc --      Pain Edu? --      Excl. in GC? --    Orthostatic VS for the past 24 hrs:  BP- Lying Pulse- Lying BP- Sitting Pulse- Sitting BP- Standing at 0 minutes Pulse- Standing at 0 minutes  05/08/19 1505 147/78 69 145/79 67 132/77 69    Updated Vital Signs BP (!) 160/70 (BP Location: Right Arm)   Pulse 72   Temp (!) 97.5 F (36.4 C) (Oral)   Resp 18   Ht 5\' 10"  (1.778 m)   Wt 240 lb (108.9 kg)   SpO2 98%   BMI 34.44 kg/m   Visual Acuity Right Eye Distance:   Left Eye Distance:   Bilateral Distance:    Right Eye Near:   Left Eye Near:    Bilateral Near:     Physical Exam Vitals signs and nursing note reviewed.  Constitutional:      General: He is not in acute distress.    Appearance: Normal appearance. He is well-developed. He is not ill-appearing, toxic-appearing or diaphoretic.  HENT:     Head: Normocephalic and atraumatic.     Right Ear: Tympanic membrane normal.     Left Ear: Tympanic membrane normal.     Nose: Nose normal.     Mouth/Throat:     Lips: Pink.     Mouth: Mucous membranes are moist.     Pharynx: Oropharynx is clear. Uvula midline.  Eyes:     Extraocular Movements: Extraocular movements intact.     Conjunctiva/sclera: Conjunctivae normal.     Pupils:  Pupils are equal, round, and reactive to light.  Neck:     Musculoskeletal: Normal range of motion.  Cardiovascular:     Rate and Rhythm: Normal rate and regular rhythm.  Pulmonary:     Effort: Pulmonary effort is normal.     Breath sounds: Normal breath sounds.  Abdominal:     General: There is no distension.     Palpations: Abdomen is  soft.     Tenderness: There is no abdominal tenderness.  Musculoskeletal: Normal range of motion.  Skin:    General: Skin is warm and dry.     Capillary Refill: Capillary refill takes less than 2 seconds.  Neurological:     General: No focal deficit present.     Mental Status: He is alert and oriented to person, place, and time.     Cranial Nerves: No cranial nerve deficit.     Sensory: No sensory deficit.     Motor: No weakness.     Coordination: Coordination normal.     Gait: Gait normal.     Deep Tendon Reflexes: Reflexes normal.  Psychiatric:        Mood and Affect: Mood normal.        Behavior: Behavior normal.      UC Treatments / Results  Labs (all labs ordered are listed, but only abnormal results are displayed) Labs Reviewed  POCT FASTING CBG [facility] MANUAL ENTRY - Abnormal; Notable for the following components:      Result Value   POCT Glucose ([facility]) 153 (*)    All other components within normal limits    EKG   Radiology No results found.  Procedures Procedures (including critical care time)  Medications Ordered in UC Medications - No data to display  Initial Impression / Assessment and Plan / UC Course  I have reviewed the triage vital signs and the nursing notes.  Pertinent labs & imaging results that were available during my care of the patient were reviewed by me and considered in my medical decision making (see chart for details).     Dizziness with nausea this about 1 hour PTA Normal neuro exam Dizziness has resolved. Normal orthostatic vitals EKG- no change since prior, confirmed with Dr. [name], pt's cardiologist.  No evidence of CVA at this time. Question if pt is having another medication reaction, however, due to elevated blood sugar, recommend close f/u with PCP to discuss possible metformin change before stopping immediately.  Will start pt on meclizine to help shoulder dizziness return. Discussed symptoms that warrant emergent care in  the ED. AVS provided. Final Clinical Impressions(s) / UC Diagnoses   Final diagnoses:  Dizziness  Benign paroxysmal positional vertigo, unspecified laterality     Discharge Instructions      Be sure to stay well hydrated and get plenty of sleep.  Antivert (meclizine) is a medication to help with dizziness and nausea related to vertigo.  This medication can cause drowsiness. Do not operate heavy machinery or drive while taking.   Please call or send your family medicine provider a MyChart message about today's visit.  They may want to change your medication or perform more tests before making any medication changes.  Call 911 or have someone drive you to the hospital if symptoms worsening or new concerning symptoms develop- headache, change in vision, slurred speech, confusion, one sided weakness or numbness, etc.    ED Prescriptions    Medication Sig Dispense Auth. Provider   meclizine (ANTIVERT) 25 MG tablet Take  1 tablet (25 mg total) by mouth 2 (two) times daily as needed for dizziness. 15 tablet Noe Gens, PA-C     Controlled Substance Prescriptions [place] Controlled Substance Registry consulted? Not Applicable   [name] 05/08/19 Bosie Helper

## 2019-05-08 NOTE — Telephone Encounter (Signed)
Pt called stating that he feels dizzy. He states that it started today when he woke. He says the room spins when he moves and in general he is lightheaded. He states that he felt this was only one time before and he was taken of his brillinta and it solved his problem. He takes Plavix for some stents that were placed a few years ago. He is not able to take his BP.  He denies other symptoms No bleeding He states he drinks plenty of water. He is not experiencing weakness or numbness. No headache. 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 Care advice read to patient. He verbalized understanding. Call transferred to office for scheduling.  Reason for Disposition . Taking a medicine that could cause dizziness (e.g., blood pressure medications, diuretics)  Answer Assessment - Initial Assessment Questions 1. DESCRIPTION: "Describe your dizziness."     lightheaded 2. LIGHTHEADED: "Do you feel lightheaded?" (e.g., somewhat faint, woozy, weak upon standing)    spinning 3. VERTIGO: "Do you feel like either you or the room is spinning or tilting?" (i.e. vertigo)     spinning 4. SEVERITY: "How bad is it?"  "Do you feel like you are going to faint?" "Can you stand and walk?"   - MILD - walking normally   - MODERATE - interferes with normal activities (e.g., work, school)    - SEVERE - unable to stand, requires support to walk, feels like passing out now.      moderate 5. ONSET:  "When did the dizziness begin?"     1 hour ago 6. AGGRAVATING FACTORS: "Does anything make it worse?" (e.g., standing, change in head position)     Just movement 7. HEART RATE: "Can you tell me your heart rate?" "How many beats in 15 seconds?"  (Note: not all patients can do this)      Stent placed 8. CAUSE: "What do you think is causing the dizziness?"     unsure 9. RECURRENT SYMPTOM: "Have you had dizziness before?" If so, ask: "When was the last time?" "What happened that time?"     When swithced from berlinta to plavix 10. OTHER  SYMPTOMS: "Do you have any other symptoms?" (e.g., fever, chest pain, vomiting, diarrhea, bleeding)       no 11. PREGNANCY: "Is there any chance you are pregnant?" "When was your last menstrual period?"       N/A  Protocols used: DIZZINESS [facility]

## 2019-05-09 NOTE — Telephone Encounter (Signed)
Attempted to call, left message on VM with contact information for any questions or concerns.

## 2019-05-11 ENCOUNTER — Other Ambulatory Visit: Payer: Self-pay

## 2019-05-11 ENCOUNTER — Ambulatory Visit (INDEPENDENT_AMBULATORY_CARE_PROVIDER_SITE_OTHER): Payer: 59 | Admitting: Family Medicine

## 2019-05-11 ENCOUNTER — Encounter: Payer: Self-pay | Admitting: Family Medicine

## 2019-05-11 VITALS — BP 140/72 | HR 54 | Temp 98.3°F | Resp 17 | Ht 70.0 in | Wt 242.0 lb

## 2019-05-11 DIAGNOSIS — Z95828 Presence of other vascular implants and grafts: Secondary | ICD-10-CM

## 2019-05-11 DIAGNOSIS — I779 Disorder of arteries and arterioles, unspecified: Secondary | ICD-10-CM

## 2019-05-11 DIAGNOSIS — E119 Type 2 diabetes mellitus without complications: Secondary | ICD-10-CM

## 2019-05-11 DIAGNOSIS — H8110 Benign paroxysmal vertigo, unspecified ear: Secondary | ICD-10-CM

## 2019-05-11 MED FILL — AMLODIPINE BESYLATE 10 MG T: 10 | 90 days supply | Qty: 90 | Fill #0

## 2019-05-11 MED FILL — OMEPRAZOLE 20 MG CAPSULE DR: 20 | 30 days supply | Qty: 30 | Fill #0

## 2019-05-11 NOTE — Patient Instructions (Addendum)
The most likely cause of this condition is benign positional vertigo.  As discussed, caution must be taken that you do not have problems with dizziness at a time that it might cause you risk, such as when you are working at heights.  Also if you are driving and get an all dizzy pull off the road and if necessary call for help.  If the headache gets more intense please get rechecked.  In the meanwhile it is fine to take Tylenol 500 mg 2 pills 3 times daily.  Since you take the Tylenol PM, total up the amount of acetaminophen you get and do not exceed 3000 mg in 24 hours.  If symptoms continue to persist into next week, then I think it would be appropriate to refer you to a neurologist and/or go ahead and order an MRI of your head.  In the event of acute worsening symptoms go to an emergency room.   Benign Positional Vertigo Vertigo is the feeling that you or your surroundings are moving when they are not. Benign positional vertigo is the most common form of vertigo. This is usually a harmless condition (benign). This condition is positional. This means that symptoms are triggered by certain movements and positions. This condition can be dangerous if it occurs while you are doing something that could cause harm to you or others. This includes activities such as driving or operating machinery. What are the causes? In many cases, the cause of this condition is not known. It may be caused by a disturbance in an area of the inner ear that helps your brain to sense movement and balance. This disturbance can be caused by:  Viral infection (labyrinthitis).  Head injury.  Repetitive motion, such as jumping, dancing, or running. What increases the risk? You are more likely to develop this condition if:  You are a woman.  You are 67 years of age or older. What are the signs or symptoms? Symptoms of this condition usually happen when you move your head or your eyes in different directions. Symptoms  may start suddenly, and usually last for less than a minute. They include:  Loss of balance and falling.  Feeling like you are spinning or moving.  Feeling like your surroundings are spinning or moving.  Nausea and vomiting.  Blurred vision.  Dizziness.  Involuntary eye movement (nystagmus). Symptoms can be mild and cause only minor problems, or they can be severe and interfere with daily life. Episodes of benign positional vertigo may return (recur) over time. Symptoms may improve over time. How is this diagnosed? This condition may be diagnosed based on:  Your medical history.  Physical exam of the head, neck, and ears.  Tests, such as: ? MRI. ? CT scan. ? Eye movement tests. Your health care provider may ask you to change positions quickly while he or she watches you for symptoms of benign positional vertigo, such as nystagmus. Eye movement may be tested with a variety of exams that are designed to evaluate or stimulate vertigo. ? An electroencephalogram (EEG). This records electrical activity in your brain. ? Hearing tests. You may be referred to a health care provider who specializes in ear, nose, and throat (ENT) problems (otolaryngologist) or a provider who specializes in disorders of the nervous system (neurologist). How is this treated?  This condition may be treated in a session in which your health care provider moves your head in specific positions to adjust your inner ear back to normal. Treatment for this  condition may take several sessions. Surgery may be needed in severe cases, but this is rare. In some cases, benign positional vertigo may resolve on its own in 2-4 weeks. Follow these instructions at home: Safety  Move slowly. Avoid sudden body or head movements or certain positions, as told by your health care provider.  Avoid driving until your health care provider says it is safe for you to do so.  Avoid operating heavy machinery until your health care  provider says it is safe for you to do so.  Avoid doing any tasks that would be dangerous to you or others if vertigo occurs.  If you have trouble walking or keeping your balance, try using a cane for stability. If you feel dizzy or unstable, sit down right away.  Return to your normal activities as told by your health care provider. Ask your health care provider what activities are safe for you. General instructions  Take over-the-counter and prescription medicines only as told by your health care provider.  Drink enough fluid to keep your urine pale yellow.  Keep all follow-up visits as told by your health care provider. This is important. Contact a health care provider if:  You have a fever.  Your condition gets worse or you develop new symptoms.  Your family or friends notice any behavioral changes.  You have nausea or vomiting that gets worse.  You have numbness or a "pins and needles" sensation. Get help right away if you:  Have difficulty speaking or moving.  Are always dizzy.  Faint.  Develop severe headaches.  Have weakness in your legs or arms.  Have changes in your hearing or vision.  Develop a stiff neck.  Develop sensitivity to light. Summary  Vertigo is the feeling that you or your surroundings are moving when they are not. Benign positional vertigo is the most common form of vertigo.  The cause of this condition is not known. It may be caused by a disturbance in an area of the inner ear that helps your brain to sense movement and balance.  Symptoms include loss of balance and falling, feeling that you or your surroundings are moving, nausea and vomiting, and blurred vision.  This condition can be diagnosed based on symptoms, physical exam, and other tests, such as MRI, CT scan, eye movement tests, and hearing tests.  Follow safety instructions as told by your health care provider. You will also be told when to contact your health care provider in  case of problems. This information is not intended to replace advice given to you by your health care provider. Make sure you discuss any questions you have with your health care provider. Document Released: 06/01/2006 Document Revised: 02/02/2018 Document Reviewed: 02/02/2018 Elsevier Patient Education  [employer].   If you have lab work done today you will be contacted with your lab results within the next 2 weeks.  If you have not heard from [place] then please contact us. The fastest way to get your results is to register for My Chart.   IF you received an x-ray today, you will receive an invoice from [facility]. Please contact [facility] at [phone] with questions or concerns regarding your invoice.   IF you received labwork today, you will receive an invoice from [place]. Please contact [facility] at [phone] with questions or concerns regarding your invoice.   Our billing staff will not be able to assist you with questions regarding bills from these companies.  You will be  contacted with the lab results as soon as they are available. The fastest way to get your results is to activate your My Chart account. Instructions are located on the last page of this paperwork. If you have not heard from [place] regarding the results in 2 weeks, please contact this office.

## 2019-05-11 NOTE — Progress Notes (Signed)
Patient ID: [name], male    DOB: 03/05/1951  Age: 68 y.o. MRN: [id number]  Chief Complaint  Patient presents with  . Dizziness    onset: monday or tuesday morning, seen UC in [place] and given meclizine.  Per pt no dizziness today    Subjective:   68 year old man who comes in with history of having had a little occipital headache for several days.  It is not been real severe though he has taken a little Tylenol for it.  Monday morning when he woke up he developed acute severe room spinning dizziness.  He sat up for a while and it subsided.  That is occurred on Tuesday and Wednesday also.  This morning when he got up he did not have it.  He had it intermittently through the day when he would move quickly or turning his head.  Up and down would cause it.  No worsening of the little headache.  No no vomiting but a little nausea.  No weakness of the arms or legs.  2 days ago he went over to the urgent care and [facility] where he was evaluated and given meclizine.  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  He has a long history of cardiovascular disease.  He has had bypass surgery, is on Cardiologic medications, and has had a vein to artery grafting done for the circulation to his leg.  He is diabetic and was recently placed on metformin.  He stopped that when he started having the dizziness thinking that was his only new medication.  Medication list was reviewed.  He has hyperlipidemia also.  Current allergies, medications, problem list, past/family and social histories reviewed.  Objective:  BP (!) 164/78 (BP Location: Left Arm, Patient Position: Sitting, Cuff Size: Large)   Pulse (!) 54   Temp 98.3 F (36.8 C) (Oral)   Resp 17   Ht 5\' 10"  (1.778 m)   Wt 242 lb (109.8 kg)   SpO2 96%   BMI 34.72 kg/m   No major acute distress.  TMs normal.  Eyes PRL.  Fundi not examined.  No nystagmus noted.  Throat clear.  Neck  supple without nodes or thyromegaly.  No carotid bruits.  Chest clear to auscultation.  Heart regular without murmur.  No arrhythmias noted.  Abdomen soft nontender.  Extremities unremarkable.  Motor strength good.  Finger-nose normal.  Romberg negative.  Heel toe gait normal.  Assessment & Plan:   Assessment: 1. Benign paroxysmal positional vertigo, unspecified laterality       Plan: I believe this is benign positional vertigo, though the distal occipital headache is atypical.  We will see what it does for a few days.  If he continues with symptoms or if symptoms worsen he should refer to a neurologist and/or get a MRI.  Blood pressure is a little elevated and will be rechecked before he leaves.  No orders of the defined types were placed in this encounter.   No orders of the defined types were placed in this encounter.        Patient Instructions    The most likely cause of this condition is benign positional vertigo.  As discussed, caution must be taken that you do not have problems with dizziness at a time that it might cause you risk, such as when you are working at heights.  Also if you are driving and get an all dizzy  pull off the road and if necessary call for help.  If the headache gets more intense please get rechecked.  In the meanwhile it is fine to take Tylenol 500 mg 2 pills 3 times daily.  Since you take the Tylenol PM, total up the amount of acetaminophen you get and do not exceed 3000 mg in 24 hours.  If symptoms continue to persist into next week, then I think it would be appropriate to refer you to a neurologist and/or go ahead and order an MRI of your head.  In the event of acute worsening symptoms go to an emergency room.   Benign Positional Vertigo Vertigo is the feeling that you or your surroundings are moving when they are not. Benign positional vertigo is the most common form of vertigo. This is usually a harmless condition (benign). This condition is  positional. This means that symptoms are triggered by certain movements and positions. This condition can be dangerous if it occurs while you are doing something that could cause harm to you or others. This includes activities such as driving or operating machinery. What are the causes? In many cases, the cause of this condition is not known. It may be caused by a disturbance in an area of the inner ear that helps your brain to sense movement and balance. This disturbance can be caused by:  Viral infection (labyrinthitis).  Head injury.  Repetitive motion, such as jumping, dancing, or running. What increases the risk? You are more likely to develop this condition if:  You are a woman.  You are 67 years of age or older. What are the signs or symptoms? Symptoms of this condition usually happen when you move your head or your eyes in different directions. Symptoms may start suddenly, and usually last for less than a minute. They include:  Loss of balance and falling.  Feeling like you are spinning or moving.  Feeling like your surroundings are spinning or moving.  Nausea and vomiting.  Blurred vision.  Dizziness.  Involuntary eye movement (nystagmus). Symptoms can be mild and cause only minor problems, or they can be severe and interfere with daily life. Episodes of benign positional vertigo may return (recur) over time. Symptoms may improve over time. How is this diagnosed? This condition may be diagnosed based on:  Your medical history.  Physical exam of the head, neck, and ears.  Tests, such as: ? MRI. ? CT scan. ? Eye movement tests. Your health care provider may ask you to change positions quickly while he or she watches you for symptoms of benign positional vertigo, such as nystagmus. Eye movement may be tested with a variety of exams that are designed to evaluate or stimulate vertigo. ? An electroencephalogram (EEG). This records electrical activity in your brain. ?  Hearing tests. You may be referred to a health care provider who specializes in ear, nose, and throat (ENT) problems (otolaryngologist) or a provider who specializes in disorders of the nervous system (neurologist). How is this treated?  This condition may be treated in a session in which your health care provider moves your head in specific positions to adjust your inner ear back to normal. Treatment for this condition may take several sessions. Surgery may be needed in severe cases, but this is rare. In some cases, benign positional vertigo may resolve on its own in 2-4 weeks. Follow these instructions at home: Safety  Move slowly. Avoid sudden body or head movements or certain positions, as told by your health  care provider.  Avoid driving until your health care provider says it is safe for you to do so.  Avoid operating heavy machinery until your health care provider says it is safe for you to do so.  Avoid doing any tasks that would be dangerous to you or others if vertigo occurs.  If you have trouble walking or keeping your balance, try using a cane for stability. If you feel dizzy or unstable, sit down right away.  Return to your normal activities as told by your health care provider. Ask your health care provider what activities are safe for you. General instructions  Take over-the-counter and prescription medicines only as told by your health care provider.  Drink enough fluid to keep your urine pale yellow.  Keep all follow-up visits as told by your health care provider. This is important. Contact a health care provider if:  You have a fever.  Your condition gets worse or you develop new symptoms.  Your family or friends notice any behavioral changes.  You have nausea or vomiting that gets worse.  You have numbness or a "pins and needles" sensation. Get help right away if you:  Have difficulty speaking or moving.  Are always dizzy.  Faint.  Develop severe  headaches.  Have weakness in your legs or arms.  Have changes in your hearing or vision.  Develop a stiff neck.  Develop sensitivity to light. Summary  Vertigo is the feeling that you or your surroundings are moving when they are not. Benign positional vertigo is the most common form of vertigo.  The cause of this condition is not known. It may be caused by a disturbance in an area of the inner ear that helps your brain to sense movement and balance.  Symptoms include loss of balance and falling, feeling that you or your surroundings are moving, nausea and vomiting, and blurred vision.  This condition can be diagnosed based on symptoms, physical exam, and other tests, such as MRI, CT scan, eye movement tests, and hearing tests.  Follow safety instructions as told by your health care provider. You will also be told when to contact your health care provider in case of problems. This information is not intended to replace advice given to you by your health care provider. Make sure you discuss any questions you have with your health care provider. Document Released: 06/01/2006 Document Revised: 02/02/2018 Document Reviewed: 02/02/2018 Elsevier Patient Education  [employer].   If you have lab work done today you will be contacted with your lab results within the next 2 weeks.  If you have not heard from [place] then please contact us. The fastest way to get your results is to register for My Chart.   IF you received an x-ray today, you will receive an invoice from [facility]. Please contact [facility] at [phone] with questions or concerns regarding your invoice.   IF you received labwork today, you will receive an invoice from [facility]. Please contact [facility] at [phone] with questions or concerns regarding your invoice.   Our billing staff will not be able to assist you with questions regarding bills from these companies.  You will be contacted with the  lab results as soon as they are available. The fastest way to get your results is to activate your My Chart account. Instructions are located on the last page of this paperwork. If you have not heard from [place] regarding the results in 2 weeks, please contact this office.  Return if symptoms worsen or fail to improve.   Janace Hoard, MD 05/11/2019

## 2019-05-22 MED FILL — ROSUVASTATIN CALCIUM 5 MG T: 5 | 56 days supply | Qty: 16 | Fill #1

## 2019-05-26 DIAGNOSIS — H5203 Hypermetropia, bilateral: Secondary | ICD-10-CM | POA: Diagnosis not present

## 2019-05-26 DIAGNOSIS — H35362 Drusen (degenerative) of macula, left eye: Secondary | ICD-10-CM | POA: Diagnosis not present

## 2019-05-26 DIAGNOSIS — H524 Presbyopia: Secondary | ICD-10-CM | POA: Diagnosis not present

## 2019-06-01 ENCOUNTER — Other Ambulatory Visit: Payer: Self-pay | Admitting: Cardiovascular Disease

## 2019-06-01 ENCOUNTER — Other Ambulatory Visit: Payer: Self-pay | Admitting: Cardiology

## 2019-06-01 MED FILL — CLOPIDOGREL 75 MG TABLET: 75 | 90 days supply | Qty: 90 | Fill #1

## 2019-06-01 MED FILL — METOPROLOL SUCCINATE ER 25: 25 | 90 days supply | Qty: 90 | Fill #0

## 2019-06-01 MED FILL — LOSARTAN POTASSIUM 50 MG TA: 50 | 90 days supply | Qty: 90 | Fill #1

## 2019-06-01 MED FILL — ISOSORBIDE MN ER 30 MG TAB: 30 | 90 days supply | Qty: 90 | Fill #0

## 2019-06-05 MED FILL — OMEPRAZOLE 20 MG CAPSULE DR: 20 | 30 days supply | Qty: 30 | Fill #1

## 2019-06-23 ENCOUNTER — Encounter: Payer: Self-pay | Admitting: Registered Nurse

## 2019-06-23 ENCOUNTER — Ambulatory Visit: Payer: 59 | Admitting: Registered Nurse

## 2019-06-23 ENCOUNTER — Other Ambulatory Visit: Payer: Self-pay

## 2019-06-23 ENCOUNTER — Telehealth: Payer: Self-pay | Admitting: *Deleted

## 2019-06-23 VITALS — BP 158/78 | HR 72 | Temp 98.0°F | Resp 16 | Ht 70.0 in | Wt 239.0 lb

## 2019-06-23 DIAGNOSIS — R519 Headache, unspecified: Secondary | ICD-10-CM | POA: Diagnosis not present

## 2019-06-23 DIAGNOSIS — H8110 Benign paroxysmal vertigo, unspecified ear: Secondary | ICD-10-CM | POA: Diagnosis not present

## 2019-06-23 DIAGNOSIS — E119 Type 2 diabetes mellitus without complications: Secondary | ICD-10-CM | POA: Diagnosis not present

## 2019-06-23 LAB — POCT GLYCOSYLATED HEMOGLOBIN (HGB A1C): Hemoglobin A1C: 7.7 % — AB (ref 4.0–5.6)

## 2019-06-23 MED ORDER — MECLIZINE HCL 25 MG PO TABS: 25.0000 mg | ORAL_TABLET | Freq: Two times a day (BID) | ORAL | 0 refills | Status: DC | PRN

## 2019-06-23 MED ORDER — DULAGLUTIDE 0.75 MG/0.5ML ~~LOC~~ SOAJ: 0.7500 mg | SUBCUTANEOUS | 0 refills | Status: DC

## 2019-06-23 MED FILL — MECLIZINE 25 MG TABLET: 25 | 45 days supply | Qty: 90 | Fill #0

## 2019-06-23 NOTE — Patient Instructions (Signed)
° ° ° °  If you have lab work done today you will be contacted with your lab results within the next 2 weeks.  If you have not heard from us then please contact us. The fastest way to get your results is to register for My Chart. ° ° °IF you received an x-ray today, you will receive an invoice from [facility]. Please contact [facility] at [phone] with questions or concerns regarding your invoice.  ° °IF you received labwork today, you will receive an invoice from [facility]. Please contact [facility] at [phone] with questions or concerns regarding your invoice.  ° °Our billing staff will not be able to assist you with questions regarding bills from these companies. ° °You will be contacted with the lab results as soon as they are available. The fastest way to get your results is to activate your My Chart account. Instructions are located on the last page of this paperwork. If you have not heard from us regarding the results in 2 weeks, please contact this office. °  ° ° ° °

## 2019-06-23 NOTE — Progress Notes (Signed)
Established Patient Office Visit  Subjective:  Patient ID: [name], male    DOB: 10/18/1950  Age: 68 y.o. MRN: [id number]  CC:  Chief Complaint  Patient presents with  . Dizziness    with a mild headace x 2 months that is starting to make him miss work     HPI Dale PineMichael L Wolfe presents for ongoing dizziness.   He reports that this has progressed over the preceding six weeks since he saw Dr. Alwyn RenHopper. He reports the meclizine has been helpful, but is too sedating to take at work. He had an EKG at a previous visit for this issue that showed no changes from previous studies. He has not seen his cardiologist, Dr. Jens Somrenshaw, in some time.  Today, he reports his nausea had progressed to some episodes of vomiting. He has stopped taking his metformin for around 3 weeks because the diarrhea was too much - otherwise, we may suspect this as a contributing factor.   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   Past Medical History:  Diagnosis Date  . Arthritis   . Asthma    occ bronchitis  . Bronchitis   . COPD (chronic obstructive pulmonary disease) (HCC)   . Coronary artery disease    a. s/p multiple prior PCIs; b. s/p CABG 06/2006 (L-LAD, S-Int and OM, S-PDA);  c. ETT-Myoview 4/13: low risk, no ischemia, EF 53%  . GERD (gastroesophageal reflux disease)   . Hx of echocardiogram    Echo 10/2006: normal LVF  . Hyperlipidemia   . Hypertension   . Myocardial infarction (HCC)   . Peripheral vascular disease (HCC)   . Seasonal allergies   . Wears dentures   . Wears glasses     Past Surgical History:  Procedure Laterality Date  . ABDOMINAL AORTOGRAM N/A 04/28/2017   Procedure: ABDOMINAL AORTOGRAM;  Surgeon: Iran OuchArida, Muhammad A, MD;  Location: [facility];  Service: Cardiovascular;  Laterality: N/A;  . ACNE CYST REMOVAL     . CARDIAC CATHETERIZATION    . COLONOSCOPY    . CORONARY ARTERY BYPASS GRAFT  2007  . CORONARY STENT INTERVENTION  01/05/2018  . CORONARY STENT INTERVENTION N/A 01/05/2018   Procedure: CORONARY STENT INTERVENTION;  Surgeon: Lennette BihariKelly, Thomas A, MD;  Location: [facility];  Service: Cardiovascular;  Laterality: N/A;  . CORONARY STENT PLACEMENT     x5 prior to cabg  . FEMORAL-POPLITEAL BYPASS GRAFT Right 06/28/2017   Procedure: BYPASS GRAFT COMMON FEMORAL- ABOVE KNEE POPLITEAL ARTERY WITH LEFT GREATER SAPHENOUS VEIN;  Surgeon: Fransisco Hertzhen, Brian L, MD;  [facility];  Service: Vascular;  Laterality: Right;  . KNEE ARTHROSCOPY     rt knee  . LEFT HEART CATH AND CORS/GRAFTS ANGIOGRAPHY N/A 01/05/2018   Procedure: LEFT HEART CATH AND CORS/GRAFTS ANGIOGRAPHY;  Surgeon: Lennette BihariKelly, Thomas A, MD;  Location: [facility];  Service: Cardiovascular;  Laterality: N/A;  . LEFT HEART CATH AND CORS/GRAFTS ANGIOGRAPHY N/A 05/27/2018   Procedure: LEFT HEART CATH AND CORS/GRAFTS ANGIOGRAPHY;  Surgeon: Lennette BihariKelly, Thomas A, MD;  Location: [facility];  Service: Cardiovascular;  Laterality: N/A;  . LOWER EXTREMITY ANGIOGRAPHY Bilateral 04/28/2017   Procedure: Lower Extremity Angiography;  Surgeon: Iran OuchArida, Muhammad A, MD;  Location: [facility];  Service: Cardiovascular;  Laterality:  Bilateral;  . MULTIPLE TOOTH EXTRACTIONS    . SHOULDER ARTHROSCOPY  12/14/2011   removed bone spurs and repositioned muscle.   . TONSILLECTOMY      Family History  Problem Relation Age of Onset  . Coronary artery disease Father        brothers, sisters  . Heart disease Father   . Diabetes Mother   . Heart disease Sister   . Heart disease Sister   . Heart disease Brother   . Anesthesia problems Neg Hx     Social History   Socioeconomic History  . Marital status: Married    Spouse name: Not on file  . Number of children: 3  . Years of education: Not on file  . Highest education level: Not on file  Occupational  History  . Occupation: RETAIL  Social Needs  . Financial resource strain: Not hard at all  . Food insecurity    Worry: Never true    Inability: Never true  . Transportation needs    Medical: No    Non-medical: No  Tobacco Use  . Smoking status: Former Smoker    Packs/day: 1.00    Types: Cigarettes    Quit date: 03/22/2017    Years since quitting: 2.2  . Smokeless tobacco: Never Used  Substance and Sexual Activity  . Alcohol use: Yes    Alcohol/week: 0.0 standard drinks    Comment: occasional - BEER OR WINE  . Drug use: No  . Sexual activity: Not Currently  Lifestyle  . Physical activity    Days per week: 3 days    Minutes per session: 30 min  . Stress: Patient refused  Relationships  . Social Musician on phone: Three times a week    Gets together: Twice a week    Attends religious service: Patient refused    Active member of club or organization: Patient refused    Attends meetings of clubs or organizations: Patient refused    Relationship status: Patient refused  . Intimate partner violence    Fear of current or ex partner: No    Emotionally abused: No    Physically abused: No    Forced sexual activity: No  Other Topics Concern  . 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     Outpatient Medications Prior to Visit  Medication Sig Dispense Refill  . albuterol (PROVENTIL HFA;VENTOLIN HFA) 108 (90 Base) MCG/ACT inhaler Inhale 2 puffs into the lungs every 4 (four) hours as needed for wheezing or shortness of breath (cough, shortness of breath or wheezing.). 1 Inhaler 0  . amLODipine (NORVASC) 10 MG tablet TAKE 1 TABLET (10 MG TOTAL) BY MOUTH DAILY. 90 tablet 2  . aspirin EC 81 MG tablet Take 81 mg by mouth daily.    . cetirizine (ZYRTEC) 10 MG tablet Take 10 mg by mouth daily.     . clopidogrel (PLAVIX) 75 MG tablet Take 1 tablet (75 mg total) by mouth daily. 90  tablet 3  . diphenhydramine-acetaminophen (TYLENOL PM) 25-500 MG TABS tablet Take 2 tablets by mouth at bedtime.    . isosorbide mononitrate (IMDUR) 30 MG 24 hr tablet TAKE 1 TABLET BY MOUTH DAILY. 90 tablet 0  . metoprolol succinate (TOPROL-XL) 25 MG 24 hr tablet TAKE 1 TABLET BY MOUTH DAILY. TAKE WITH OR IMMEDIATELY FOLLOWING A MEAL. 90 tablet 0  . nitroGLYCERIN (  NITROSTAT) 0.4 MG SL tablet Place 1 tablet (0.4 mg total) under the tongue every 5 (five) minutes as needed for chest pain. 25 tablet 2  . omeprazole (PRILOSEC) 20 MG capsule TAKE 1 CAPSULE BY MOUTH DAILY. 30 capsule 2  . rosuvastatin (CRESTOR) 5 MG tablet TAKE 1 TABLET BY MOUTH EVERY MONDAY AND FRIDAY 16 tablet 4  . meclizine (ANTIVERT) 25 MG tablet Take 1 tablet (25 mg total) by mouth 2 (two) times daily as needed for dizziness. 15 tablet 0  . losartan (COZAAR) 50 MG tablet Take 1 tablet (50 mg total) by mouth every evening. 90 tablet 3  . metFORMIN (GLUCOPHAGE) 500 MG tablet Take 1 tablet (500 mg total) by mouth 2 (two) times daily with a meal. (Patient not taking: Reported on 06/23/2019) 180 tablet 0   No facility-administered medications prior to visit.     Allergies  Allergen Reactions  . Lipitor [Atorvastatin Calcium]     Leg cramps  . Neosporin [Neomycin-Bacitracin Zn-Polymyx] Dermatitis  . Codeine Nausea And Vomiting    ROS Review of Systems  Constitutional: Positive for fatigue. Negative for fever and unexpected weight change.  HENT: Negative.   Eyes: Negative.  Negative for visual disturbance.  Respiratory: Negative.   Cardiovascular: Negative.   Gastrointestinal: Positive for nausea. Negative for abdominal pain, diarrhea and vomiting.  Endocrine: Negative.   Genitourinary: Negative.   Musculoskeletal: Negative.   Skin: Negative.   Allergic/Immunologic: Negative.   Neurological: Positive for dizziness and headaches. Negative for seizures, syncope, weakness and numbness.  Hematological: Negative.    Psychiatric/Behavioral: Negative.   All other systems reviewed and are negative.     Objective:    Physical Exam  Constitutional: He is oriented to person, place, and time. He appears well-developed and well-nourished. No distress.  Cardiovascular: Normal rate and regular rhythm.  Pulmonary/Chest: Effort normal. No respiratory distress.  Neurological: He is alert and oriented to person, place, and time. He has normal strength. No cranial nerve deficit or sensory deficit. GCS eye subscore is 4. GCS verbal subscore is 5. GCS motor subscore is 6.  Skin: Skin is warm and dry. No rash noted. He is not diaphoretic. No erythema. No pallor.  Psychiatric: He has a normal mood and affect. His behavior is normal. Judgment and thought content normal.  Nursing note and vitals reviewed.   BP (!) 158/78   Pulse 72   Temp 98 F (36.7 C) (Oral)   Resp 16   Ht 5\' 10"  (1.778 m)   Wt 239 lb (108.4 kg)   SpO2 96%   BMI 34.29 kg/m  Wt Readings from Last 3 Encounters:  06/23/19 239 lb (108.4 kg)  05/11/19 242 lb (109.8 kg)  05/08/19 240 lb (108.9 kg)     Health Maintenance Due  Topic Date Due  . OPHTHALMOLOGY EXAM  06/07/1962  . COLONOSCOPY  02/14/2017  . PNA vac Low Risk Adult (1 of 2 - PCV13) 06/07/2017  . FOOT EXAM  03/31/2018  . URINE MICROALBUMIN  03/31/2018    There are no preventive care reminders to display for this patient.  Lab Results  Component Value Date   TSH 1.620 02/22/2015   Lab Results  Component Value Date   WBC 7.5 04/07/2019   HGB 13.3 04/07/2019   HCT 40.2 04/07/2019   MCV 89 04/07/2019   PLT 216 04/07/2019   Lab Results  Component Value Date   NA 139 04/07/2019   K 3.9 04/07/2019   CO2 21 04/07/2019  GLUCOSE 151 (H) 04/07/2019   BUN 22 04/07/2019   CREATININE 1.08 04/07/2019   BILITOT 0.2 04/07/2019   ALKPHOS 131 (H) 04/07/2019   AST 22 04/07/2019   ALT 38 04/07/2019   PROT 6.7 04/07/2019   ALBUMIN 4.2 04/07/2019   CALCIUM 9.2 04/07/2019    ANIONGAP 11 05/27/2018   GFR 79.65 09/22/2010   Lab Results  Component Value Date   CHOL 210 (H) 04/07/2019   Lab Results  Component Value Date   HDL 34 (L) 04/07/2019   Lab Results  Component Value Date   LDLCALC Comment 04/07/2019   Lab Results  Component Value Date   TRIG 421 (H) 04/07/2019   Lab Results  Component Value Date   CHOLHDL 6.2 (H) 04/07/2019   Lab Results  Component Value Date   HGBA1C 7.7 (A) 06/23/2019      Assessment & Plan:   Problem List Items Addressed This Visit    None    Visit Diagnoses    Type 2 diabetes mellitus without complication, without long-term current use of insulin (HCC)    -  Primary   Relevant Orders   POCT glycosylated hemoglobin (Hb A1C) (Completed)   Benign paroxysmal positional vertigo, unspecified laterality       Relevant Medications   meclizine (ANTIVERT) 25 MG tablet   Other Relevant Orders   Ambulatory referral to Neurology   Recurrent occipital headache       Relevant Orders   Ambulatory referral to Neurology      Meds ordered this encounter  Medications  . meclizine (ANTIVERT) 25 MG tablet    Sig: Take 1 tablet (25 mg total) by mouth 2 (two) times daily as needed for dizziness.    Dispense:  90 tablet    Refill:  0    Order Specific Question:   Supervising Provider    Answer:   Delia Chimes A [id number]  . DISCONTD: Dulaglutide 0.75 MG/0.5ML SOPN    Sig: Inject 0.75 mg into the skin once a week.    Dispense:  12 pen    Refill:  0    Order Specific Question:   Supervising Provider    Answer:   Forrest Moron [id number]    Follow-up: No follow-ups on file.   PLAN  Refer to Neuro  I sent a message to Dr. Stanford Breed, as well as encourage the patient directly to make a follow up appointment. I have concern that this patient is heading towards another adverse cardiovascular event, even if unrelated to his current symptoms. His increase in use of nitroglycerin is concerning.  A1c in office shows  improvement to 7.7. This helps [place] rule out a blood glucose etiology for his symptoms, in particular since symptoms are not associated with meals and he has stopped his metformin. However, he does not check sugars at home, as such, it is impossible to rule this out entirely. At this time, he refuses further treatment for his T2DM and instead promises to make significant diet and lifestyle changes to control this. I agreed to this with the condition that he return in 3 months for a check of his A1c - should it remain the same or rise, we will be forced to find an agent that will work for him. He cannot tolerate metformin and is opposed to injections.   Will follow closely regarding visits to cardiology and neurology, determine further steps following those visits  Patient encouraged to call clinic with any questions, comments, or concerns.  Maximiano Coss, NP

## 2019-06-23 NOTE — Telephone Encounter (Addendum)
Left message for pt to call   ----- Message from Lelon Perla, MD sent at 06/23/2019 12:19 PM EDT ----- Regarding: RE: Mutual Patient I will have my nurse schedule fu ov with APP Kirk Ruths ----- Message ----- From: Maximiano Coss, NP Sent: 06/23/2019  11:59 AM EDT To: Lelon Perla, MD Subject: Mutual Patient                                 Dr. Stanford Breed,  I'm writing in the interest of our mutual patient (attached). He states it has been some time since he's seen you.  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 With this, he's had a lingering occipital headache. 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 He has been taking nitroglycerin for this with good effect, but taking it more often than he has ever had to before. He's had EKGs that show no substantial changes from previous studies.  We are working on controlling his T2DM in primary care, and he will receive a referral to neurology from [place] today. I was hoping you or a member of your staff could reach out to Mr. Archey to have him scheduled in your office.   Thank you,  Kathrin Ruddy, NP

## 2019-07-20 NOTE — Telephone Encounter (Signed)
Ov 09/18/2019

## 2019-07-28 MED FILL — ROSUVASTATIN CALCIUM 5 MG T: 5 | 56 days supply | Qty: 16 | Fill #2

## 2019-08-08 ENCOUNTER — Encounter: Payer: Self-pay | Admitting: *Deleted

## 2019-08-08 ENCOUNTER — Other Ambulatory Visit: Payer: Self-pay | Admitting: Adult Health

## 2019-08-09 MED FILL — AMLODIPINE BESYLATE 10 MG T: 10 | 90 days supply | Qty: 90 | Fill #0

## 2019-08-10 DIAGNOSIS — H02834 Dermatochalasis of left upper eyelid: Secondary | ICD-10-CM | POA: Diagnosis not present

## 2019-08-10 DIAGNOSIS — H0279 Other degenerative disorders of eyelid and periocular area: Secondary | ICD-10-CM | POA: Diagnosis not present

## 2019-08-10 DIAGNOSIS — H02413 Mechanical ptosis of bilateral eyelids: Secondary | ICD-10-CM | POA: Diagnosis not present

## 2019-08-10 DIAGNOSIS — L711 Rhinophyma: Secondary | ICD-10-CM | POA: Diagnosis not present

## 2019-08-10 DIAGNOSIS — H02831 Dermatochalasis of right upper eyelid: Secondary | ICD-10-CM | POA: Diagnosis not present

## 2019-08-10 DIAGNOSIS — H02423 Myogenic ptosis of bilateral eyelids: Secondary | ICD-10-CM | POA: Diagnosis not present

## 2019-08-10 DIAGNOSIS — H53483 Generalized contraction of visual field, bilateral: Secondary | ICD-10-CM | POA: Diagnosis not present

## 2019-08-10 DIAGNOSIS — H02835 Dermatochalasis of left lower eyelid: Secondary | ICD-10-CM | POA: Diagnosis not present

## 2019-08-10 DIAGNOSIS — H02832 Dermatochalasis of right lower eyelid: Secondary | ICD-10-CM | POA: Diagnosis not present

## 2019-08-16 DIAGNOSIS — H53483 Generalized contraction of visual field, bilateral: Secondary | ICD-10-CM | POA: Diagnosis not present

## 2019-08-22 ENCOUNTER — Other Ambulatory Visit: Payer: Self-pay

## 2019-08-22 ENCOUNTER — Encounter: Payer: Self-pay | Admitting: Diagnostic Neuroimaging

## 2019-08-22 ENCOUNTER — Ambulatory Visit (INDEPENDENT_AMBULATORY_CARE_PROVIDER_SITE_OTHER): Payer: 59 | Admitting: Diagnostic Neuroimaging

## 2019-08-22 VITALS — BP 130/72 | HR 80 | Temp 97.8°F | Ht 70.0 in | Wt 243.0 lb

## 2019-08-22 DIAGNOSIS — H8110 Benign paroxysmal vertigo, unspecified ear: Secondary | ICD-10-CM

## 2019-08-22 NOTE — Progress Notes (Signed)
[facility]  PATIENT: [name] DOB: November 11, 1950  REFERRING CLINICIAN: Janeece Agee, NP HISTORY FROM: patient  REASON FOR VISIT: new consult   HISTORICAL  CHIEF COMPLAINT:  Chief Complaint  Patient presents with   Vertigo, headaches    rm 7 New Pt  "was on Brilinta which caused dizziness, off med now but I get episodes of room spinning; Meclizine causes headache"    HISTORY OF PRESENT ILLNESS:   68 year old male here for evaluation of dizziness spells.  2019 patient was on Brilinta, then developed vertigo and was switched to Plavix which improved symptoms.  Symptoms eventually resolved.  August 2020 patient had recurrence of vertigo symptoms.  Symptoms worse when laying down, rolling over, looking up or down or side to side.  Patient tried meclizine without relief.  In last few weeks symptoms have slightly improved.  No headache, vision changes, numbness or tingling.  No other triggering or aggravating factors.   REVIEW OF SYSTEMS: Full 14 system review of systems performed and negative with exception of: Sleepiness dizziness.  Coronary artery disease status post CABG.  History of stents.  ALLERGIES: Allergies  Allergen Reactions   Lipitor [Atorvastatin Calcium]     Leg cramps   Neosporin [Neomycin-Bacitracin Zn-Polymyx] Dermatitis   Codeine Nausea And Vomiting    HOME MEDICATIONS: Outpatient Medications Prior to Visit  Medication Sig Dispense Refill   albuterol (PROVENTIL HFA;VENTOLIN HFA) 108 (90 Base) MCG/ACT inhaler Inhale 2 puffs into the lungs every 4 (four) hours as needed for wheezing or shortness of breath (cough, shortness of breath or wheezing.). 1 Inhaler 0   amLODipine (NORVASC) 10 MG tablet TAKE 1 TABLET BY MOUTH DAILY. 90 tablet 0   aspirin EC 81 MG tablet Take 81 mg by mouth daily.     cetirizine (ZYRTEC) 10 MG tablet Take 10 mg by mouth daily.      clopidogrel (PLAVIX) 75 MG tablet Take 1 tablet (75 mg total)  by mouth daily. 90 tablet 3   diphenhydramine-acetaminophen (TYLENOL PM) 25-500 MG TABS tablet Take 2 tablets by mouth at bedtime.     isosorbide mononitrate (IMDUR) 30 MG 24 hr tablet TAKE 1 TABLET BY MOUTH DAILY. 90 tablet 0   meclizine (ANTIVERT) 25 MG tablet Take 1 tablet (25 mg total) by mouth 2 (two) times daily as needed for dizziness. 90 tablet 0   metoprolol succinate (TOPROL-XL) 25 MG 24 hr tablet TAKE 1 TABLET BY MOUTH DAILY. TAKE WITH OR IMMEDIATELY FOLLOWING A MEAL. 90 tablet 0   nitroGLYCERIN (NITROSTAT) 0.4 MG SL tablet Place 1 tablet (0.4 mg total) under the tongue every 5 (five) minutes as needed for chest pain. 25 tablet 2   omeprazole (PRILOSEC) 20 MG capsule TAKE 1 CAPSULE BY MOUTH DAILY. 30 capsule 2   rosuvastatin (CRESTOR) 5 MG tablet TAKE 1 TABLET BY MOUTH EVERY MONDAY AND FRIDAY 16 tablet 4   losartan (COZAAR) 50 MG tablet Take 1 tablet (50 mg total) by mouth every evening. 90 tablet 3   metFORMIN (GLUCOPHAGE) 500 MG tablet Take 1 tablet (500 mg total) by mouth 2 (two) times daily with a meal. (Patient not taking: Reported on 06/23/2019) 180 tablet 0   No facility-administered medications prior to visit.    PAST MEDICAL HISTORY: Past Medical History:  Diagnosis Date   Arthritis    Asthma    occ bronchitis   Bronchitis    COPD (chronic obstructive pulmonary disease) (HCC)    Coronary artery disease    a.  s/p multiple prior PCIs; b. s/p CABG 06/2006 (L-LAD, S-Int and OM, S-PDA);  c. ETT-Myoview 4/13: low risk, no ischemia, EF 53%   Dizziness    GERD (gastroesophageal reflux disease)    Hx of echocardiogram    Echo 10/2006: normal LVF   Hyperlipidemia    Hypertension    Myocardial infarction [facility])    Peripheral vascular disease ([place])    Seasonal allergies    Wears dentures    Wears glasses     PAST SURGICAL HISTORY: Past Surgical History:  Procedure Laterality Date   ABDOMINAL AORTOGRAM N/A 04/28/2017   Procedure: ABDOMINAL  AORTOGRAM;  Surgeon: Wellington Hampshire, MD;  Location: [place] [facility];  Service: Cardiovascular;  Laterality: N/A;   ACNE CYST REMOVAL     CARDIAC CATHETERIZATION     COLONOSCOPY     CORONARY ARTERY BYPASS GRAFT  2007   CORONARY STENT INTERVENTION  01/05/2018   CORONARY STENT INTERVENTION N/A 01/05/2018   Procedure: CORONARY STENT INTERVENTION;  Surgeon: Troy Sine, MD;  Location: [place] [facility];  Service: Cardiovascular;  Laterality: N/A;   CORONARY STENT PLACEMENT     x5 prior to cabg   FEMORAL-POPLITEAL BYPASS GRAFT Right 06/28/2017   Procedure: BYPASS GRAFT COMMON FEMORAL- ABOVE KNEE POPLITEAL ARTERY WITH LEFT GREATER SAPHENOUS VEIN;  Surgeon: Conrad [place], MD;  Location: [place];  Service: Vascular;  Laterality: Right;   KNEE ARTHROSCOPY     rt knee   LEFT HEART CATH AND CORS/GRAFTS ANGIOGRAPHY N/A 01/05/2018   Procedure: LEFT HEART CATH AND CORS/GRAFTS ANGIOGRAPHY;  Surgeon: Troy Sine, MD;  Location: [place] [facility];  Service: Cardiovascular;  Laterality: N/A;   LEFT HEART CATH AND CORS/GRAFTS ANGIOGRAPHY N/A 05/27/2018   Procedure: LEFT HEART CATH AND CORS/GRAFTS ANGIOGRAPHY;  Surgeon: Troy Sine, MD;  Location: [place] [facility];  Service: Cardiovascular;  Laterality: N/A;   LOWER EXTREMITY ANGIOGRAPHY Bilateral 04/28/2017   Procedure: Lower Extremity Angiography;  Surgeon: Wellington Hampshire, MD;  Location: [place] [facility];  Service: Cardiovascular;  Laterality: Bilateral;   MULTIPLE TOOTH EXTRACTIONS     SHOULDER ARTHROSCOPY  12/14/2011   removed bone spurs and repositioned muscle.    TONSILLECTOMY      FAMILY HISTORY: Family History  Problem Relation Age of Onset   Coronary artery disease Father        brothers, sisters   Heart disease Father    Diabetes Mother    Diabetes type II Mother    Heart disease Sister    Heart disease Sister    Heart disease Brother    Anesthesia problems Neg Hx     SOCIAL HISTORY: Social  History   Socioeconomic History   Marital status: Married    Spouse name: Benjamine Mola   Number of children: 3   Years of education: 14   Highest education level: Not on file  Occupational History   Occupation: RETAIL    Comment: retired  Tobacco Use   Smoking status: Former Smoker    Packs/day: 1.00    Types: Cigarettes    Quit date: 03/22/2016    Years since quitting: 3.4   Smokeless tobacco: Never Used  Substance and Sexual Activity   Alcohol use: Yes    Alcohol/week: 0.0 standard drinks    Comment: occasional - BEER OR WINE   Drug use: No   Sexual activity: Not Currently  Other Topics Concern   Not on file  Social History Narrative   Works  and does a lot of overhead lifting, activity, 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    Social Determinants of Health   Financial Resource Strain: Low Risk    Difficulty of Paying Living Expenses: Not hard at all  Food Insecurity: No Food Insecurity   Worried About Programme researcher, broadcasting/film/videounning Out of Food in the Last Year: Never true   Ran Out of Food in the Last Year: Never true  Transportation Needs: No Transportation Needs   Lack of Transportation (Medical): No   Lack of Transportation (Non-Medical): No  Physical Activity: Insufficiently Active   Days of Exercise per Week: 3 days   Minutes of Exercise per Session: 30 min  Stress: Unknown   Feeling of Stress : Patient refused  Social Connections: Unknown   Frequency of Communication with Friends and Family: Three times a week   Frequency of Social Gatherings with Friends and Family: Twice a week   Attends Religious Services: Patient refused   Database administratorActive Member of Clubs or Organizations: Patient refused   Attends Engineer, structuralClub or Organization Meetings: Patient refused   Marital Status: Patient refused  Catering managerntimate Partner Violence: Not At Risk   Fear of Current or Ex-Partner: No   Emotionally Abused: No   Physically Abused: No   Sexually Abused: No     PHYSICAL EXAM  GENERAL  EXAM/CONSTITUTIONAL: Vitals:  Vitals:   08/22/19 1244  BP: 130/72  Pulse: 80  Temp: 97.8 F (36.6 C)  Weight: 243 lb (110.2 kg)  Height: 5\' 10"  (1.778 m)     Body mass index is 34.87 kg/m. Wt Readings from Last 3 Encounters:  08/22/19 243 lb (110.2 kg)  06/23/19 239 lb (108.4 kg)  05/11/19 242 lb (109.8 kg)     Patient is in no distress; well developed, nourished and groomed; neck is supple  CARDIOVASCULAR:  Examination of carotid arteries is normal; no carotid bruits  Regular rate and rhythm, no murmurs  Examination of peripheral vascular system by observation and palpation is normal  EYES:  Ophthalmoscopic exam of optic discs and posterior segments is normal; no papilledema or hemorrhages  No exam data present  MUSCULOSKELETAL:  Gait, strength, tone, movements noted in Neurologic exam below  NEUROLOGIC: MENTAL STATUS:  No flowsheet data found.  awake, alert, oriented to person, place and time  recent and remote memory intact  normal attention and concentration  language fluent, comprehension intact, naming intact  fund of knowledge appropriate  CRANIAL NERVE:   2nd - no papilledema on fundoscopic exam  2nd, 3rd, 4th, 6th - pupils equal and reactive to light, visual fields full to confrontation, extraocular muscles intact, no nystagmus  5th - facial sensation symmetric  7th - facial strength symmetric  8th - hearing intact  9th - palate elevates symmetrically, uvula midline  11th - shoulder shrug symmetric  12th - tongue protrusion midline  MOTOR:   normal bulk and tone, full strength in the BUE, BLE  SENSORY:   normal and symmetric to light touch, temperature, vibration  COORDINATION:   finger-nose-finger, fine finger movements normal  REFLEXES:   deep tendon reflexes present and symmetric  GAIT/STATION:   narrow based gait; romberg is negative     DIAGNOSTIC DATA (LABS, IMAGING, TESTING) - I reviewed patient records,  labs, notes, testing and imaging myself where available.  Lab Results  Component Value Date   WBC 7.5 04/07/2019   HGB 13.3 04/07/2019   HCT 40.2 04/07/2019   MCV 89 04/07/2019   PLT 216 04/07/2019  Component Value Date/Time   NA 139 04/07/2019 1354   K 3.9 04/07/2019 1354   CL 100 04/07/2019 1354   CO2 21 04/07/2019 1354   GLUCOSE 151 (H) 04/07/2019 1354   GLUCOSE 134 (H) 05/27/2018 0603   BUN 22 04/07/2019 1354   CREATININE 1.08 04/07/2019 1354   CREATININE 0.82 06/04/2016 1131   CALCIUM 9.2 04/07/2019 1354   PROT 6.7 04/07/2019 1354   ALBUMIN 4.2 04/07/2019 1354   AST 22 04/07/2019 1354   ALT 38 04/07/2019 1354   ALKPHOS 131 (H) 04/07/2019 1354   BILITOT 0.2 04/07/2019 1354   GFRNONAA 71 04/07/2019 1354   GFRNONAA >89 10/03/2014 1042   GFRAA 82 04/07/2019 1354   GFRAA >89 10/03/2014 1042   Lab Results  Component Value Date   CHOL 210 (H) 04/07/2019   HDL 34 (L) 04/07/2019   LDLCALC Comment 04/07/2019   LDLDIRECT 91.0 06/16/2013   TRIG 421 (H) 04/07/2019   CHOLHDL 6.2 (H) 04/07/2019   Lab Results  Component Value Date   HGBA1C 7.7 (A) 06/23/2019   No results found for: VITAMINB12 Lab Results  Component Value Date   TSH 1.620 02/22/2015       ASSESSMENT AND PLAN  67 y.o. year old male here with intermittent positional vertigo, likely BPPV, spontaneously improving.  Monitor for now.  If symptoms recur may consider vestibular therapy referral and meclizine.   Dx:  1. Benign paroxysmal positional vertigo, unspecified laterality     PLAN:  BENIGN POSITIONAL VERTIGO - improving over last 1-2 months - monitor symptoms - meclizine as needed  Return for pending if symptoms worsen or fail to improve.    Suanne Marker, MD 08/22/2019, 1:25 PM Certified in Neurology, Neurophysiology and Neuroimaging  [facility] [address], [address] [place], [place] [address] [phone]

## 2019-08-24 ENCOUNTER — Encounter: Payer: Self-pay | Admitting: Diagnostic Neuroimaging

## 2019-09-04 ENCOUNTER — Other Ambulatory Visit: Payer: Self-pay | Admitting: Cardiology

## 2019-09-04 ENCOUNTER — Other Ambulatory Visit: Payer: Self-pay | Admitting: Cardiovascular Disease

## 2019-09-04 MED FILL — LOSARTAN POTASSIUM 50 MG TA: 50 | 90 days supply | Qty: 90 | Fill #0

## 2019-09-04 MED FILL — METOPROLOL SUCCINATE ER 25: 25 | 30 days supply | Qty: 30 | Fill #0

## 2019-09-04 MED FILL — ISOSORBIDE MN ER 30 MG TAB: 30 | 90 days supply | Qty: 90 | Fill #0

## 2019-09-04 MED FILL — CLOPIDOGREL 75 MG TABLET: 75 | 90 days supply | Qty: 90 | Fill #0

## 2019-09-05 ENCOUNTER — Telehealth: Payer: Self-pay | Admitting: *Deleted

## 2019-09-05 NOTE — Telephone Encounter (Signed)
   [facility] Pre-operative Risk Assessment    Request for surgical clearance:  1. What type of surgery is being performed? B/L UPPER EYELID BLEPHAROPLASTY AND DIRECT BROW LIFT    2. When is this surgery scheduled? 10/02/19   3. What type of clearance is required (medical clearance vs. Pharmacy clearance to hold med vs. Both)? MEDICAL  4. Are there any medications that need to be held prior to surgery and how long? ASA AND PLAVIX   5. Practice name and name of physician performing surgery? [facility]; DR. Elayne Snare ZALDIVAR   6. What is your office phone number [phone]    7.   What is your office fax number [phone]  8.   Anesthesia type (None, local, MAC, general) ? MAC   Julaine Hua 09/05/2019, 3:02 PM  _________________________________________________________________   (provider comments below)

## 2019-09-05 NOTE — Telephone Encounter (Signed)
This patient has an office visit with dr Stanford Breed 09/18/2019.  Pre op clearance and Plavix/ASA can be addressed at that time.  I will leave the encounter open.   Kerin Ransom PA-C 09/05/2019 3:23 PM

## 2019-09-11 NOTE — Progress Notes (Signed)
HPI: FU CAD; s/p multiple prior PCIs, s/p subsequent CABG 06/2006 (LIMA to the LAD, sequential saphenous vein graft to the intermediate and obtuse marginal, and a saphenous vein graft to the PDA). Carotid Dopplers in April of 2005 showed normal arteries.  Had PCI of the ostium of the vein graft supplying the OM1 May 2019.  Last cardiac catheterization September 2019 showed occluded right coronary artery, 40% left main, occluded first marginal, occluded third marginal, 45% LAD, 99% ostial first diagonal, patent LIMA to the LAD, patent sequential saphenous vein graft to the ramus and obtuse marginal with previously noted occluded sequential limb, occluded vein graft to the PDA.  Medical therapy recommended.  Echocardiogram September 2019 showed normal LV function, mild diastolic dysfunction and mild right atrial enlargement. Abdominal CTA December 2019 showed 3.7 cm infrarenal abdominal aortic aneurysm.  Since last seen patient has some chest tightness with more vigorous activities.  He does not have the symptoms at rest.  Mild dyspnea on exertion but no orthopnea, PND, pedal edema or syncope.  Current Outpatient Medications  Medication Sig Dispense Refill  . albuterol (PROVENTIL HFA;VENTOLIN HFA) 108 (90 Base) MCG/ACT inhaler Inhale 2 puffs into the lungs every 4 (four) hours as needed for wheezing or shortness of breath (cough, shortness of breath or wheezing.). 1 Inhaler 0  . amLODipine (NORVASC) 10 MG tablet TAKE 1 TABLET BY MOUTH DAILY. 90 tablet 0  . aspirin EC 81 MG tablet Take 81 mg by mouth daily.    . cetirizine (ZYRTEC) 10 MG tablet Take 10 mg by mouth daily.     . clopidogrel (PLAVIX) 75 MG tablet TAKE 1 TABLET (75 MG TOTAL) BY MOUTH DAILY. 90 tablet 1  . diphenhydramine-acetaminophen (TYLENOL PM) 25-500 MG TABS tablet Take 2 tablets by mouth at bedtime.    . isosorbide mononitrate (IMDUR) 30 MG 24 hr tablet TAKE 1 TABLET BY MOUTH DAILY. 90 tablet 0  . losartan (COZAAR) 50 MG tablet  TAKE 1 TABLET (50 MG TOTAL) BY MOUTH EVERY EVENING. 90 tablet 1  . meclizine (ANTIVERT) 25 MG tablet Take 1 tablet (25 mg total) by mouth 2 (two) times daily as needed for dizziness. 90 tablet 0  . metoprolol succinate (TOPROL-XL) 25 MG 24 hr tablet Take 1 tablet (25 mg total) by mouth daily. KEEP OV. 30 tablet 0  . nitroGLYCERIN (NITROSTAT) 0.4 MG SL tablet Place 1 tablet (0.4 mg total) under the tongue every 5 (five) minutes as needed for chest pain. 25 tablet 2  . omeprazole (PRILOSEC) 20 MG capsule TAKE 1 CAPSULE BY MOUTH DAILY. 30 capsule 2  . rosuvastatin (CRESTOR) 5 MG tablet TAKE 1 TABLET BY MOUTH EVERY MONDAY AND FRIDAY 16 tablet 4   No current facility-administered medications for this visit.     Past Medical History:  Diagnosis Date  . Arthritis   . Asthma    occ bronchitis  . Bronchitis   . COPD (chronic obstructive pulmonary disease) (HCC)   . Coronary artery disease    a. s/p multiple prior PCIs; b. s/p CABG 06/2006 (L-LAD, S-Int and OM, S-PDA);  c. ETT-Myoview 4/13: low risk, no ischemia, EF 53%  . Dizziness   . GERD (gastroesophageal reflux disease)   . Hx of echocardiogram    Echo 10/2006: normal LVF  . Hyperlipidemia   . Hypertension   . Myocardial infarction (HCC)   . Peripheral vascular disease (HCC)   . Seasonal allergies   . Wears dentures   . Wears glasses  Past Surgical History:  Procedure Laterality Date  . ABDOMINAL AORTOGRAM N/A 04/28/2017   Procedure: ABDOMINAL AORTOGRAM;  Surgeon: Iran Ouch, MD;  [facility]: [facility];  Service: Cardiovascular;  Laterality: N/A;  . ACNE CYST REMOVAL    . CARDIAC CATHETERIZATION    . COLONOSCOPY    . CORONARY ARTERY BYPASS GRAFT  2007  . CORONARY STENT INTERVENTION  01/05/2018  . CORONARY STENT INTERVENTION N/A 01/05/2018   Procedure: CORONARY STENT INTERVENTION;  Surgeon: Lennette Bihari, MD;  Location: [facility];  Service: Cardiovascular;  Laterality: N/A;  . CORONARY STENT PLACEMENT      x5 prior to cabg  . FEMORAL-POPLITEAL BYPASS GRAFT Right 06/28/2017   Procedure: BYPASS GRAFT COMMON FEMORAL- ABOVE KNEE POPLITEAL ARTERY WITH LEFT GREATER SAPHENOUS VEIN;  Surgeon: Fransisco Hertz, MD;  Location: [facility];  Service: Vascular;  Laterality: Right;  . KNEE ARTHROSCOPY     rt knee  . LEFT HEART CATH AND CORS/GRAFTS ANGIOGRAPHY N/A 01/05/2018   Procedure: LEFT HEART CATH AND CORS/GRAFTS ANGIOGRAPHY;  Surgeon: Lennette Bihari, MD;  Location: [facility];  Service: Cardiovascular;  Laterality: N/A;  . LEFT HEART CATH AND CORS/GRAFTS ANGIOGRAPHY N/A 05/27/2018   Procedure: LEFT HEART CATH AND CORS/GRAFTS ANGIOGRAPHY;  Surgeon: Lennette Bihari, MD;  Location: [facility];  Service: Cardiovascular;  Laterality: N/A;  . LOWER EXTREMITY ANGIOGRAPHY Bilateral 04/28/2017   Procedure: Lower Extremity Angiography;  Surgeon: Iran Ouch, MD;  Location: [facility];  Service: Cardiovascular;  Laterality: Bilateral;  . MULTIPLE TOOTH EXTRACTIONS    . SHOULDER ARTHROSCOPY  12/14/2011   removed bone spurs and repositioned muscle.   . TONSILLECTOMY      Social History   Socioeconomic History  . Marital status: Married    Spouse name: Lanora Manis  . Number of children: 3  . Years of education: 58  . Highest education level: Not on file  Occupational History  . Occupation: RETAIL    Comment: retired  Tobacco Use  . Smoking status: Former Smoker    Packs/day: 1.00    Types: Cigarettes    Quit date: 03/22/2016    Years since quitting: 3.4  . Smokeless tobacco: Never Used  Substance and Sexual Activity  . Alcohol use: Yes    Alcohol/week: 0.0 standard drinks    Comment: occasional - BEER OR WINE  . Drug use: No  . Sexual activity: Not Currently  Other Topics Concern  . 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    Social Determinants of Health   Financial Resource Strain:  Low Risk   . Difficulty of Paying Living Expenses: Not hard at all  Food Insecurity: No Food Insecurity  . Worried About Programme researcher, broadcasting/film/video in the Last Year: Never true  . Ran Out of Food in the Last Year: Never true  Transportation Needs: No Transportation Needs  . Lack of Transportation (Medical): No  . Lack of Transportation (Non-Medical): No  Physical Activity: Insufficiently Active  . Days of Exercise per Week: 3 days  . Minutes of Exercise per Session: 30 min  Stress: Unknown  . Feeling of Stress : Patient refused  Social Connections: Unknown  . Frequency of Communication with Friends and Family: Three times a week  . Frequency of Social Gatherings with Friends and Family: Twice a week  . Attends Religious  Services: Patient refused  . Active Member of Clubs or Organizations: Patient refused  . Attends Archivist Meetings: Patient refused  . Marital Status: Patient refused  Intimate Partner Violence: Not At Risk  . Fear of Current or Ex-Partner: No  . Emotionally Abused: No  . Physically Abused: No  . Sexually Abused: No    Family History  Problem Relation Age of Onset  . Coronary artery disease Father        brothers, sisters  . Heart disease Father   . Diabetes Mother   . Diabetes type II Mother   . Heart disease Sister   . Heart disease Sister   . Heart disease Brother   . Anesthesia problems Neg Hx     ROS: no fevers or chills, productive cough, hemoptysis, dysphasia, odynophagia, melena, hematochezia, dysuria, hematuria, rash, seizure activity, orthopnea, PND, pedal edema, claudication. Remaining systems are negative.  Physical Exam: Well-developed well-nourished in no acute distress.  Skin is warm and dry.  HEENT is normal.  Neck is supple.  Chest is clear to auscultation with normal expansion.  Cardiovascular exam is regular rate and rhythm.  Abdominal exam nontender or distended. No masses palpated. Extremities show no edema. neuro grossly  intact   A/P  1 coronary artery disease-plan to continue medical therapy with aspirin and statin.  Discontinue plavix.  Patient describes some chest discomfort with vigorous activities.  This appears to be longstanding.  We discussed repeating a stress nuclear study but he declined.  Continue medical therapy.  2 abdominal aortic aneurysm-repeat CTA.  3 hypertension-blood pressure elevated; increase losartan to 100 mg daily.  Check potassium and renal function in 1 week.  4 Hyperlipidemia-he has not tolerated higher doses of statins or Zetia in the past.  I discussed Repatha but he declines preferring to try diet.  5 peripheral vascular disease-followed by vascular surgery.  Continue medical therapy with aspirin and statin.  Kirk Ruths, MD

## 2019-09-18 ENCOUNTER — Encounter: Payer: Self-pay | Admitting: Cardiology

## 2019-09-18 ENCOUNTER — Other Ambulatory Visit: Payer: Self-pay

## 2019-09-18 ENCOUNTER — Ambulatory Visit (INDEPENDENT_AMBULATORY_CARE_PROVIDER_SITE_OTHER): Payer: 59 | Admitting: Cardiology

## 2019-09-18 ENCOUNTER — Telehealth: Payer: Self-pay | Admitting: Cardiology

## 2019-09-18 VITALS — BP 162/78 | HR 58 | Ht 70.0 in | Wt 242.4 lb

## 2019-09-18 DIAGNOSIS — I714 Abdominal aortic aneurysm, without rupture, unspecified: Secondary | ICD-10-CM

## 2019-09-18 DIAGNOSIS — I2510 Atherosclerotic heart disease of native coronary artery without angina pectoris: Secondary | ICD-10-CM | POA: Diagnosis not present

## 2019-09-18 DIAGNOSIS — E785 Hyperlipidemia, unspecified: Secondary | ICD-10-CM

## 2019-09-18 DIAGNOSIS — I10 Essential (primary) hypertension: Secondary | ICD-10-CM

## 2019-09-18 MED ORDER — LOSARTAN POTASSIUM 100 MG PO TABS: 100.0000 mg | ORAL_TABLET | Freq: Every evening | ORAL | 3 refills | Status: AC

## 2019-09-18 MED FILL — LOSARTAN POTASSIUM 100 MG T: 100 | 90 days supply | Qty: 90 | Fill #0

## 2019-09-18 NOTE — Telephone Encounter (Signed)
Spoke with Mrs. Regarding appointment scheduled 10/13/19 at 11:10 am at [facility] [address]  Arrival time is 10:50 am--liquids only 4 hours prior to appointment.  Patient to come in next week for lab work.

## 2019-09-18 NOTE — Patient Instructions (Signed)
Medication Instructions:  INCREASE LOSARTAN TO 100 MG ONCE DAILY= 2 OF THE 50 MG TABLETS ONCE DAILY  *If you need a refill on your cardiac medications before your next appointment, please call your pharmacy*  Lab Work: Your physician recommends that you return for lab work in: ONE WEEK  If you have labs (blood work) drawn today and your tests are completely normal, you will receive your results only by: Marland Kitchen MyChart Message (if you have MyChart) OR . A paper copy in the mail If you have any lab test that is abnormal or we need to change your treatment, we will call you to review the results.  Testing/Procedures: CTA OF THE ABDOMEN TO FOLLOW UP ON [address]  Follow-Up: At [facility], you and your health needs are our priority.  As part of our continuing mission to provide you with exceptional heart care, we have created designated Provider Care Teams.  These Care Teams include your primary Cardiologist (physician) and Advanced Practice Providers (APPs -  Physician Assistants and Nurse Practitioners) who all work together to provide you with the care you need, when you need it.  Your next appointment:   6 month(s)  The format for your next appointment:   Either In Person or Virtual  Provider:   You may see Olga Millers, MD or one of the following Advanced Practice Providers on your designated Care Team:    Corine Shelter, PA-C  [place], [place]  Edd Fabian, [place]

## 2019-09-20 ENCOUNTER — Other Ambulatory Visit: Payer: Self-pay | Admitting: Adult Health

## 2019-09-25 MED FILL — NITROGLYCERIN 0.4 MG TAB SL: 0.4 | 10 days supply | Qty: 25 | Fill #0

## 2019-09-27 DIAGNOSIS — I10 Essential (primary) hypertension: Secondary | ICD-10-CM | POA: Diagnosis not present

## 2019-09-28 LAB — BASIC METABOLIC PANEL
BUN/Creatinine Ratio: 17 (ref 10–24)
BUN: 19 mg/dL (ref 8–27)
CO2: 25 mmol/L (ref 20–29)
Calcium: 9.3 mg/dL (ref 8.6–10.2)
Chloride: 103 mmol/L (ref 96–106)
Creatinine, Ser: 1.14 mg/dL (ref 0.76–1.27)
GFR calc Af Amer: 77 mL/min/{1.73_m2} (ref >59)
GFR calc non Af Amer: 66 mL/min/{1.73_m2} (ref >59)
Glucose: 170 mg/dL — ABNORMAL HIGH (ref 65–99)
Potassium: 4.6 mmol/L (ref 3.5–5.2)
Sodium: 139 mmol/L (ref 134–144)

## 2019-10-13 ENCOUNTER — Other Ambulatory Visit: Payer: Self-pay

## 2019-10-13 ENCOUNTER — Ambulatory Visit
Admission: RE | Admit: 2019-10-13 | Discharge: 2019-10-13 | Disposition: A | Discharge status: Home / Self Care | Payer: 59 | Source: Ambulatory Visit | Attending: Cardiology | Admitting: Cardiology

## 2019-10-13 DIAGNOSIS — I714 Abdominal aortic aneurysm, without rupture, unspecified: Secondary | ICD-10-CM

## 2019-10-13 MED ORDER — IOPAMIDOL (ISOVUE-370) INJECTION 76%
75.0000 mL | Freq: Once | INTRAVENOUS | Status: AC | PRN
  Administered 2019-10-13: 11:00:00 75 mL via INTRAVENOUS

## 2019-10-16 ENCOUNTER — Telehealth: Payer: Self-pay | Admitting: Cardiology

## 2019-10-16 MED FILL — NEO/POLY/DEXAMET EYE OINT: 3.5-10000-0 | 3 days supply | Qty: 4 | Fill #0

## 2019-10-16 NOTE — Telephone Encounter (Signed)
Hi Dr. [name],  Mr. Warshaw has bilateral upper eyelid blepharoplasty and direct brow lift scheduled for 10/20/2019. Patient has a history of CAD s/p multiple PCIs and CABG in 06/2006. He had PCI of the ostium of the vein graft supplying OM1 in 01/2018. Last cardiac cath in 05/2018 showed occluded right coronary artery, 40% left main, occluded first marginal, occluded third marginal,45% LAD, 99% ostial first diagonal, patent LIMA to the LAD, patent sequential saphenous vein graft to the ramus and obtuse marginal with previously noted occluded sequential limb, occluded vein graft to the PDA. Medicaltherapy was recommended.   You recently saw the patient on 09/18/2019 at which time he did report some chest tightness with more vigorous activities as well as mild dyspnea on exertion. You offered repeat nuclear stress test but patient declined. This would be a low risk procedure but I just wanted to make sure that you felt patient is OK to proceed with it at this time. Also, can you please comment on how long patient can hold Plavix and Aspirin?  Please route response back to P CV DIV PREOP.  Thank you! Lyle Niblett

## 2019-10-16 NOTE — Telephone Encounter (Signed)
   Primary Cardiologist: Olga Millers, MD  Chart reviewed as part of pre-operative protocol coverage. Patient was recently seen by Dr. [name] on 09/18/2019 at which time he reported some chest tightness with vigorous activity and some mild dyspnea with exertion. Nuclear stress test was offered at that time but patient declined. Discussed with Dr. [name] who feels patient is OK to proceed with this low risk procedure. No further cardiovascular testing needed at this time. OK to hold Plavix prior to procedure but Aspirin should be continued. Plavix should also be restarted as soon as able following procedure.   I will route this recommendation to the requesting party via Epic fax function and remove from pre-op pool.  Please call with questions.  Corrin Parker, PA-C 10/16/2019, 2:54 PM

## 2019-10-16 NOTE — Telephone Encounter (Signed)
Okay for surgery.  Can hold Plavix prior to procedure but would continue aspirin 81 mg daily. [name]

## 2019-10-16 NOTE — Telephone Encounter (Signed)
   [facility] Pre-operative Risk Assessment    Request for surgical clearance:  1. What type of surgery is being performed? Bilateral upper eyelid blepharoplasty & direct brow lift   2. When is this surgery scheduled? 10/20/19  3. What type of clearance is required (medical clearance vs. Pharmacy clearance to hold med vs. Both)? Both   4. Are there any medications that need to be held prior to surgery and how long? Plaxis and Aspirin    5. Practice name and name of physician performing surgery? Dr. Isidoro Donning,  [facility]   6. What is your office phone number [phone]   7.   What is your office fax number [phone]  8.   Anesthesia type (None, local, MAC, general) ? [place] 10/16/2019, 2:12 PM  _________________________________________________________________   (provider comments below)

## 2019-10-18 ENCOUNTER — Other Ambulatory Visit: Payer: Self-pay | Admitting: Cardiology

## 2019-10-18 ENCOUNTER — Other Ambulatory Visit: Payer: Self-pay | Admitting: Cardiovascular Disease

## 2019-10-18 DIAGNOSIS — K219 Gastro-esophageal reflux disease without esophagitis: Secondary | ICD-10-CM

## 2019-10-18 MED FILL — METOPROLOL SUCCINATE ER 25: 25 | 90 days supply | Qty: 90 | Fill #0

## 2019-10-18 MED FILL — ROSUVASTATIN CALCIUM 5 MG T: 5 | 56 days supply | Qty: 16 | Fill #0

## 2019-10-18 MED FILL — NITROGLYCERIN 0.4 MG TAB SL: 0.4 | 10 days supply | Qty: 25 | Fill #0

## 2019-11-17 ENCOUNTER — Other Ambulatory Visit: Payer: Self-pay | Admitting: Adult Health

## 2019-11-17 MED FILL — AMLODIPINE BESYLATE 10 MG T: 10 | 90 days supply | Qty: 90 | Fill #0

## 2019-12-08 MED FILL — ROSUVASTATIN CALCIUM 5 MG T: 5 | 56 days supply | Qty: 16 | Fill #1

## 2019-12-08 MED FILL — CLOPIDOGREL 75 MG TABLET: 75 | 90 days supply | Qty: 90 | Fill #1

## 2019-12-26 ENCOUNTER — Other Ambulatory Visit: Payer: Self-pay | Admitting: Cardiology

## 2019-12-28 ENCOUNTER — Other Ambulatory Visit: Payer: Self-pay | Admitting: Cardiology

## 2019-12-28 MED FILL — ISOSORBIDE MN ER 30 MG TAB: 30 | 90 days supply | Qty: 90 | Fill #0

## 2020-01-25 MED FILL — METOPROLOL SUCCINATE ER 25: 25 | 90 days supply | Qty: 90 | Fill #1

## 2020-02-10 MED FILL — LOSARTAN POTASSIUM 100 MG T: 100 | 90 days supply | Qty: 90 | Fill #1

## 2020-02-10 MED FILL — ROSUVASTATIN CALCIUM 5 MG T: 5 | 56 days supply | Qty: 16 | Fill #2

## 2020-02-27 MED FILL — AMLODIPINE BESYLATE 10 MG T: 10 | 90 days supply | Qty: 90 | Fill #1

## 2020-03-16 NOTE — Progress Notes (Signed)
Virtual Visit via Telephone Note   This visit type was conducted due to national recommendations for restrictions regarding the COVID-19 Pandemic (e.g. social distancing) in an effort to limit this patient's exposure and mitigate transmission in our community.  Due to his co-morbid illnesses, this patient is at least at moderate risk for complications without adequate follow up.  This format is felt to be most appropriate for this patient at this time.  The patient did not have access to video technology/had technical difficulties with video requiring transitioning to audio format only (telephone).  All issues noted in this document were discussed and addressed.  No physical exam could be performed with this format.  Please refer to the patient's chart for his  consent to telehealth for [facility].   Date:  03/18/2020   ID:  [name], DOB Jan 25, 1951, MRN [id number]  Patient Location: Home Provider Location: Office/Clinic  PCP:  Janeece Agee, NP  Cardiologist:  Olga Millers, MD  Electrophysiologist:  None   Evaluation Performed:  Follow-Up Visit  Chief Complaint:  Follow Up  History of Present Illness:    [name] is a 69 y.o. male we are following for ongoing assessment and management of coronary artery disease, history of multiple prior PCI's, CABG in October 2007 (LIMA to LAD, SVG to intermediate and OM, and a saphenous vein graft to PDA).  He had PCI of the ostium of the vein graft supplying the OM1 in May 2019.  Other history includes hyperlipidemia, with goal of LDL less than 70, hypertension, peripheral vascular disease, infrarenal abdominal aortic aneurysm, and GERD.  Most recent cardiac catheterization September 2019 revealed an occluded right coronary artery, 40% left main, occluded first marginal, occluded third marginal, 45% LAD, 99% ostial first diagonal, patent LIMA to LAD, patent sequential saphenous vein graft to the ramus and OM with previously noted  occluded sequential limb with occluded vein graft to the PDA.  Medical therapy was recommended.  Abdominal CTA in December 2019 showed a 3.7 cm infrarenal abdominal aortic aneurysm.  Was last seen by Dr. [name] on 09/18/2019 at that time a repeat CTA was ordered.  This was completed on 10/13/2019.  This revealed trilobed infrarenal atomic aortic aneurysm dominant caudal component measuring 3.5 cm in diameter unchanged compared to 08/26/2018 examination though increased in size compared to 06/2016 examination. He was also suspected to have a 50% luminal narrowing of the left common iliac artery similar to 06/2016 examination.  He also has suspected hepatic stenosis.  He was last seen by Dr. [name] on 09/18/2019 at which time a repeat abdominal CTA was ordered as discussed above.  He was continued on medical management.  Use of PCSK9 inhibition was discussed with the patient but he declined preferring to try diet and exercise.  He did complain of mild discomfort in his chest with vigorous activities.  This was documented is longstanding.  Dr. [name] did suggest repeating his nuclear cardiac study but the patient declined.  The patient have symptoms concerning for COVID-19 infection (fever, chills, cough, or new shortness of breath). He has declined COVID vaccine.   He reports that he is doing very well.  Denies any significant chest pain, dyspnea on exertion, has been taking co-Q10 which is help with muscle cramps.  He is now working from home and has become very sedentary.  He did have to take 1 nitroglycerin while lying on the couch after having a minor chest discomfort sensation.  He took 1 nitroglycerin which  helped after about 10 minutes.  He is not certain if this is associated with his heart or not.  Blood pressures been well controlled but he is uncertain if his blood pressure machine is working correctly.  He is going to find a new one.  He also is wondering if he can take Crestor more often as he  is no longer having myalgia pain on co-Q10.  On review last labs his cholesterol was not well controlled.   Past Medical History:  Diagnosis Date  . Arthritis   . Asthma    occ bronchitis  . Bronchitis   . COPD (chronic obstructive pulmonary disease) (HCC)   . Coronary artery disease    a. s/p multiple prior PCIs; b. s/p CABG 06/2006 (L-LAD, S-Int and OM, S-PDA);  c. ETT-Myoview 4/13: low risk, no ischemia, EF 53%  . Dizziness   . GERD (gastroesophageal reflux disease)   . Hx of echocardiogram    Echo 10/2006: normal LVF  . Hyperlipidemia   . Hypertension   . Myocardial infarction (HCC)   . Peripheral vascular disease (HCC)   . Seasonal allergies   . Wears dentures   . Wears glasses    Past Surgical History:  Procedure Laterality Date  . ABDOMINAL AORTOGRAM N/A 04/28/2017   Procedure: ABDOMINAL AORTOGRAM;  Surgeon: Iran Ouch, MD;  Location: [facility];  Service: Cardiovascular;  Laterality: N/A;  . ACNE CYST REMOVAL    . CARDIAC CATHETERIZATION    . COLONOSCOPY    . CORONARY ARTERY BYPASS GRAFT  2007  . CORONARY STENT INTERVENTION  01/05/2018  . CORONARY STENT INTERVENTION N/A 01/05/2018   Procedure: CORONARY STENT INTERVENTION;  Surgeon: Lennette Bihari, MD;  Location: [facility];  Service: Cardiovascular;  Laterality: N/A;  . CORONARY STENT PLACEMENT     x5 prior to cabg  . FEMORAL-POPLITEAL BYPASS GRAFT Right 06/28/2017   Procedure: BYPASS GRAFT COMMON FEMORAL- ABOVE KNEE POPLITEAL ARTERY WITH LEFT GREATER SAPHENOUS VEIN;  Surgeon: Fransisco Hertz, MD;  Location: [facility] OR;  Service: Vascular;  Laterality: Right;  . KNEE ARTHROSCOPY     rt knee  . LEFT HEART CATH AND CORS/GRAFTS ANGIOGRAPHY N/A 01/05/2018   Procedure: LEFT HEART CATH AND CORS/GRAFTS ANGIOGRAPHY;  Surgeon: Lennette Bihari, MD;  Location: [facility];  Service: Cardiovascular;  Laterality: N/A;  . LEFT HEART CATH AND CORS/GRAFTS ANGIOGRAPHY N/A 05/27/2018   Procedure: LEFT HEART CATH AND  CORS/GRAFTS ANGIOGRAPHY;  Surgeon: Lennette Bihari, MD;  Location: [facility];  Service: Cardiovascular;  Laterality: N/A;  . LOWER EXTREMITY ANGIOGRAPHY Bilateral 04/28/2017   Procedure: Lower Extremity Angiography;  Surgeon: Iran Ouch, MD;  Location: [facility];  Service: Cardiovascular;  Laterality: Bilateral;  . MULTIPLE TOOTH EXTRACTIONS    . SHOULDER ARTHROSCOPY  12/14/2011   removed bone spurs and repositioned muscle.   . TONSILLECTOMY       Current Meds  Medication Sig  . albuterol (PROVENTIL HFA;VENTOLIN HFA) 108 (90 Base) MCG/ACT inhaler Inhale 2 puffs into the lungs every 4 (four) hours as needed for wheezing or shortness of breath (cough, shortness of breath or wheezing.).  Marland Kitchen amLODipine (NORVASC) 10 MG tablet TAKE 1 TABLET BY MOUTH DAILY.  Marland Kitchen aspirin EC 81 MG tablet Take 81 mg by mouth daily.  . cetirizine (ZYRTEC) 10 MG tablet Take 10 mg by mouth daily.   . clopidogrel (PLAVIX) 75 MG tablet Take 75 mg by mouth daily.  Marland Kitchen  diphenhydramine-acetaminophen (TYLENOL PM) 25-500 MG TABS tablet Take 2 tablets by mouth at bedtime.  . isosorbide mononitrate (IMDUR) 30 MG 24 hr tablet TAKE 1 TABLET BY MOUTH DAILY.  Marland Kitchen losartan (COZAAR) 100 MG tablet Take 1 tablet (100 mg total) by mouth every evening.  . meclizine (ANTIVERT) 25 MG tablet Take 1 tablet (25 mg total) by mouth 2 (two) times daily as needed for dizziness.  . metoprolol succinate (TOPROL-XL) 25 MG 24 hr tablet Take 1 tablet (25 mg total) by mouth daily.  . nitroGLYCERIN (NITROSTAT) 0.4 MG SL tablet Place 1 tablet (0.4 mg total) under the tongue every 5 (five) minutes as needed for chest pain.  Marland Kitchen omeprazole (PRILOSEC) 20 MG capsule TAKE 1 CAPSULE BY MOUTH DAILY.  . rosuvastatin (CRESTOR) 5 MG tablet TAKE 1 TABLET BY MOUTH EVERY MONDAY AND FRIDAY     Allergies:   Lipitor [atorvastatin calcium], Neosporin [neomycin-bacitracin zn-polymyx], and Codeine   Social History   Tobacco Use  . Smoking status: Former  Smoker    Packs/day: 1.00    Types: Cigarettes    Quit date: 03/22/2016    Years since quitting: 3.9  . Smokeless tobacco: Never Used  Vaping Use  . Vaping Use: Never used  Substance Use Topics  . Alcohol use: Yes    Alcohol/week: 0.0 standard drinks    Comment: occasional - BEER OR WINE  . Drug use: No     Family Hx: The patient's family history includes Coronary artery disease in his father; Diabetes in his mother; Diabetes type II in his mother; Heart disease in his brother, father, sister, and sister. There is no history of Anesthesia problems.  ROS:   Please see the history of present illness.    All other systems reviewed and are negative.   Prior CV studies:   The following studies were reviewed today:  Prox RCA to Mid RCA lesion is 100% stenosed.  Prox RCA lesion is 100% stenosed.  Ost 1st Mrg lesion is 100% stenosed.  Origin lesion is 100% stenosed.  Ost 3rd Mrg lesion is 100% stenosed.  Prox Graft to Insertion lesion between 1st Mrg and 3rd Mrg is 100% stenosed.  Ost LM to Mid LM lesion is 45% stenosed.  Ost LAD to Prox LAD lesion is 45% stenosed.  Ost 1st Diag to 1st Diag lesion is 99% stenosed.   Significant native multivessel CAD with 40% focal mid left main stenosis, 50% intimal hyperplasia in the proximal LAD stent, subtotal first diagonal branch of the LAD and competitive filling of the mid LAD due to the LIMA graft.  Occluded ramus/high OM and mid OM vessel at its origin from the AV groove circumflex hemicolon and previously documented total occlusion of the proximal RCA prior to the previously placed stent with mild antegrade collaterals to the mid RCA but extensive retrograde collaterals supplying the PDA and PLA vessels from the left circumflex as well as the distal LAD.  Patent LIMA graft to the LAD.  Patent ostial stent in the sequential vein graft to the ramus and OM vessel with mild 10 to 15% intimal hyperplasia, and previously noted occluded  sequential limb.  Occluded vein graft to PDA.  Bilateral ABI's 06/14/2020 Right: Resting right ankle-brachial index is within normal range. No  evidence of significant right lower extremity arterial disease. The right  toe-brachial index is normal. RT great toe pressure = 105 mmHg.   Left: Resting left ankle-brachial index is within normal range. No  evidence of significant left  lower extremity arterial disease. The left  toe-brachial index is normal. LT Great toe pressure = 119 mmHg.    Labs/Other Tests and Data Reviewed:    EKG:  No ECG reviewed.  Recent Labs: 04/07/2019: ALT 38; Hemoglobin 13.3; Platelets 216 09/27/2019: BUN 19; Creatinine, Ser 1.14; Potassium 4.6; Sodium 139   Recent Lipid Panel Lab Results  Component Value Date/Time   CHOL 210 (H) 04/07/2019 01:54 PM   TRIG 421 (H) 04/07/2019 01:54 PM   HDL 34 (L) 04/07/2019 01:54 PM   CHOLHDL 6.2 (H) 04/07/2019 01:54 PM   CHOLHDL 5.4 06/29/2017 01:39 AM   LDLCALC Comment 04/07/2019 01:54 PM   LDLDIRECT 91.0 06/16/2013 09:06 AM    Wt Readings from Last 3 Encounters:  03/18/20 235 lb (106.6 kg)  09/18/19 242 lb 6.4 oz (110 kg)  08/22/19 243 lb (110.2 kg)     Objective:    Vital Signs:  BP 123/72   Pulse 63   Ht 5\' 10"  (1.778 m)   Wt 235 lb (106.6 kg)   BMI 33.72 kg/m  Limited due to telephone visit  VITAL SIGNS:  reviewed GEN:  no acute distress RESPIRATORY:  normal respiratory effort, symmetric expansion NEURO:  alert and oriented x 3, no obvious focal deficit PSYCH:  normal affect  ASSESSMENT & PLAN:    1.  Coronary artery disease: History of multiple prior PCI's and CABG (2007), with most recent intervention with PCI of the ostium of the vein graft supplying the OM1 in 01/2018.  He remains on dual antiplatelet therapy with aspirin and Plavix.  He has had rare "minor" pain in which she had to take 1 nitroglycerin.  Pain completely subsided.  He will continue with secondary risk modification.  Continue  beta-blocker and statin therapy.  2.  Hyperlipidemia: Not well controlled on last review of his cholesterol status.  He apparently has not been tolerating multiple changes in statin therapy.  He has been tolerating recent dosing of rosuvastatin 5 mg on Mondays and Fridays.  Since he is not having any further muscle cramping as he is taking coq.10, he asked whether he could try going up on the amount of days he takes the rosuvastatin.  I advised him that that would be a good trial.  We can start him on every day but if not he can back off to Monday Wednesdays and Fridays.  He will let 01-25-1989 know how he tolerates the increased days of dosing on the statin therapy.  Also, I am going to start him on Zetia 10 mg daily.  He will have fasting lipids and LFTs in 6 weeks.  He will plan to come by the office to have these drawn.  He is very reluctant to have PCSK9 inhibition therapy as he is afraid of injections/needles.  3.  GERD: Remains on PPI.  Will likely need to have a BM ET and magnesium if muscle cramping begins again.  4.  Hypertension: He remains on ARB, beta-blocker, isosorbide mononitrate, amlodipine 10 mg, with good blood pressure control.  He would like to get a new blood pressure machine is he is not certain that his blood pressure is correct.  However it does coincide with office visit with his PCP.  COVID-19 Education: The signs and symptoms of COVID-19 were discussed with the patient and how to seek care for testing (follow up with PCP or arrange E-visit).  The importance of social distancing was discussed today.  Also stressed the importance of having his Covid  vaccine with heart disease and COPD.  He verbalizes understanding and is considering it.  Time:   Today, I have spent 20 minutes with the patient with telehealth technology discussing the above problems.     Medication Adjustments/Labs and Tests Ordered: Current medicines are reviewed at length with the patient today.  Concerns regarding  medicines are outlined above.   Tests Ordered: No orders of the defined types were placed in this encounter.   Medication Changes: No orders of the defined types were placed in this encounter.   Disposition:  Follow up 6 months in person with Dr. [name]   Signed, Bettey Mare. Liborio Nixon, ANP, AACC  03/18/2020 10:04 AM    [facility]

## 2020-03-18 ENCOUNTER — Other Ambulatory Visit: Payer: Self-pay | Admitting: Adult Health

## 2020-03-18 ENCOUNTER — Encounter: Payer: Self-pay | Admitting: Adult Health

## 2020-03-18 ENCOUNTER — Telehealth (INDEPENDENT_AMBULATORY_CARE_PROVIDER_SITE_OTHER): Payer: 59 | Admitting: Adult Health

## 2020-03-18 VITALS — BP 123/72 | HR 63 | Ht 70.0 in | Wt 235.0 lb

## 2020-03-18 DIAGNOSIS — I2510 Atherosclerotic heart disease of native coronary artery without angina pectoris: Secondary | ICD-10-CM | POA: Diagnosis not present

## 2020-03-18 DIAGNOSIS — I10 Essential (primary) hypertension: Secondary | ICD-10-CM

## 2020-03-18 DIAGNOSIS — Z95828 Presence of other vascular implants and grafts: Secondary | ICD-10-CM

## 2020-03-18 DIAGNOSIS — Z79899 Other long term (current) drug therapy: Secondary | ICD-10-CM | POA: Diagnosis not present

## 2020-03-18 DIAGNOSIS — E785 Hyperlipidemia, unspecified: Secondary | ICD-10-CM

## 2020-03-18 DIAGNOSIS — I714 Abdominal aortic aneurysm, without rupture, unspecified: Secondary | ICD-10-CM

## 2020-03-18 DIAGNOSIS — I43 Cardiomyopathy in diseases classified elsewhere: Secondary | ICD-10-CM

## 2020-03-18 MED ORDER — CLOPIDOGREL BISULFATE 75 MG PO TABS: 75.0000 mg | ORAL_TABLET | Freq: Every day | ORAL | 3 refills | Status: DC

## 2020-03-18 MED ORDER — EZETIMIBE 10 MG PO TABS: 10.0000 mg | ORAL_TABLET | Freq: Every day | ORAL | 3 refills | Status: DC

## 2020-03-18 MED ORDER — ROSUVASTATIN CALCIUM 5 MG PO TABS: 5.0000 mg | ORAL_TABLET | Freq: Every day | ORAL | 2 refills | Status: DC

## 2020-03-18 MED FILL — EZETIMIBE 10 MG TABS: 10 | 90 days supply | Qty: 90 | Fill #0

## 2020-03-18 MED FILL — CLOPIDOGREL 75 MG TABLET: 75 | 90 days supply | Qty: 90 | Fill #0

## 2020-03-18 NOTE — Patient Instructions (Signed)
Medication Instructions:  START- Zetia 10 mg by mouth daily  *If you need a refill on your cardiac medications before your next appointment, please call your pharmacy*   Lab Work: Fasting Lipid and Liver in 6 weeks  If you have labs (blood work) drawn today and your tests are completely normal, you will receive your results only by: Marland Kitchen MyChart Message (if you have MyChart) OR . A paper copy in the mail If you have any lab test that is abnormal or we need to change your treatment, we will call you to review the results.   Testing/Procedures: None Ordered   Follow-Up: At [facility], you and your health needs are our priority.  As part of our continuing mission to provide you with exceptional heart care, we have created designated Provider Care Teams.  These Care Teams include your primary Cardiologist (physician) and Advanced Practice Providers (APPs -  Physician Assistants and Nurse Practitioners) who all work together to provide you with the care you need, when you need it.  We recommend signing up for the patient portal called "MyChart".  Sign up information is provided on this After Visit Summary.  MyChart is used to connect with patients for Virtual Visits (Telemedicine).  Patients are able to view lab/test results, encounter notes, upcoming appointments, etc.  Non-urgent messages can be sent to your provider as well.   To learn more about what you can do with MyChart, go to [email or web address].    Your next appointment:   6 month(s)  The format for your next appointment:   In Person  Provider:   You may see Olga Millers, MD or one of the following Advanced Practice Providers on your designated Care Team:    Corine Shelter, PA-C  [place], [place]  Edd Fabian, [place]

## 2020-03-21 ENCOUNTER — Telehealth: Payer: Self-pay | Admitting: Registered Nurse

## 2020-03-21 ENCOUNTER — Other Ambulatory Visit: Payer: Self-pay | Admitting: Cardiology

## 2020-03-21 ENCOUNTER — Telehealth: Payer: Self-pay | Admitting: Cardiology

## 2020-03-21 DIAGNOSIS — K219 Gastro-esophageal reflux disease without esophagitis: Secondary | ICD-10-CM

## 2020-03-21 MED FILL — NITROGLYCERIN 0.4 MG TAB SL: 0.4 | 10 days supply | Qty: 25 | Fill #1

## 2020-03-21 NOTE — Telephone Encounter (Signed)
Is this ok to change this medication

## 2020-03-21 NOTE — Telephone Encounter (Signed)
New Message  Clerance Lav from [facility] is calling in to speak with Renea Ee. Transferred call.

## 2020-03-21 NOTE — Telephone Encounter (Signed)
Error

## 2020-03-21 NOTE — Telephone Encounter (Signed)
[facility] calling has a refilll for   omeprazole (PRILOSEC) 20 MG capsule [[id number]  wants ok  to change to  Protonics  Omeprazole at 40 milligrams because there is drug  Interaction. This order came to their  pharmacy electronically from our office / but providers name is wrong   Please advise

## 2020-03-22 ENCOUNTER — Other Ambulatory Visit: Payer: Self-pay | Admitting: Emergency Medicine

## 2020-03-22 ENCOUNTER — Other Ambulatory Visit: Payer: Self-pay

## 2020-03-22 DIAGNOSIS — K219 Gastro-esophageal reflux disease without esophagitis: Secondary | ICD-10-CM

## 2020-03-22 MED ORDER — PANTOPRAZOLE SODIUM 40 MG PO TBEC: 40.0000 mg | DELAYED_RELEASE_TABLET | Freq: Every day | ORAL | 3 refills | Status: DC

## 2020-03-22 MED ORDER — OMEPRAZOLE 20 MG PO CPDR: 40.0000 mg | DELAYED_RELEASE_CAPSULE | Freq: Every day | ORAL | 1 refills | Status: DC

## 2020-03-22 MED FILL — PANTOPRAZOLE SOD DR 40 MG T: 40 | 90 days supply | Qty: 90 | Fill #0

## 2020-03-22 NOTE — Telephone Encounter (Signed)
Yes - definitely fine  Thank you  Jari Sportsman, NP

## 2020-03-22 NOTE — Telephone Encounter (Signed)
Rx has been sent with the increased dose from 20 mg to 40 mg. Two 20 mg pills a day as requested

## 2020-03-22 NOTE — Telephone Encounter (Signed)
Called and spoke with pt's wife per DPR. She reports that the pt spoke with Joni Reining a few days ago to get his omeprazole filled and it was not. It was sent to his pharmacy today but the pharmacist stopped it from being filled until the okay from our office due to the interaction with omeprazole and plavix. Wife would like to know which medication they should be on and if it needs to be changed.  Spoke with Raquel RPH who reports that omeprazole makes plavix less effective and that we prefer to place pts on protonix. She states if the pt wants the omeprazole filled he will need to speak with his pcp otherwise we can start protonix 40mg  daily.  Reviewed Pharmacists advice with wife, who reports that they are fine with switching to protonix 40mg  daily. Wife verbalized understanding and had no other questions at this time. Prescription sent to preferred pharmacy.

## 2020-03-22 NOTE — Telephone Encounter (Signed)
New message   Patient's wife needs a call to discuss   omeprazole (PRILOSEC) 20 MG capsule   Please call to discuss what medication the patient needs to take.

## 2020-03-26 MED FILL — ROSUVASTATIN CALCIUM 5 MG T: 5 | 56 days supply | Qty: 16 | Fill #3

## 2020-03-27 ENCOUNTER — Telehealth: Payer: Self-pay | Admitting: Cardiology

## 2020-03-27 ENCOUNTER — Other Ambulatory Visit: Payer: Self-pay | Admitting: Cardiology

## 2020-03-27 MED ORDER — ROSUVASTATIN CALCIUM 5 MG PO TABS: 5.0000 mg | ORAL_TABLET | Freq: Every day | ORAL | 3 refills | Status: DC

## 2020-03-27 NOTE — Telephone Encounter (Signed)
Per last office note pt was to try and take daily and thus far has tolerated per wife . New script sent in via epic /cy

## 2020-03-27 NOTE — Telephone Encounter (Signed)
Pt c/o medication issue:  1. Name of Medication: rosuvastatin (CRESTOR) 5 MG tablet  2. How are you currently taking this medication (dosage and times per day)? 1 tablet (5 mg total) by mouth daily  3. Are you having a reaction (difficulty breathing--STAT)? No  4. What is your medication issue? Patient's wife, Lanora Manis, states prescription was written incorrectly. She states patient only received 14 tablets, to be taken only twice a week. Lanora Manis is requesting to have prescription updated with correct instructions and sent to [facility] [place], [place] - [address] [facility].

## 2020-04-01 MED FILL — ISOSORBIDE MN ER 30 MG TAB: 30 | 90 days supply | Qty: 90 | Fill #1

## 2020-04-05 MED FILL — ROSUVASTATIN CALCIUM 5 MG T: 5 | 90 days supply | Qty: 90 | Fill #0

## 2020-04-27 MED FILL — LOSARTAN POTASSIUM 100 MG T: 100 | 90 days supply | Qty: 90 | Fill #2

## 2020-04-27 MED FILL — METOPROLOL SUCCINATE ER 25: 25 | 90 days supply | Qty: 90 | Fill #2

## 2020-05-09 MED FILL — AMLODIPINE BESYLATE 10 MG T: 10 | 90 days supply | Qty: 90 | Fill #2

## 2020-06-07 IMAGING — CR DG CHEST 2V
2 series · 2 of 2 positions shown · non-contrast
Comparison: 09/12/2015

CLINICAL DATA: Midsternal chest pain.

EXAM:
CHEST - 2 VIEW

[chest pa]
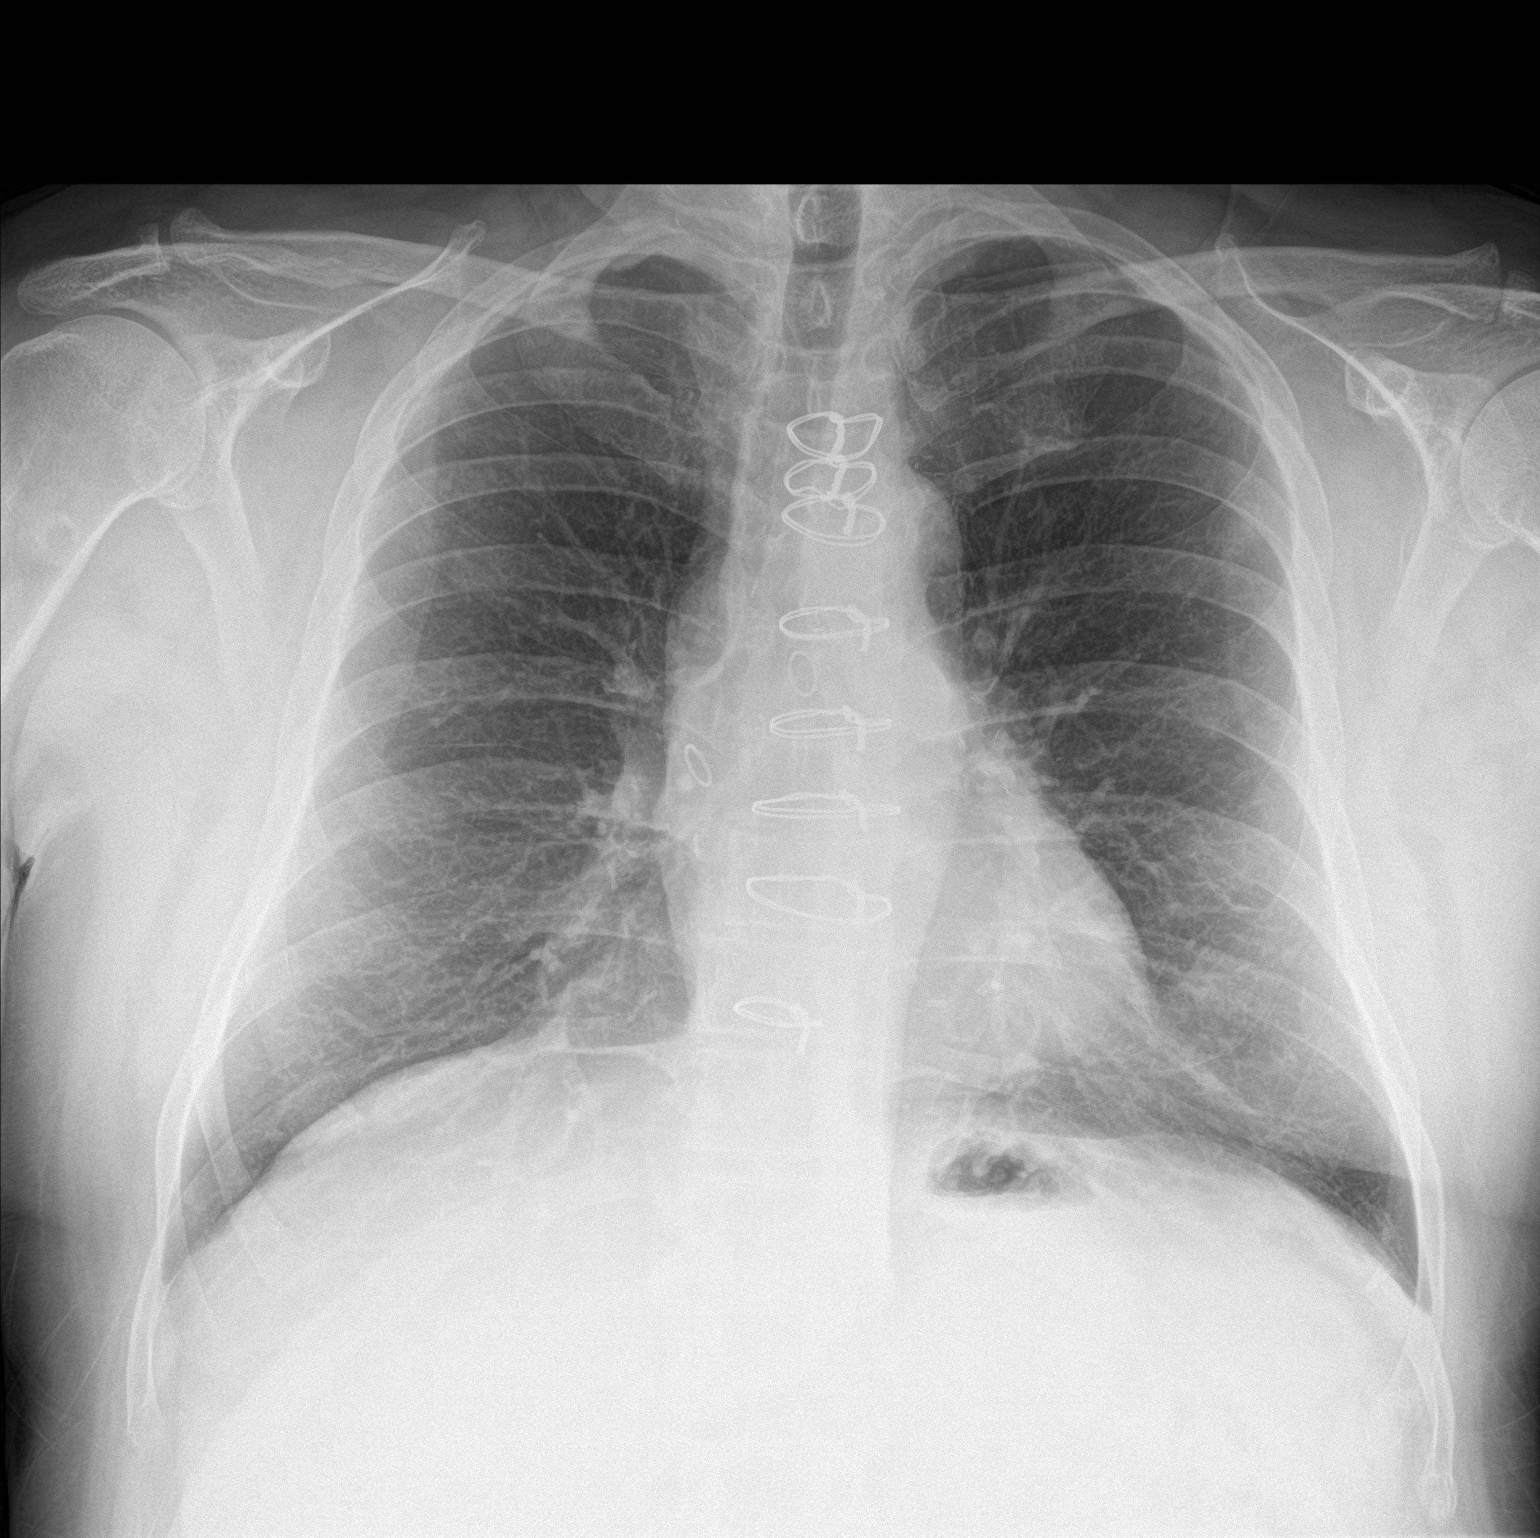

[chest lat]
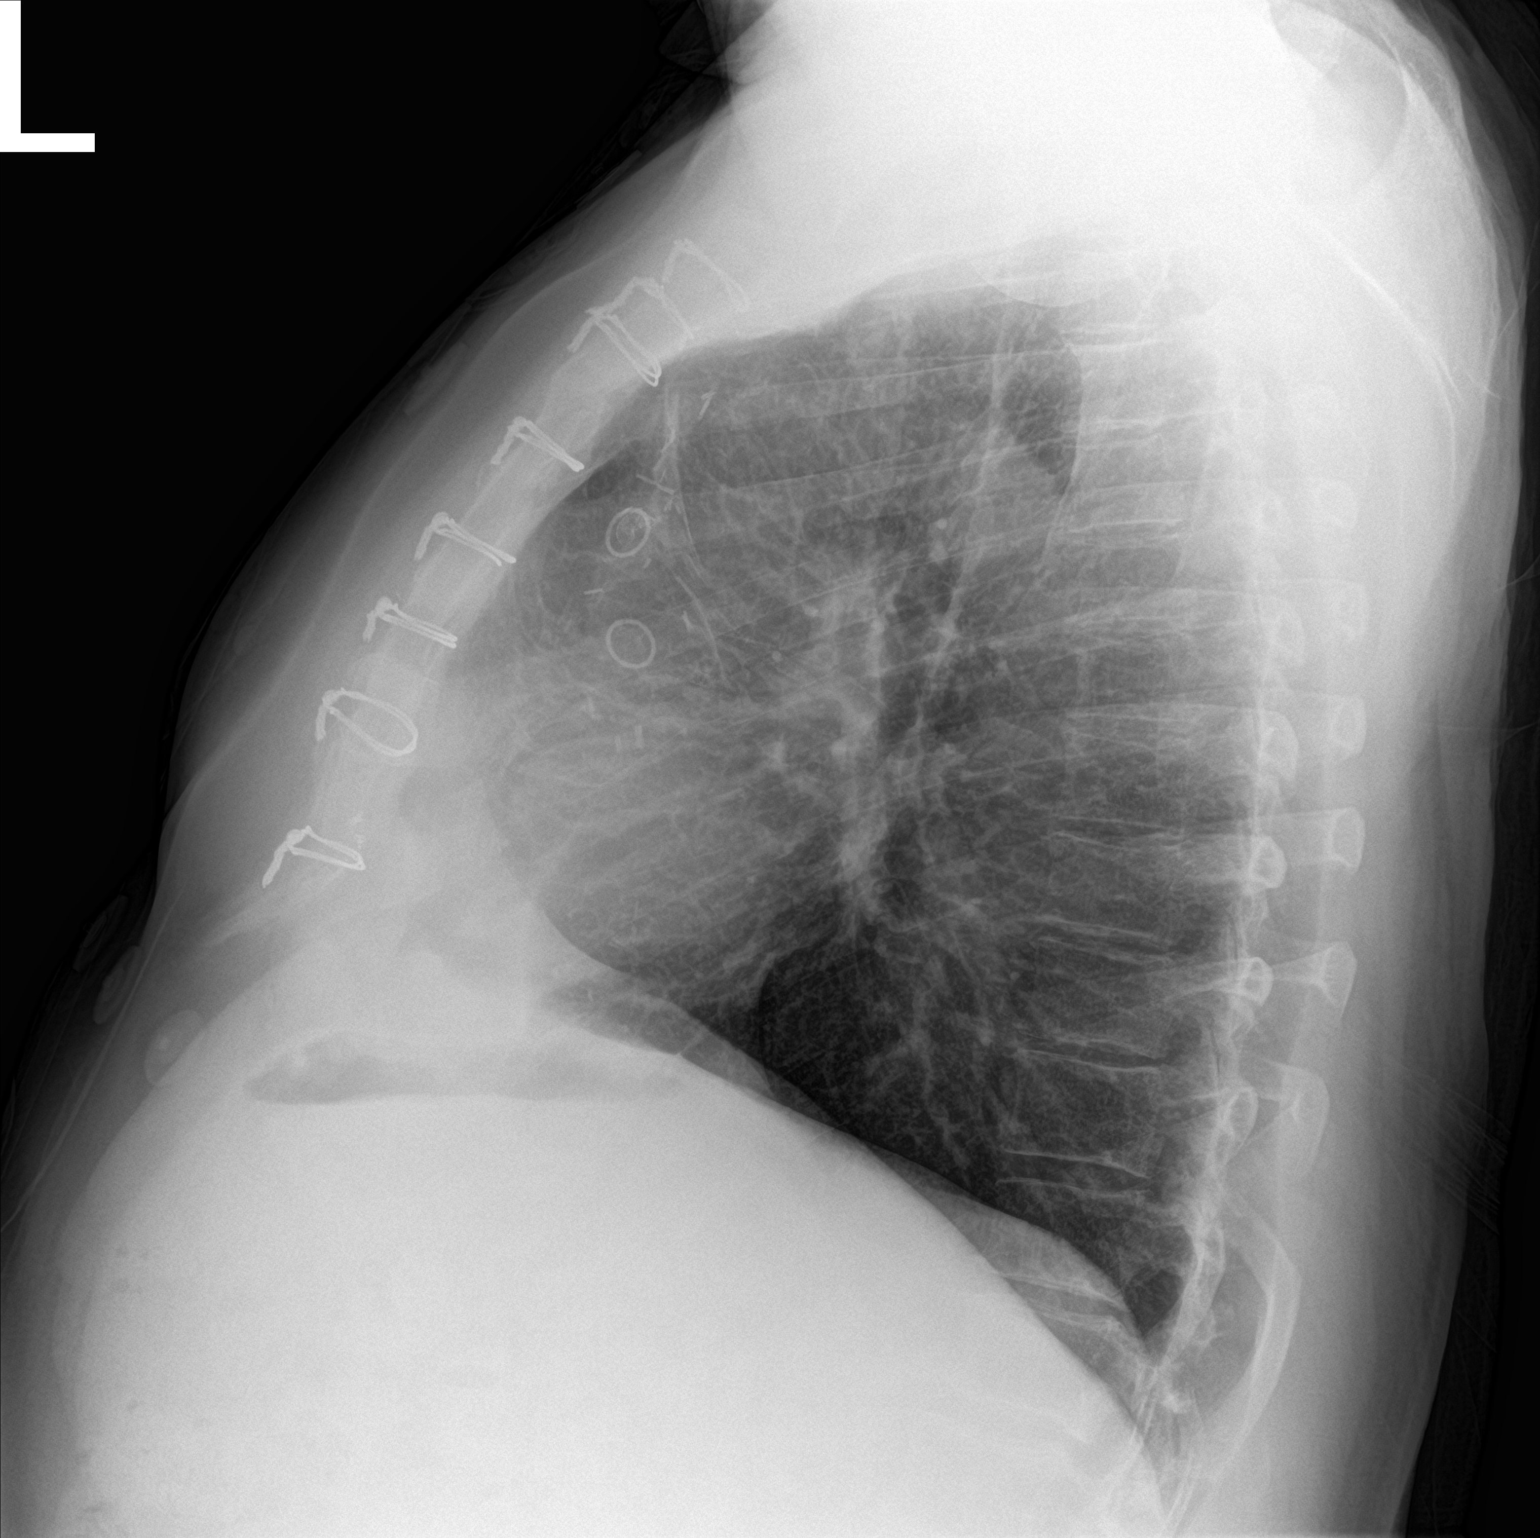

[2 of 2 positions shown; findings below may reference images not displayed]

FINDINGS: Both lungs are clear. Negative for a pneumothorax. Small focus of
sclerosis involving the anterior left third rib appears stable.
Heart size is normal. Prior CABG procedure. Sternal wires appear to
be intact. No large pleural effusions. Stable round lucency
involving the proximal right humerus.
IMPRESSION: No active cardiopulmonary disease.

## 2020-06-17 MED FILL — EZETIMIBE 10 MG TABS: 10 | 90 days supply | Qty: 90 | Fill #1

## 2020-06-17 MED FILL — PANTOPRAZOLE SOD DR 40 MG T: 40 | 90 days supply | Qty: 90 | Fill #1

## 2020-07-01 MED FILL — CLOPIDOGREL 75 MG TABLET: 75 | 90 days supply | Qty: 90 | Fill #1

## 2020-07-15 MED FILL — ISOSORBIDE MN ER 30 MG TAB: 30 | 90 days supply | Qty: 90 | Fill #2

## 2020-07-15 MED FILL — ROSUVASTATIN CALCIUM 5 MG T: 5 | 90 days supply | Qty: 90 | Fill #1

## 2020-08-27 IMAGING — CT CT ANGIO ABDOMEN
2 of 6 series · 13 of 46 positions shown, 15 images · IV contrast (iopamidol)
Comparison: 06/09/2016

CLINICAL DATA: Abdominal aortic aneurysm, follow-up

EXAM:
CT ANGIOGRAPHY ABDOMEN
TECHNIQUE: Multidetector CT imaging of the abdomen was performed using the
standard protocol during bolus administration of intravenous
contrast. Multiplanar reconstructed images and MIPs were obtained
and reviewed to evaluate the vascular anatomy.
CONTRAST:  100mL EIQH9N-Y11 IOPAMIDOL (EIQH9N-Y11) INJECTION 61%

[Series 4: arterial 3.0 i40f 2 · axial · arterial · 0.80mm/px · z∈[-296,-98]mm · 10 of 80 slices shown, 12 images]
[im 7/80  soft-tissue]
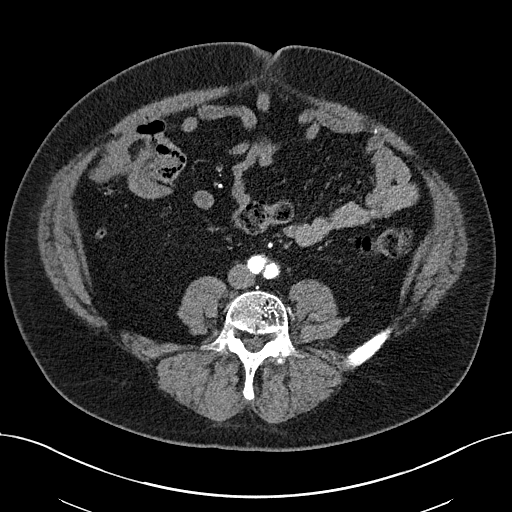
[im 7/80  bone]
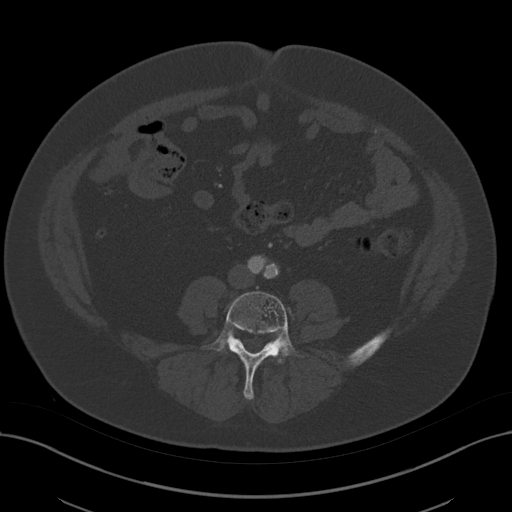
[im 13/80  soft-tissue]
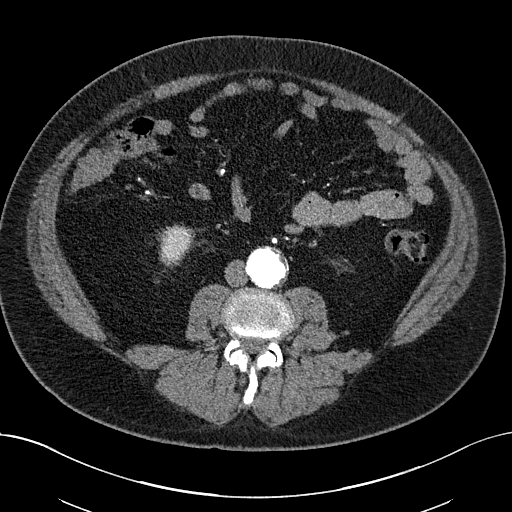
[im 23/80  soft-tissue]
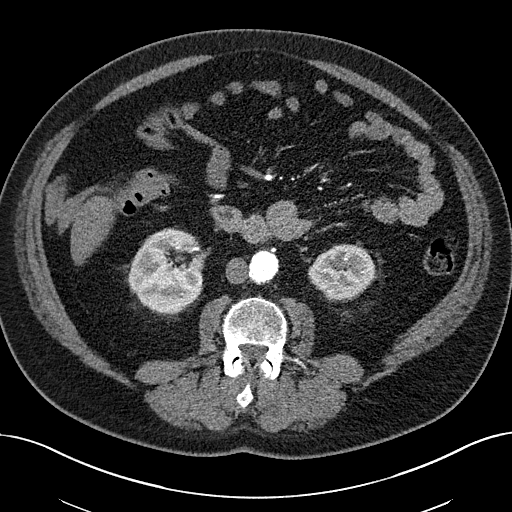
[im 29/80  soft-tissue]
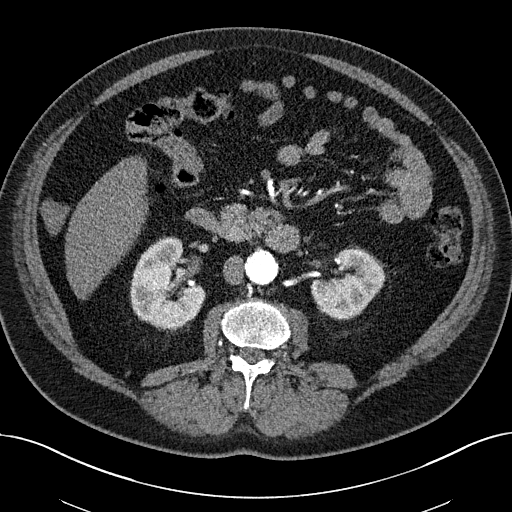
[im 35/80  soft-tissue]
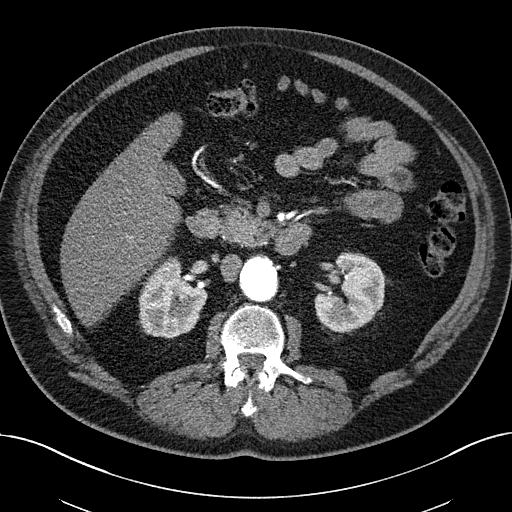
[im 45/80  soft-tissue]
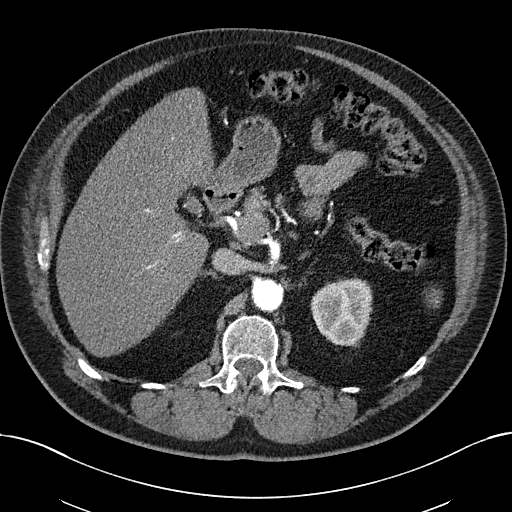
[im 51/80  soft-tissue]
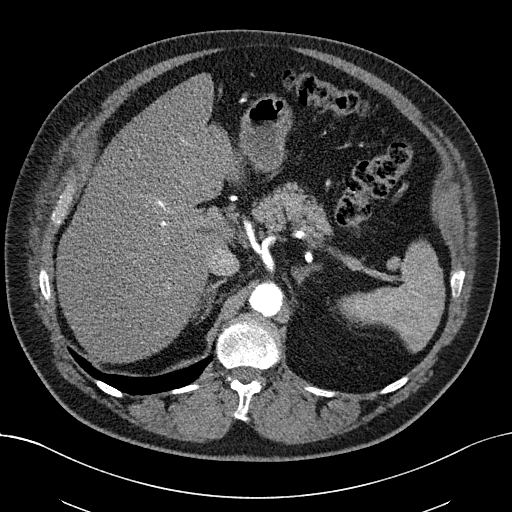
[im 61/80  soft-tissue]
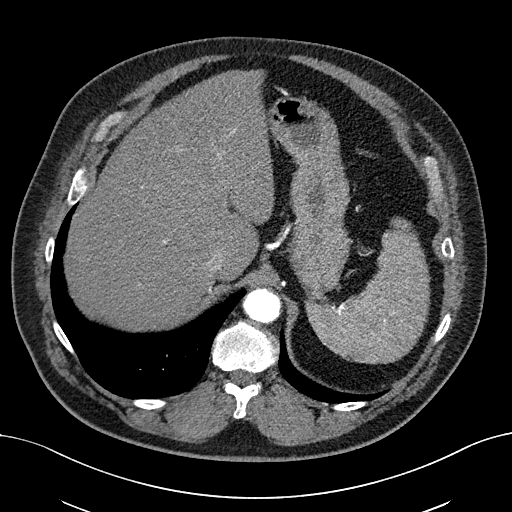
[im 67/80  soft-tissue]
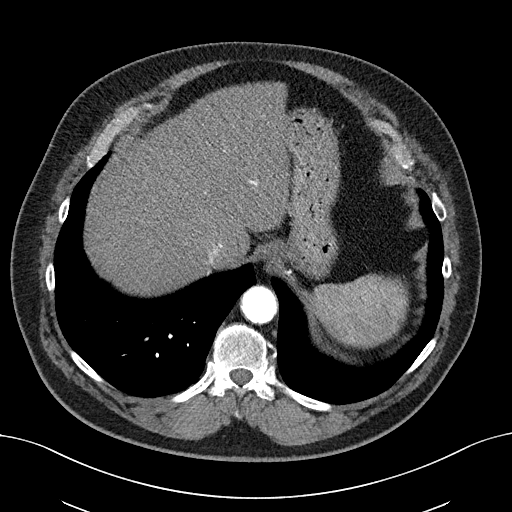
[im 67/80  bone]
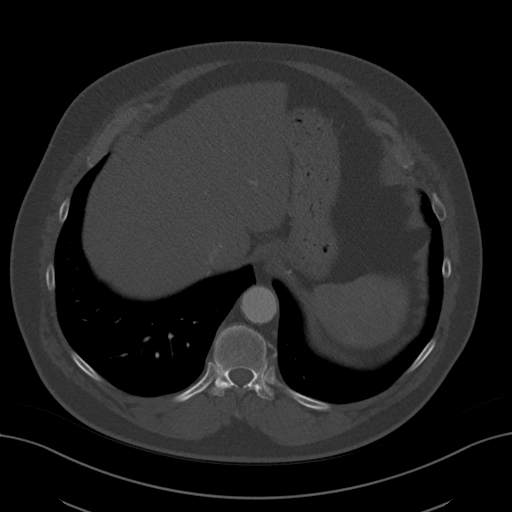
[im 73/80  soft-tissue]
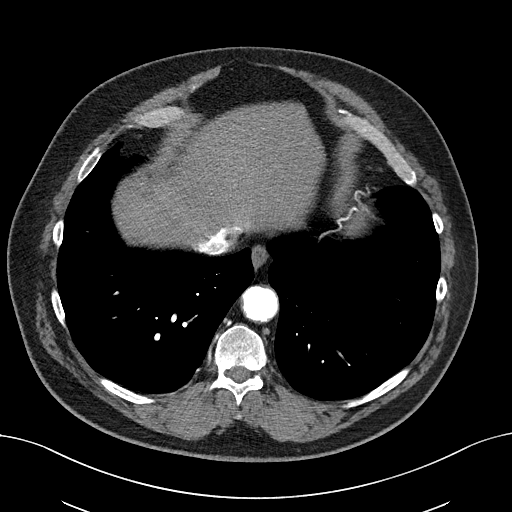

[Series 7: coronals · coronal · 0.47mm/px · 3 of 173 slices shown]
[im 44/173  soft-tissue]
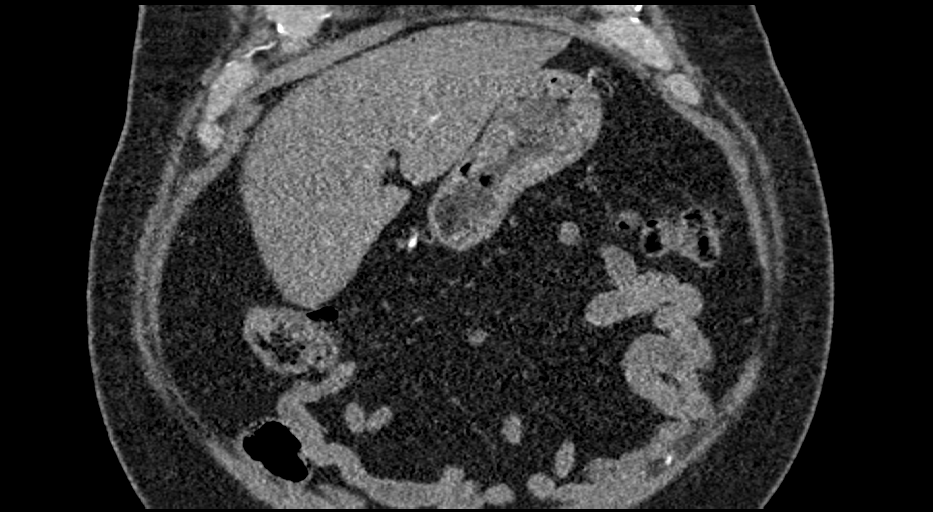
[im 87/173  soft-tissue]
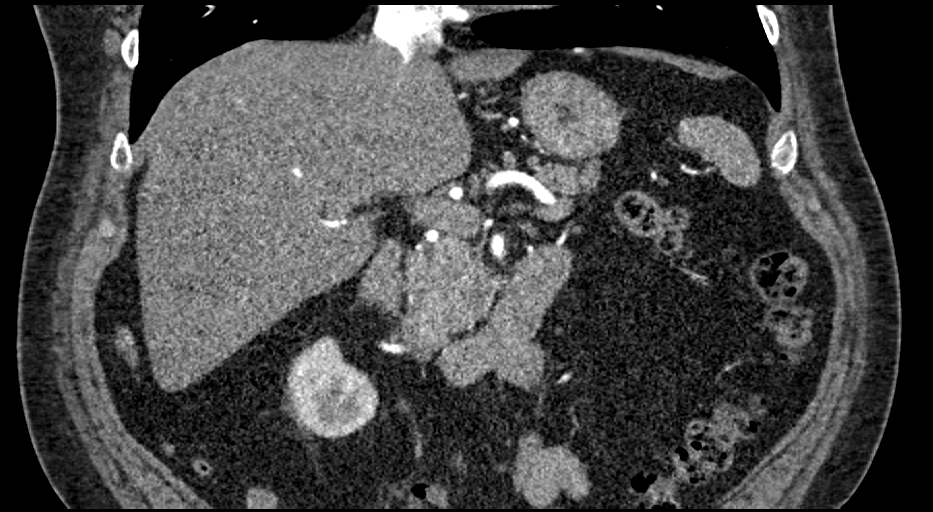
[im 130/173  soft-tissue]
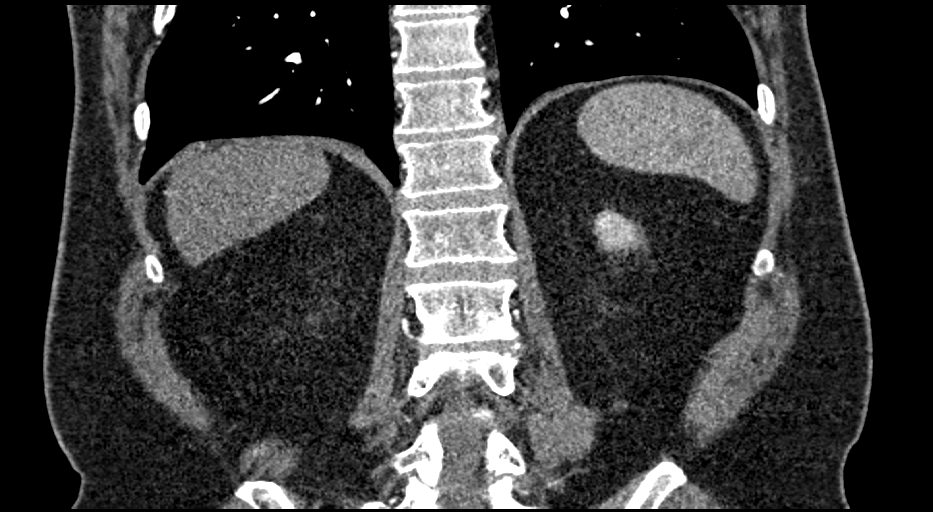

[13 of 46 positions shown; findings below may reference images not displayed]

FINDINGS: VASCULAR

Aorta: Moderate calcified atheromatous plaque. Aortic aneurysm with
juxtarenal moiety measuring 3.6 x3.2 cm maximum dimensions,
infrarenal moiety 3.7 x 3.3 cm, tapering to 3.1 cm at the
bifurcation. There is a small amount of eccentric nonocclusive mural
thrombus in the distal segment. No dissection or stenosis.

Celiac: Patent without evidence of aneurysm, dissection, vasculitis
or significant stenosis.

SMA: Minimal calcified plaque without stenosis. Replaced right
hepatic arterial supply, an anatomic variant.

Renals: On the left, there are 3 renal arteries. The superior is the
largest, supplying most of the upper pole. The middle is smaller,
supplies a portion of the anterior division and lower pole, with
short-segment ostial stenosis. The inferior arises at the same level
as the IMA, supplies the remainder of the lower pole, also with
short-segment ostial stenosis.

Duplicated right renal artery. The superior moiety is dominant,
patent. Inferior moiety supplies a portion of the lower pole, and
has short-segment ostial stenosis of possible hemodynamic
significance, patent distally.

IMA: Patent without evidence of aneurysm, dissection, vasculitis or
significant stenosis.

Inflow: Visualized proximal portions of common iliac arteries are
atheromatous without origin stenosis.

Veins: No obvious venous abnormality within the limitations of this
arterial phase study.

Review of the MIP images confirms the above findings.

NON-VASCULAR

Lower chest: Previous median sternotomy. No pleural or pericardial
effusion evident.

Hepatobiliary: No focal liver abnormality is seen. No gallstones,
gallbladder wall thickening, or biliary dilatation.

Pancreas: Unremarkable. No pancreatic ductal dilatation or
surrounding inflammatory changes.

Spleen: Normal in size without focal abnormality.

Adrenals/Urinary Tract: Adrenal glands are unremarkable. Kidneys are
normal, without renal calculi, focal lesion, or hydronephrosis.

Stomach/Bowel: Stomach and visualized portions of small bowel and
colon are nondilated, unremarkable. Normal appendix.

Lymphatic: No abdominal or mesenteric adenopathy.

Other: No ascites. No free air.

Musculoskeletal: Stable subcentimeter lucent lesion in the right
iliac wing. Previous median sternotomy. Stable benign hemangioma in
L4 vertebral body. Negative for fracture or worrisome bone lesion.
IMPRESSION: VASCULAR

1. 3.7 cm bilobed juxtarenal/infrarenal abdominal aortic aneurysm
(previously 3.2 cm). Recommend followup by ultrasound in 2 years.
This recommendation follows ACR consensus guidelines: White Paper of
the ACR Incidental Findings Committee II on Vascular Findings. [HOSPITAL] 3405; [DATE].
2. Duplicated right and 3 left renal arteries, with origin stenosis
as above.

NON-VASCULAR

1. No acute findings.
2. Stable postop  changes.

## 2020-09-17 ENCOUNTER — Encounter: Payer: Self-pay | Admitting: *Deleted

## 2020-10-22 NOTE — Telephone Encounter (Signed)
Spoke with pt wife, she reports the patient is now being seen at the [place] and will no longer be seeing [place]. Reminded her that he needs to get a CT scan to follow up on his aneurysm.

## 2021-02-27 ENCOUNTER — Other Ambulatory Visit (HOSPITAL_COMMUNITY): Payer: Self-pay

## 2021-05-11 ENCOUNTER — Telehealth: Payer: 59 | Admitting: Family

## 2021-05-11 DIAGNOSIS — Z87891 Personal history of nicotine dependence: Secondary | ICD-10-CM

## 2021-05-11 DIAGNOSIS — I10 Essential (primary) hypertension: Secondary | ICD-10-CM

## 2021-05-11 DIAGNOSIS — U071 COVID-19: Secondary | ICD-10-CM

## 2021-05-11 MED ORDER — ALBUTEROL SULFATE HFA 108 (90 BASE) MCG/ACT IN AERS: 2.0000 | INHALATION_SPRAY | Freq: Four times a day (QID) | RESPIRATORY_TRACT | 0 refills | Status: AC | PRN

## 2021-05-11 MED ORDER — MOLNUPIRAVIR EUA 200MG CAPSULE: 4.0000 | ORAL_CAPSULE | Freq: Two times a day (BID) | ORAL | 0 refills | Status: AC

## 2021-05-11 MED ORDER — BENZONATATE 100 MG PO CAPS: 100.0000 mg | ORAL_CAPSULE | Freq: Three times a day (TID) | ORAL | 0 refills | Status: AC | PRN

## 2021-05-11 NOTE — Progress Notes (Signed)
Virtual Visit Consent   ROGERIO BOUTELLE, you are scheduled for a virtual visit with a [facility] provider today.     Just as with appointments in the office, your consent must be obtained to participate.  Your consent will be active for this visit and any virtual visit you may have with one of our providers in the next 365 days.     If you have a MyChart account, a copy of this consent can be sent to you electronically.  All virtual visits are billed to your insurance company just like a traditional visit in the office.    As this is a virtual visit, video technology does not allow for your provider to perform a traditional examination.  This may limit your provider's ability to fully assess your condition.  If your provider identifies any concerns that need to be evaluated in person or the need to arrange testing (such as labs, EKG, etc.), we will make arrangements to do so.     Although advances in technology are sophisticated, we cannot ensure that it will always work on either your end or our end.  If the connection with a video visit is poor, the visit may have to be switched to a telephone visit.  With either a video or telephone visit, we are not always able to ensure that we have a secure connection.     I need to obtain your verbal consent now.   Are you willing to proceed with your visit today?    DIOGO ANNE has provided verbal consent on 05/11/2021 for a virtual visit (video or telephone).   Jannifer Rodney, FNP   Date: 05/11/2021 9:20 AM   Virtual Visit via Video Note   I, Jannifer Rodney, connected with  ISON WICHMANN  ([id number], [birth year]) on 05/11/21 at  9:00 AM EDT by a video-enabled telemedicine application and verified that I am speaking with the correct person using two identifiers.  Location: Patient: Virtual Visit Location Patient: Home Provider: Virtual Visit Location Provider: Home   I discussed the limitations of evaluation and management by telemedicine and  the availability of in person appointments. The patient expressed understanding and agreed to proceed.    History of Present Illness: [name] is a 70 y.o. who identifies as a male who was assigned male at birth, and is being seen today for COVID. He reports his symptoms started yesterday and tested positive today.   HPI: Cough This is a new problem. The current episode started yesterday. The problem has been gradually worsening. The problem occurs every few minutes. The cough is Non-productive. Associated symptoms include chills, a fever, headaches, myalgias, nasal congestion, postnasal drip and a sore throat. Pertinent negatives include no ear congestion, ear pain, shortness of breath or wheezing. The symptoms are aggravated by lying down. He has tried rest for the symptoms. The treatment provided mild relief.   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/09/2018   Antiplatelet or antithrombotic long-term use 04/25/2018   Abnormal stress test 01/06/2018   Status post bypass graft of extremity 10/20/2017   Atherosclerosis of native artery of both lower extremities with intermittent claudication (HCC) 06/18/2017   Peripheral arterial occlusive disease (HCC) 04/09/2017   Bruit 10/23/2015   Prediabetes 12/19/2014   GERD (gastroesophageal reflux disease) 12/19/2014   CLAUDICATION 09/04/2010   Hyperlipidemia LDL goal <70 08/12/2009   Essential hypertension 08/12/2009   Acute myocardial infarction [facility])  08/12/2009   CAD S/P percutaneous coronary angioplasty 08/12/2009   ISCHEMIC CARDIOMYOPATHY 08/12/2009   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   SYNCOPE 08/12/2009   Insomnia 08/12/2009    Allergies:  Allergies  Allergen Reactions   Lipitor [Atorvastatin Calcium]     Leg cramps   Neosporin [Neomycin-Bacitracin Zn-Polymyx] Dermatitis   Codeine Nausea And Vomiting   Medications:  Current Outpatient Medications:    albuterol (VENTOLIN HFA) 108 (90 Base)  MCG/ACT inhaler, Inhale 2 puffs into the lungs every 6 (six) hours as needed for wheezing or shortness of breath., Disp: 8 g, Rfl: 0   benzonatate (TESSALON PERLES) 100 MG capsule, Take 1 capsule (100 mg total) by mouth 3 (three) times daily as needed., Disp: 20 capsule, Rfl: 0   molnupiravir EUA 200 mg CAPS, Take 4 capsules (800 mg total) by mouth 2 (two) times daily for 5 days., Disp: 40 capsule, Rfl: 0   amLODipine (NORVASC) 10 MG tablet, TAKE 1 TABLET BY MOUTH DAILY., Disp: 90 tablet, Rfl: 2   aspirin EC 81 MG tablet, Take 81 mg by mouth daily., Disp: , Rfl:    cetirizine (ZYRTEC) 10 MG tablet, Take 10 mg by mouth daily. , Disp: , Rfl:    diphenhydramine-acetaminophen (TYLENOL PM) 25-500 MG TABS tablet, Take 2 tablets by mouth at bedtime., Disp: , Rfl:    ezetimibe (ZETIA) 10 MG tablet, TAKE 1 TABLET (10 MG TOTAL) BY MOUTH DAILY., Disp: 90 tablet, Rfl: 3   isosorbide mononitrate (IMDUR) 30 MG 24 hr tablet, TAKE 1 TABLET BY MOUTH DAILY., Disp: 90 tablet, Rfl: 2   losartan (COZAAR) 100 MG tablet, Take 1 tablet (100 mg total) by mouth every evening., Disp: 90 tablet, Rfl: 3   metoprolol succinate (TOPROL-XL) 25 MG 24 hr tablet, Take 1 tablet (25 mg total) by mouth daily., Disp: 90 tablet, Rfl: 3   nitroGLYCERIN (NITROSTAT) 0.4 MG SL tablet, Place 1 tablet (0.4 mg total) under the tongue every 5 (five) minutes as needed for chest pain., Disp: 25 tablet, Rfl: 2   pantoprazole (PROTONIX) 40 MG tablet, TAKE 1 TABLET BY MOUTH ONCE DAILY., Disp: 90 tablet, Rfl: 3   rosuvastatin (CRESTOR) 5 MG tablet, TAKE 1 TABLET (5 MG TOTAL) BY MOUTH DAILY., Disp: 90 tablet, Rfl: 3  Observations/Objective: Patient is well-developed, well-nourished in no acute distress.  Resting comfortably  at home.  Head is normocephalic, atraumatic.  No labored breathing.  Speech is clear and coherent with logical content.  Patient is alert and oriented at baseline.  Intermittent coarse cough Morbid obese  Assessment and  Plan: 1. COVID-19 virus detected - molnupiravir EUA 200 mg CAPS; Take 4 capsules (800 mg total) by mouth 2 (two) times daily for 5 days.  Dispense: 40 capsule; Refill: 0 - benzonatate (TESSALON PERLES) 100 MG capsule; Take 1 capsule (100 mg total) by mouth 3 (three) times daily as needed.  Dispense: 20 capsule; Refill: 0 - albuterol (VENTOLIN HFA) 108 (90 Base) MCG/ACT inhaler; Inhale 2 puffs into the lungs every 6 (six) hours as needed for wheezing or shortness of breath.  Dispense: 8 g; Refill: 0  2. Essential hypertension  3. Morbid obesity (HCC)  4. History of smoking COVID positive, rest, force fluids, tylenol as needed, Quarantine for at least 5 days and fever free and then wear mask for 5 days, report any worsening symptoms such as increased shortness of breath, swelling, or continued high fevers.  Possible adverse effects discussed   Follow Up Instructions: I discussed the assessment and treatment  plan with the patient. The patient was provided an opportunity to ask questions and all were answered. The patient agreed with the plan and demonstrated an understanding of the instructions.  A copy of instructions were sent to the patient via MyChart.  The patient was advised to call back or seek an in-person evaluation if the symptoms worsen or if the condition fails to improve as anticipated.  Time:  I spent 10 minutes with the patient via telehealth technology discussing the above problems/concerns.    Jannifer Rodney, FNP

## 2021-11-07 ENCOUNTER — Other Ambulatory Visit (HOSPITAL_COMMUNITY): Payer: Self-pay

## 2022-06-15 ENCOUNTER — Telehealth: Payer: Self-pay | Admitting: Emergency Medicine

## 2022-06-15 ENCOUNTER — Other Ambulatory Visit (HOSPITAL_COMMUNITY): Payer: Self-pay

## 2022-06-15 DIAGNOSIS — R112 Nausea with vomiting, unspecified: Secondary | ICD-10-CM

## 2022-06-15 MED ORDER — ONDANSETRON 4 MG PO TBDP
4.0000 mg | ORAL_TABLET | Freq: Three times a day (TID) | ORAL | 0 refills | Status: AC | PRN
  Filled 2022-06-15: qty 10, 4d supply, fill #0

## 2022-06-15 NOTE — Progress Notes (Signed)
Virtual Visit Consent   [name], you are scheduled for a virtual visit with a [facility] provider today. Just as with appointments in the office, your consent must be obtained to participate. Your consent will be active for this visit and any virtual visit you may have with one of our providers in the next 365 days. If you have a MyChart account, a copy of this consent can be sent to you electronically.  As this is a virtual visit, video technology does not allow for your provider to perform a traditional examination. This may limit your provider's ability to fully assess your condition. If your provider identifies any concerns that need to be evaluated in person or the need to arrange testing (such as labs, EKG, etc.), we will make arrangements to do so. Although advances in technology are sophisticated, we cannot ensure that it will always work on either your end or our end. If the connection with a video visit is poor, the visit may have to be switched to a telephone visit. With either a video or telephone visit, we are not always able to ensure that we have a secure connection.  By engaging in this virtual visit, you consent to the provision of healthcare and authorize for your insurance to be billed (if applicable) for the services provided during this visit. Depending on your insurance coverage, you may receive a charge related to this service.  I need to obtain your verbal consent now. Are you willing to proceed with your visit today? [name] has provided verbal consent on 06/15/2022 for a virtual visit (video or telephone). Dale Horseman, PA-C  Date: 06/15/2022 10:00 AM  Virtual Visit via Video Note   I, [name], connected with  [name]  ([id number], [birth year]) on 06/15/22 at 10:00 AM EDT by a video-enabled telemedicine application and verified that I am speaking with the correct person using two identifiers.  Location: Patient: Virtual Visit Location  Patient: Home Provider: Virtual Visit Location Provider: Home   I discussed the limitations of evaluation and management by telemedicine and the availability of in person appointments. The patient expressed understanding and agreed to proceed.    History of Present Illness: [name] is a 71 y.o. who identifies as a male who was assigned male at birth, and is being seen today for feeling bad.  States that he has been vomiting all night.  Tried taking a home COVID test that was negative.  Has been able to keep down some ice water and jello this morning.  Has tried taking Tylenol, but hasn't been able to keep it down.  Denies diarrhea, but hasn't gone since before his symptoms started.  States that his stomach has felt upset.  Reports subjective fevers, but states he doesn't have a thermometer.  Denies ever having had surgery on his stomach.  Denies any blood in vomit.  HPI: HPI  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/09/2018   Antiplatelet or antithrombotic long-term use 04/25/2018   Abnormal stress test 01/06/2018   Status post bypass graft of extremity 10/20/2017   Atherosclerosis of native artery of both lower extremities with intermittent claudication (HCC) 06/18/2017   Peripheral arterial occlusive disease (HCC) 04/09/2017   Bruit 10/23/2015   Prediabetes 12/19/2014   GERD (gastroesophageal reflux disease) 12/19/2014   CLAUDICATION 09/04/2010   Hyperlipidemia LDL goal <70 08/12/2009   Essential hypertension 08/12/2009   Acute myocardial infarction (  [place]) 08/12/2009   CAD S/P percutaneous coronary angioplasty 08/12/2009   ISCHEMIC CARDIOMYOPATHY 08/12/2009   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   SYNCOPE 08/12/2009   Insomnia 08/12/2009    Allergies:  Allergies  Allergen Reactions   Lipitor [Atorvastatin Calcium]     Leg cramps   Neosporin [Neomycin-Bacitracin Zn-Polymyx] Dermatitis   Codeine Nausea And Vomiting   Medications:  Current  Outpatient Medications:    albuterol (VENTOLIN HFA) 108 (90 Base) MCG/ACT inhaler, Inhale 2 puffs into the lungs every 6 (six) hours as needed for wheezing or shortness of breath., Disp: 8 g, Rfl: 0   amLODipine (NORVASC) 10 MG tablet, TAKE 1 TABLET BY MOUTH DAILY., Disp: 90 tablet, Rfl: 2   aspirin EC 81 MG tablet, Take 81 mg by mouth daily., Disp: , Rfl:    benzonatate (TESSALON PERLES) 100 MG capsule, Take 1 capsule (100 mg total) by mouth 3 (three) times daily as needed., Disp: 20 capsule, Rfl: 0   cetirizine (ZYRTEC) 10 MG tablet, Take 10 mg by mouth daily. , Disp: , Rfl:    diphenhydramine-acetaminophen (TYLENOL PM) 25-500 MG TABS tablet, Take 2 tablets by mouth at bedtime., Disp: , Rfl:    ezetimibe (ZETIA) 10 MG tablet, TAKE 1 TABLET (10 MG TOTAL) BY MOUTH DAILY., Disp: 90 tablet, Rfl: 3   isosorbide mononitrate (IMDUR) 30 MG 24 hr tablet, TAKE 1 TABLET BY MOUTH DAILY., Disp: 90 tablet, Rfl: 2   losartan (COZAAR) 100 MG tablet, Take 1 tablet (100 mg total) by mouth every evening., Disp: 90 tablet, Rfl: 3   metoprolol succinate (TOPROL-XL) 25 MG 24 hr tablet, Take 1 tablet (25 mg total) by mouth daily., Disp: 90 tablet, Rfl: 3   nitroGLYCERIN (NITROSTAT) 0.4 MG SL tablet, Place 1 tablet (0.4 mg total) under the tongue every 5 (five) minutes as needed for chest pain., Disp: 25 tablet, Rfl: 2   pantoprazole (PROTONIX) 40 MG tablet, TAKE 1 TABLET BY MOUTH ONCE DAILY., Disp: 90 tablet, Rfl: 3   rosuvastatin (CRESTOR) 5 MG tablet, TAKE 1 TABLET (5 MG TOTAL) BY MOUTH DAILY., Disp: 90 tablet, Rfl: 3  Observations/Objective: Patient is well-developed, well-nourished in no acute distress.  Resting comfortably at home.  Head is normocephalic, atraumatic.  No labored breathing.  Speech is clear and coherent with logical content.  Patient is alert and oriented at baseline.    Assessment and Plan: 1. Nausea and vomiting, unspecified vomiting type  Onset of n/v was last night.  No significant  abdominal pain or measured fever.  Nonbloody vomit.  Thought viral at this point, but strict return/fu precautions given.  Trial Zofran for symptom relief.  Follow Up Instructions: I discussed the assessment and treatment plan with the patient. The patient was provided an opportunity to ask questions and all were answered. The patient agreed with the plan and demonstrated an understanding of the instructions.  A copy of instructions were sent to the patient via MyChart unless otherwise noted below.     The patient was advised to call back or seek an in-person evaluation if the symptoms worsen or if the condition fails to improve as anticipated.  Time:  I spent 11 minutes with the patient via telehealth technology discussing the above problems/concerns.    Montine Circle, PA-C

## 2022-06-15 NOTE — Patient Instructions (Signed)
If your symptoms change or worsen, you need to be seen in person.  Take medication as directed.

## 2022-07-16 ENCOUNTER — Other Ambulatory Visit (HOSPITAL_COMMUNITY): Payer: Self-pay

## 2022-09-17 ENCOUNTER — Other Ambulatory Visit (HOSPITAL_COMMUNITY): Payer: Self-pay

## 2023-07-08 ENCOUNTER — Other Ambulatory Visit (HOSPITAL_COMMUNITY): Payer: Self-pay
